# Patient Record
Sex: Female | Born: 1945 | Race: White | Hispanic: No | Smoking: Former smoker
Health system: Southern US, Community
[De-identification: ages and names within clinical notes are randomized; demographics above are authoritative.]

## PROBLEM LIST (undated history)

## (undated) DIAGNOSIS — E785 Hyperlipidemia, unspecified: Secondary | ICD-10-CM

## (undated) DIAGNOSIS — H409 Unspecified glaucoma: Secondary | ICD-10-CM

## (undated) DIAGNOSIS — J45909 Unspecified asthma, uncomplicated: Secondary | ICD-10-CM

## (undated) DIAGNOSIS — G4733 Obstructive sleep apnea (adult) (pediatric): Secondary | ICD-10-CM

## (undated) DIAGNOSIS — J449 Chronic obstructive pulmonary disease, unspecified: Secondary | ICD-10-CM

## (undated) DIAGNOSIS — M810 Age-related osteoporosis without current pathological fracture: Secondary | ICD-10-CM

## (undated) DIAGNOSIS — K219 Gastro-esophageal reflux disease without esophagitis: Secondary | ICD-10-CM

## (undated) DIAGNOSIS — I10 Essential (primary) hypertension: Secondary | ICD-10-CM

## (undated) DIAGNOSIS — R011 Cardiac murmur, unspecified: Secondary | ICD-10-CM

## (undated) DIAGNOSIS — H3530 Unspecified macular degeneration: Secondary | ICD-10-CM

## (undated) HISTORY — DX: Hyperlipidemia, unspecified: E78.5

## (undated) HISTORY — PX: GLAUCOMA SURGERY: SHX656

## (undated) HISTORY — PX: NASAL SINUS SURGERY: SHX719

## (undated) HISTORY — DX: Essential (primary) hypertension: I10

## (undated) HISTORY — DX: Unspecified glaucoma: H40.9

## (undated) HISTORY — PX: VESICOVAGINAL FISTULA CLOSURE W/ TAH: SUR271

## (undated) HISTORY — DX: Obstructive sleep apnea (adult) (pediatric): G47.33

## (undated) HISTORY — PX: ABDOMINAL HYSTERECTOMY: SHX81

## (undated) HISTORY — PX: BREAST CYST ASPIRATION: SHX578

---

## 2004-05-11 ENCOUNTER — Other Ambulatory Visit: Payer: Self-pay

## 2004-08-27 ENCOUNTER — Ambulatory Visit: Payer: Self-pay | Admitting: General Surgery

## 2005-04-09 ENCOUNTER — Ambulatory Visit: Payer: Self-pay | Admitting: Family Medicine

## 2005-10-15 ENCOUNTER — Ambulatory Visit: Payer: Self-pay | Admitting: Unknown Physician Specialty

## 2006-01-29 ENCOUNTER — Other Ambulatory Visit: Payer: Self-pay

## 2006-02-06 ENCOUNTER — Ambulatory Visit: Payer: Self-pay | Admitting: Otolaryngology

## 2006-05-16 ENCOUNTER — Ambulatory Visit: Payer: Self-pay | Admitting: Family Medicine

## 2006-10-23 ENCOUNTER — Ambulatory Visit: Payer: Self-pay | Admitting: Internal Medicine

## 2007-04-18 ENCOUNTER — Emergency Department: Payer: Self-pay | Admitting: Emergency Medicine

## 2007-06-01 ENCOUNTER — Ambulatory Visit: Payer: Self-pay | Admitting: Family Medicine

## 2008-06-02 ENCOUNTER — Ambulatory Visit: Payer: Self-pay | Admitting: Family Medicine

## 2009-01-26 DIAGNOSIS — I10 Essential (primary) hypertension: Secondary | ICD-10-CM | POA: Insufficient documentation

## 2009-01-26 DIAGNOSIS — R05 Cough: Secondary | ICD-10-CM | POA: Insufficient documentation

## 2009-01-26 DIAGNOSIS — R059 Cough, unspecified: Secondary | ICD-10-CM | POA: Insufficient documentation

## 2009-01-27 ENCOUNTER — Ambulatory Visit: Payer: Self-pay | Admitting: Internal Medicine

## 2009-01-31 ENCOUNTER — Ambulatory Visit: Payer: Self-pay | Admitting: Unknown Physician Specialty

## 2009-03-20 ENCOUNTER — Ambulatory Visit: Payer: Self-pay | Admitting: Internal Medicine

## 2009-03-20 DIAGNOSIS — R0989 Other specified symptoms and signs involving the circulatory and respiratory systems: Secondary | ICD-10-CM

## 2009-03-20 DIAGNOSIS — R0609 Other forms of dyspnea: Secondary | ICD-10-CM | POA: Insufficient documentation

## 2009-03-21 ENCOUNTER — Telehealth (INDEPENDENT_AMBULATORY_CARE_PROVIDER_SITE_OTHER): Payer: Self-pay | Admitting: *Deleted

## 2009-04-27 ENCOUNTER — Ambulatory Visit: Payer: Self-pay | Admitting: Internal Medicine

## 2009-04-27 DIAGNOSIS — K219 Gastro-esophageal reflux disease without esophagitis: Secondary | ICD-10-CM | POA: Insufficient documentation

## 2009-06-08 ENCOUNTER — Ambulatory Visit: Payer: Self-pay | Admitting: Family Medicine

## 2010-06-12 ENCOUNTER — Ambulatory Visit: Payer: Self-pay | Admitting: Family Medicine

## 2010-07-04 ENCOUNTER — Ambulatory Visit: Payer: Self-pay | Admitting: Family Medicine

## 2010-07-31 ENCOUNTER — Other Ambulatory Visit: Payer: Self-pay

## 2010-08-01 ENCOUNTER — Other Ambulatory Visit: Payer: Self-pay

## 2010-08-07 ENCOUNTER — Ambulatory Visit: Payer: Self-pay | Admitting: Unknown Physician Specialty

## 2010-10-04 ENCOUNTER — Ambulatory Visit: Payer: Self-pay | Admitting: Family Medicine

## 2011-01-21 IMAGING — CR DG LUMBAR SPINE COMPLETE 4+V
1 series · 5 of 5 positions shown · non-contrast
Comparison: none

REASON FOR EXAM: low back pain left hip pain
COMMENTS:

[Series 2: view not recorded · 0.17mm/px · 5 of 5 slices shown]
[im 1/5]
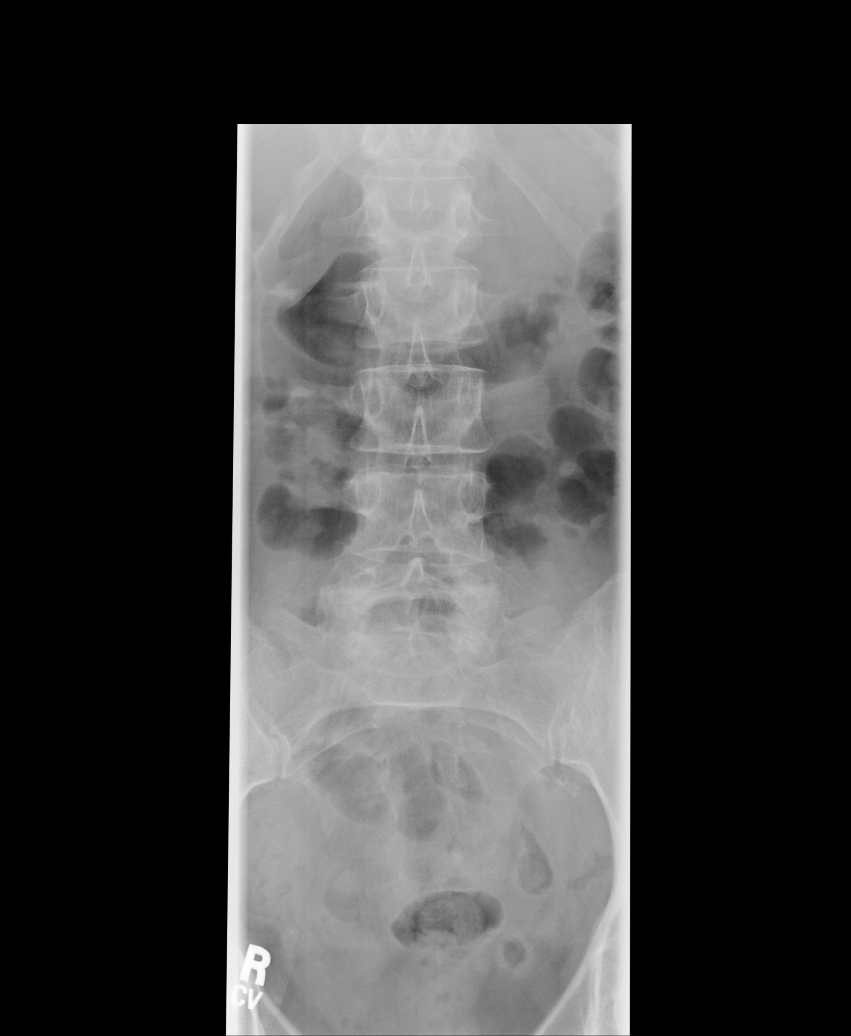
[im 2/5]
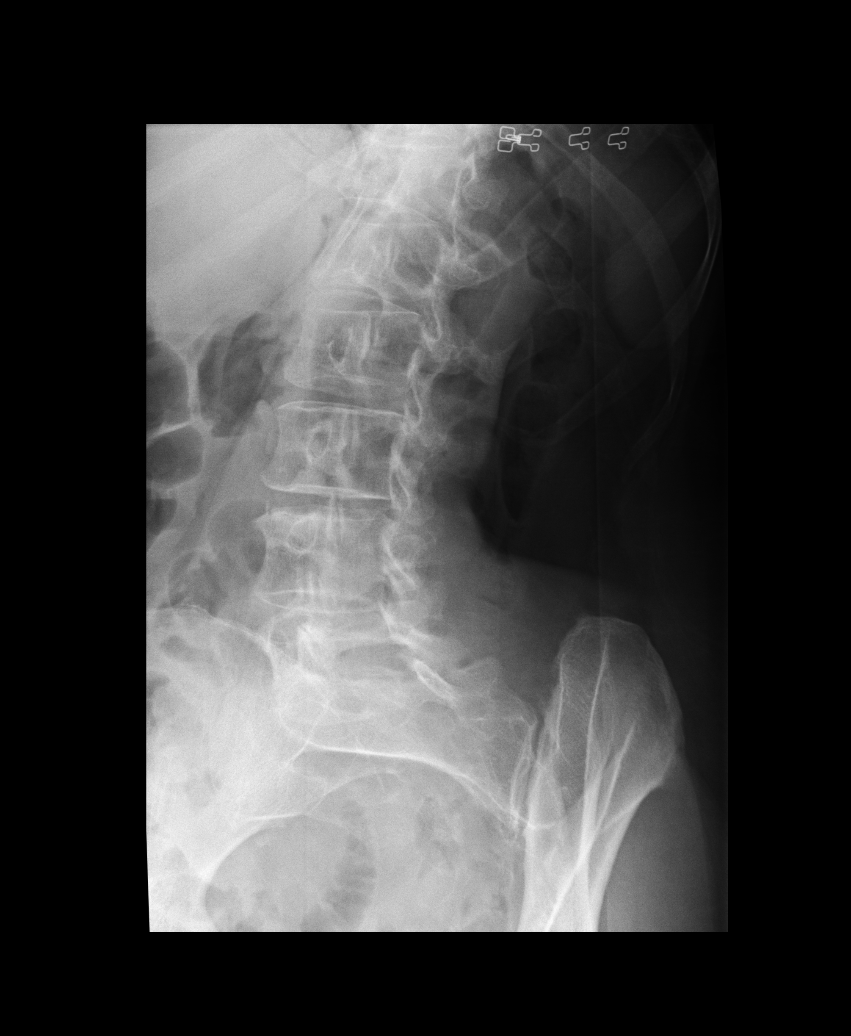
[im 3/5]
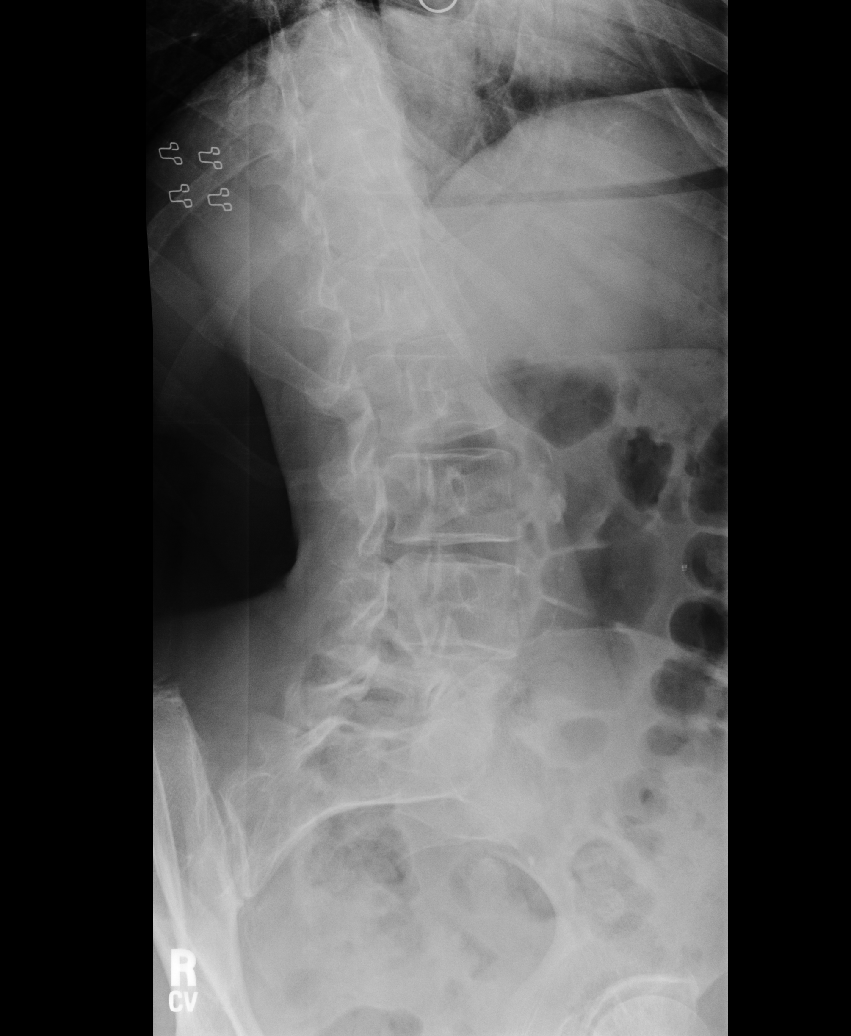
[im 4/5]
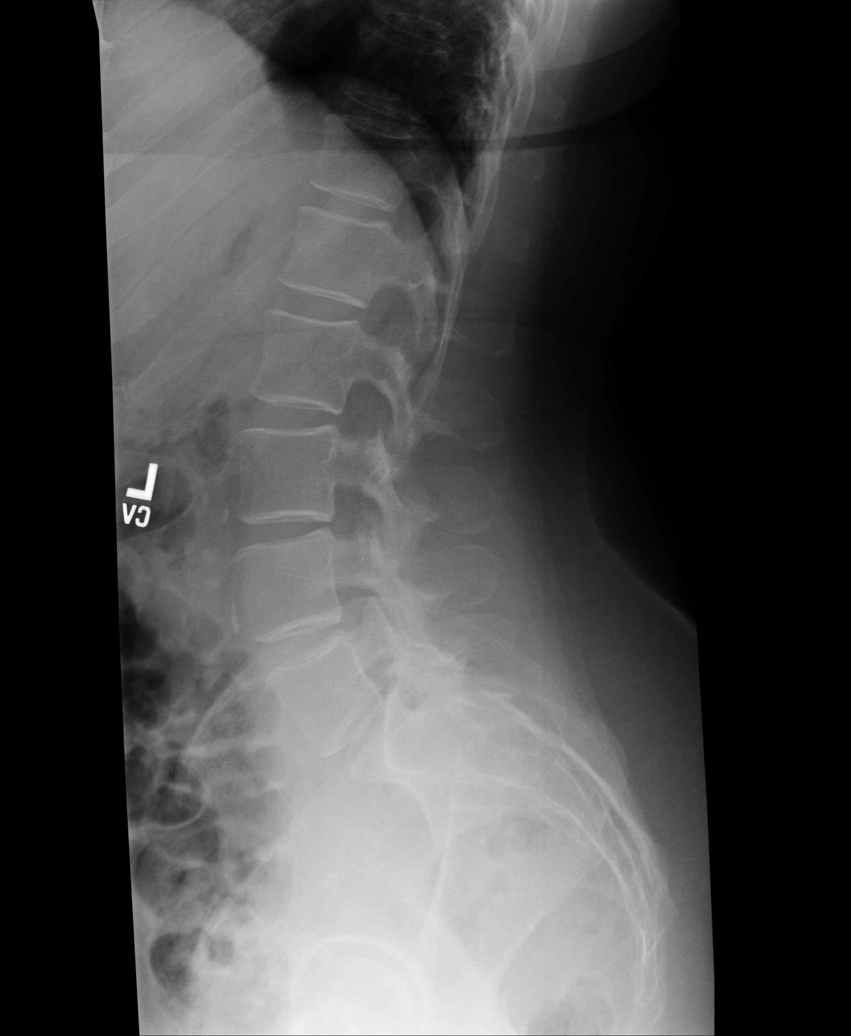
[im 5/5]
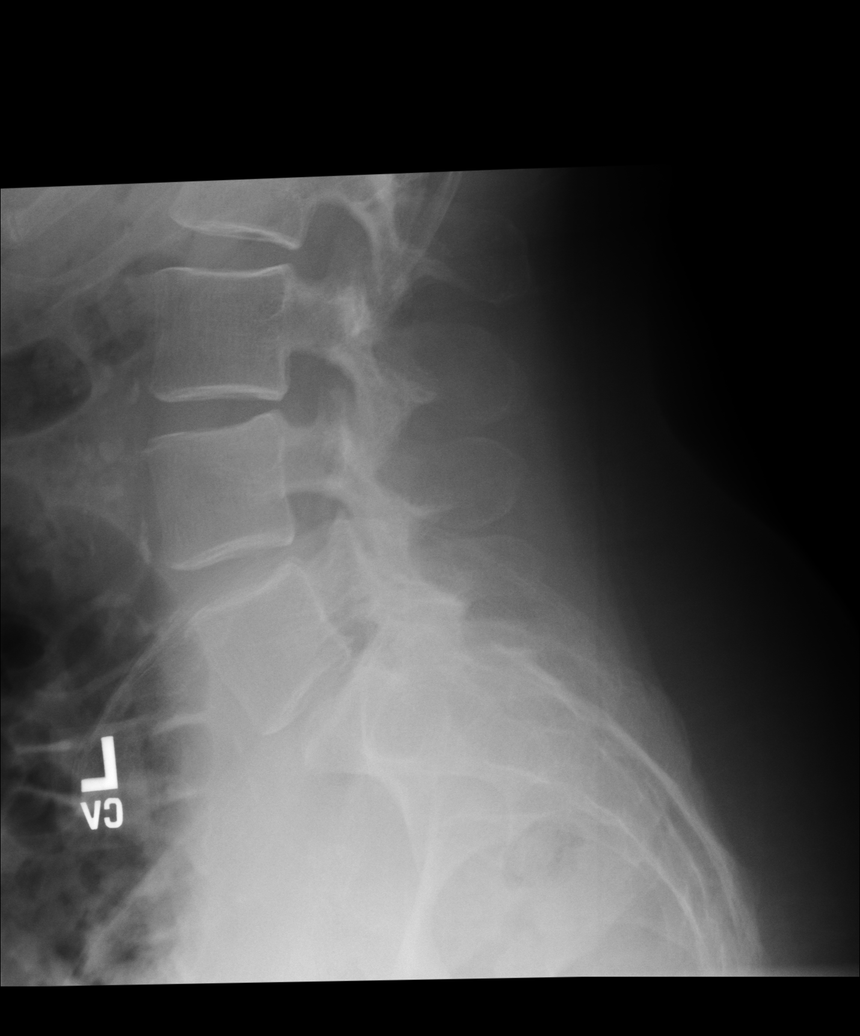

[5 of 5 positions shown; findings below may reference images not displayed]

PROCEDURE:     KDR - KDXR LUMBAR SPINE WITH OBLIQUES  - July 04, 2010 [DATE]

RESULT:     The vertebral body heights and the intervertebral disc spaces
are well maintained. Vertebral body alignment is normal. Oblique view shows
no significant abnormalities of the articular facets. The pedicles are
bilaterally intact.
IMPRESSION: No acute changes are identified.

## 2011-03-22 ENCOUNTER — Ambulatory Visit: Payer: Self-pay | Admitting: Otolaryngology

## 2011-04-05 NOTE — Assessment & Plan Note (Signed)
[facility]                             PULMONARY OFFICE NOTE   Sonya Gaines, Sonya Gaines                        MRN:          [id number]  DATE:10/23/2006                            DOB:          1946/06/01    HISTORY:  A 65 year old white female who quit smoking in 1992 and denies  having any respiratory complaints and a weight of 125 pounds.  However,  over the last 5 years she has had persistent cough on almost a daily  basis, worse in the mornings, productive of several teaspoons to a  tablespoon of green sputum that only improves a little bit from  antibiotics, and she is becoming intolerant to multiple antibiotics at  this point.  The most recent antibiotic she took was clindamycin; she  said which cleared up her head, but not her lungs.  When she is not  coughing she denies any significant dyspnea.  She rarely wakes up at  night with any cough or subjective wheeze.  She does notice the cough is  worse with weather changes when she is exposed to heavy fumes such as  perfume.   She denies any overt reflux symptoms, but has been told she might have  occult reflux, and has been taking Nexium for this without any apparent  benefit.   PAST MEDICAL HISTORY:  Significant for hypertension (note, not on ACE  inhibitors), multiple sinus surgeries, the most recent in 2006, and  hysterectomy in 1975.   ALLERGIES:  1. BIAXIN.  2. LEVAQUIN.  3. PENICILLIN.  4. SULFA.  None of these were specific reactions that she remember. She says she  has been able to tolerate Tequin, but it quit working after a while.   MEDICATIONS:  Nasacort, Benicar, Zetia, Nexium, Evista, vitamin E,  calcium, Advair, Fosamax.  For a full inventory with dosing, please see  face dated October 23, 2006.   SOCIAL HISTORY:  She quit smoking in 1992.  She denied any respiratory  complaints then.  She is retired and has no unusual travel, hobby, or  exposure history.   FAMILY HISTORY:   Family history is recorded in detail, and significant  for asthma in her brother, emphysema in her father, who was a smoker.   REVIEW OF SYSTEMS:  Taken in detail on the worksheet, and significant  for problems outlined above.   PHYSICAL EXAMINATION:  This is a pleasant ambulatory white female who  prefers to answer questions with medical diagnosis rather than specific  symptoms.  HEENT:  Unremarkable.  Oropharynx is clear.  Nose:  There is no  excessive postnasal drainage or cobblestoning.  There is minimal  turbinate edema.  Ear canals and tympanic membranes are clear.  NECK:  Supple without cervical adenopathy or tenderness.  Trachea is  midline.  No lymphadenopathy.  LUNG FIELDS:  Reveal prominent pseudo wheeze with junky rhonchi on FVC  maneuver only.  There is a regular rhythm without murmur, gallop or rub  present.  ABDOMEN:  Soft and benign with no palpable organomegaly, masses or  tenderness.  EXTREMITIES:  Warm without calf tenderness,  cyanosis or clubbing.   Chest x-ray is pending.   IMPRESSION:  Chronic cough associated typically exacerbated in the  morning with persistent purulent sputum.  It is suggestive of a  mucociliary dysfunction with or without sinusitis with postnasal drip  syndrome.  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   To sort through the differential, I recommended the following:  1. First increase Nasacort to b.i.d. to reduce nasal inflammation.  2. Continue to take Nexium, but take it 30 to 60 minutes before meals      and follow a GERD diet strictly to see to what extent reflux is      playing a  roll in terms of any of her symptoms.  For the same      reason I would like her to stop Fosamax for the next 6 weeks and      stop Advair also (Advair may be helping the lower airways in terms      of inflammation, but may irritate the upper airways and make them      more sensitive to what would otherwise be unremarkable postnasal      drainage.)   To replace Advair, I spent time teaching her how to use Symbicort 84/4  mg and asked her to take 2 puffs b.i.d.   Because she has green sputum, I recommended Avelox for 5 days only, and  call me back if the green sputum is still there.  Because she has  coughing and rattling suggestive of acquired mucociliary dysfunction, I  recommended a mucolytic in the form of Mucinex DM 2 b.i.d. Follow up  will be in 6 weeks, sooner if needed.     Charlaine Dalton. Sherene Sires, MD, [facility]  Electronically Signed    MBW/MedQ  DD: 10/23/2006  DT: 10/23/2006  Job #: [id number]   cc:   Gertie Baron, MD

## 2011-06-19 ENCOUNTER — Ambulatory Visit: Payer: Self-pay | Admitting: Otolaryngology

## 2011-08-06 ENCOUNTER — Ambulatory Visit: Payer: Self-pay | Admitting: Family Medicine

## 2012-10-12 ENCOUNTER — Ambulatory Visit: Payer: Self-pay | Admitting: Internal Medicine

## 2013-09-27 ENCOUNTER — Ambulatory Visit: Payer: Self-pay | Admitting: Unknown Physician Specialty

## 2013-10-13 ENCOUNTER — Ambulatory Visit: Payer: Self-pay | Admitting: Internal Medicine

## 2013-10-28 DIAGNOSIS — I10 Essential (primary) hypertension: Secondary | ICD-10-CM | POA: Diagnosis present

## 2014-12-27 ENCOUNTER — Ambulatory Visit: Payer: Self-pay | Admitting: Internal Medicine

## 2015-01-25 ENCOUNTER — Ambulatory Visit (INDEPENDENT_AMBULATORY_CARE_PROVIDER_SITE_OTHER)
Admission: RE | Admit: 2015-01-25 | Discharge: 2015-01-25 | Disposition: A | Discharge status: Home / Self Care | Payer: Medicare PPO | Source: Ambulatory Visit | Attending: Internal Medicine | Admitting: Internal Medicine

## 2015-01-25 ENCOUNTER — Ambulatory Visit (INDEPENDENT_AMBULATORY_CARE_PROVIDER_SITE_OTHER): Payer: Medicare PPO | Admitting: Internal Medicine

## 2015-01-25 ENCOUNTER — Encounter (INDEPENDENT_AMBULATORY_CARE_PROVIDER_SITE_OTHER): Payer: Self-pay

## 2015-01-25 ENCOUNTER — Encounter: Payer: Self-pay | Admitting: Internal Medicine

## 2015-01-25 VITALS — BP 140/70 | HR 70 | Ht 62.0 in | Wt 121.0 lb

## 2015-01-25 DIAGNOSIS — J45991 Cough variant asthma: Secondary | ICD-10-CM

## 2015-01-25 MED ORDER — BUDESONIDE-FORMOTEROL FUMARATE 80-4.5 MCG/ACT IN AERO: INHALATION_SPRAY | RESPIRATORY_TRACT | Status: DC

## 2015-01-25 NOTE — Progress Notes (Signed)
Subjective:    Patient ID: Sonya Gaines, female    DOB: 05/01/1946,    MRN: [id number]  HPI  58 yowf   quit smoking in 1992 with no problems at that time but chronic daily cough started around 2002.    10/23/06 initial pulmonary eval dx uacs vs asthma rec stop advair try symbicort rx ppi does not recall improvement and no follow up because she thought the problem was all sinus related and f/u with ENT.  January 27, 2009 ov for cough daily while on advair (did help some) worst first thing in am with mucus which is clear x tsp or two, occ slt yellow never bloody wiht mild doe but tires easily and not aerobic. rec nexium Take one 30-60 min before first meal of the day  stop lisinopril take Diovan 160 one daily  cough went away but wheezing came back on advair and continue some every am but doesn't wake her up prematurely.   Mar 20, 2009 some better overall but wheeze and sob continue on advair so changed to symbicort 160  April 27, 2009 ov doing so much better decreased symbicort to 160 1-2 two times a day as needed. no limiting sob, no cough. rec   01/25/2015 1st [facility] office visit [facility]/ Kipper Buch   Chief Complaint  Patient presents with  . Follow-up    Pt last seen here June 2010.  Pt c/o increased wheezing and cough for the past year.  She states occ produces some clear to yellow sputum.    does fine when takes symbicort, has cough / wheeze/congestion and   when she misses a lot of doses Concerned about glaucoma effects of symbicort.  No obvious other patterns in day to day or daytime variabilty or assoc chronic cough or cp or chest tightness, subjective wheeze overt sinus or hb symptoms. No unusual exp hx or h/o childhood pna/ asthma or knowledge of premature birth.  Sleeping ok without nocturnal  or early am exacerbation  of respiratory  c/o's or need for noct saba. Also denies any obvious fluctuation of symptoms with weather or environmental changes or other  aggravating or alleviating factors except as outlined above   Current Medications, Allergies, Complete Past Medical History, Past Surgical History, Family History, and Social History were reviewed in [employer] record.               Past Medical History: HYPERTENSION (ICD-401.9)  - ACE d/c 01/27/09 (cough) COUGH, CHRONIC (ICD-786.2) ASTHMA  - PFT's nl after one puff symbicort 160 April 27, 2009   - HFA 50% April 27, 2009   Family History  Asthma- Brother Emphysema- Father (who was a smoker)  Social History: Reviewed history from 01/26/2009 and no changes required. Former smoker. Quit in 1992. Retired                       Review of Systems  Constitutional: Negative for fever, chills and unexpected weight change.  HENT: Positive for congestion. Negative for dental problem, ear pain, nosebleeds, postnasal drip, rhinorrhea, sinus pressure, sneezing, sore throat, trouble swallowing and voice change.   Eyes: Negative for visual disturbance.  Respiratory: Positive for cough and shortness of breath. Negative for choking.   Cardiovascular: Negative for chest pain and leg swelling.  Gastrointestinal: Negative for vomiting, abdominal pain and diarrhea.  Genitourinary: Negative for difficulty urinating.  Musculoskeletal: Negative for arthralgias.  Skin: Negative for rash.  Neurological: Negative for  tremors, syncope and headaches.  Hematological: Does not bruise/bleed easily.       Objective:   Physical Exam  Additional Exam: wt 152 January 27, 2009 > 153 April 27, 2009> 01/25/15 121   In general healthy appearing  / slt hoarse HEENT: nl dentition, turbinates, and orophanx. Nl external ear canals without cough reflex NECK : without JVD/Nodes/TM/ nl carotid upstrokes bilaterally LUNGS: no acc muscle use, trace bilateral exp rhonchi worse on FVC maneuver  CV: RRR no s3 or murmur or increase in P2, no edema . ABD: soft  and nontender with nl excursion in the supine position. No bruits or organomegaly, bowel sounds nl MS: warm without deformities, calf tenderness, cyanosis or clubbing SKIN: warm and dry without lesions    CXR PA and Lateral:   01/25/2015 :     I personally reviewed images and agree with radiology impression as follows:     No acute cardiopulmonary disease Assessment & Plan:

## 2015-01-25 NOTE — Patient Instructions (Signed)
symbicort 80 Take 2 puffs first thing in am and then another 2 puffs about 12 hours later.   GERD (REFLUX)  is an extremely common cause of respiratory symptoms just like yours , many times with no obvious heartburn at all.    It can be treated with medication, but also with lifestyle changes including avoidance of late meals, excessive alcohol, smoking cessation, and avoid fatty foods, chocolate, peppermint, colas, red wine, and acidic juices such as orange juice.  NO MINT OR MENTHOL PRODUCTS SO NO COUGH DROPS  USE SUGARLESS CANDY INSTEAD (Jolley ranchers or Stover's or Life Savers) or even ice chips will also do - the key is to swallow to prevent all throat clearing. NO OIL BASED VITAMINS - use powdered substitutes.    Please remember to go to the   x-ray department downstairs for your tests - we will call you with the results when they are available.  Please schedule a follow up office visit in 6 weeks, call sooner if needed with pfts

## 2015-01-25 NOTE — Progress Notes (Signed)
Quick Note:  LMTCB ______ 

## 2015-01-26 ENCOUNTER — Telehealth: Payer: Self-pay | Admitting: Internal Medicine

## 2015-01-26 ENCOUNTER — Encounter: Payer: Self-pay | Admitting: Internal Medicine

## 2015-01-26 NOTE — Telephone Encounter (Signed)
Pt informed of cxr results per Dr Melvyn Novas

## 2015-01-26 NOTE — Assessment & Plan Note (Signed)
We know her pfts were previously nl on symbiocort so this is not copd but rather low grade chronic asthma that has been difficult to control  DDX of  difficult airways management all start with A and  include Adherence, Ace Inhibitors, Acid Reflux, Active Sinus Disease, Alpha 1 Antitripsin deficiency, Anxiety masquerading as Airways dz,  ABPA,  allergy(esp in young), Aspiration (esp in elderly), Adverse effects of DPI,  Active smokers, plus two Bs  = Bronchiectasis and Beta blocker use..and one C= CHF  Adherence is always the initial "prime suspect" and is a multilayered concern that requires a "trust but verify" approach in every patient - starting with knowing how to use medications, especially inhalers, correctly, keeping up with refills and understanding the fundamental difference between maintenance and prns vs those medications only taken for a very short course and then stopped and not refilled.  .-  The proper method of use, as well as anticipated side effects, of a metered-dose inhaler are discussed and demonstrated to the patient. Improved effectiveness after extensive coaching during this visit to a level of approximately  75 % so try symbicort 80 2bid   ? Acid (or non-acid) GERD > always difficult to exclude as up to 75% of pts in some series report no assoc GI/ Heartburn symptoms> she is on fosfamax and ppi which may be problematic and if develops refractory cough/ hoarseness would consider reclast IV yearly here/ reviewed diet   ? BB effect > on timolol eyedrops, a potential concern  Discussed in detail all the  indications, usual  risks and alternatives  relative to the benefits with patient who agrees to rx a follows:  For now will leave her on the symbicort 80 2bid and recheck pfts

## 2015-03-09 ENCOUNTER — Ambulatory Visit: Payer: Medicare PPO | Admitting: Internal Medicine

## 2015-11-11 ENCOUNTER — Emergency Department
Admission: EM | Admit: 2015-11-11 | Discharge: 2015-11-11 | Disposition: A | Discharge status: Home / Self Care | Payer: Medicare PPO | Attending: Emergency Medicine | Admitting: Emergency Medicine

## 2015-11-11 ENCOUNTER — Encounter: Payer: Self-pay | Admitting: Emergency Medicine

## 2015-11-11 DIAGNOSIS — J069 Acute upper respiratory infection, unspecified: Secondary | ICD-10-CM | POA: Diagnosis not present

## 2015-11-11 DIAGNOSIS — Z880 Allergy status to penicillin: Secondary | ICD-10-CM | POA: Diagnosis not present

## 2015-11-11 DIAGNOSIS — Z79899 Other long term (current) drug therapy: Secondary | ICD-10-CM | POA: Diagnosis not present

## 2015-11-11 DIAGNOSIS — R05 Cough: Secondary | ICD-10-CM | POA: Diagnosis present

## 2015-11-11 DIAGNOSIS — Z791 Long term (current) use of non-steroidal anti-inflammatories (NSAID): Secondary | ICD-10-CM | POA: Diagnosis not present

## 2015-11-11 DIAGNOSIS — I10 Essential (primary) hypertension: Secondary | ICD-10-CM | POA: Diagnosis not present

## 2015-11-11 DIAGNOSIS — J029 Acute pharyngitis, unspecified: Secondary | ICD-10-CM | POA: Diagnosis not present

## 2015-11-11 DIAGNOSIS — Z87891 Personal history of nicotine dependence: Secondary | ICD-10-CM | POA: Insufficient documentation

## 2015-11-11 LAB — POCT RAPID STREP A: Streptococcus, Group A Screen (Direct): NEGATIVE

## 2015-11-11 MED ORDER — CEPHALEXIN 500 MG PO CAPS: 500.0000 mg | ORAL_CAPSULE | Freq: Four times a day (QID) | ORAL | Status: AC

## 2015-11-11 NOTE — ED Notes (Signed)
C/o cough congestion and sore throat x 2 days

## 2015-11-11 NOTE — Discharge Instructions (Signed)
Pharyngitis °Pharyngitis is redness, pain, and swelling (inflammation) of your pharynx.  °CAUSES  °Pharyngitis is usually caused by infection. Most of the time, these infections are from viruses (viral) and are part of a cold. However, sometimes pharyngitis is caused by bacteria (bacterial). Pharyngitis can also be caused by allergies. Viral pharyngitis may be spread from person to person by coughing, sneezing, and personal items or utensils (cups, forks, spoons, toothbrushes). Bacterial pharyngitis may be spread from person to person by more intimate contact, such as kissing.  °SIGNS AND SYMPTOMS  °Symptoms of pharyngitis include:   °· Sore throat.   °· Tiredness (fatigue).   °· Low-grade fever.   °· Headache. °· Joint pain and muscle aches. °· Skin rashes. °· Swollen lymph nodes. °· Plaque-like film on throat or tonsils (often seen with bacterial pharyngitis). °DIAGNOSIS  °Your health care provider will ask you questions about your illness and your symptoms. Your medical history, along with a physical exam, is often all that is needed to diagnose pharyngitis. Sometimes, a rapid strep test is done. Other lab tests may also be done, depending on the suspected cause.  °TREATMENT  °Viral pharyngitis will usually get better in 3-4 days without the use of medicine. Bacterial pharyngitis is treated with medicines that kill germs (antibiotics).  °HOME CARE INSTRUCTIONS  °· Drink enough water and fluids to keep your urine clear or pale yellow.   °· Only take over-the-counter or prescription medicines as directed by your health care provider:   °· If you are prescribed antibiotics, make sure you finish them even if you start to feel better.   °· Do not take aspirin.   °· Get lots of rest.   °· Gargle with 8 oz of salt water (½ tsp of salt per 1 qt of water) as often as every 1-2 hours to soothe your throat.   °· Throat lozenges (if you are not at risk for choking) or sprays may be used to soothe your throat. °SEEK MEDICAL  CARE IF:  °· You have large, tender lumps in your neck. °· You have a rash. °· You cough up green, yellow-brown, or bloody spit. °SEEK IMMEDIATE MEDICAL CARE IF:  °· Your neck becomes stiff. °· You drool or are unable to swallow liquids. °· You vomit or are unable to keep medicines or liquids down. °· You have severe pain that does not go away with the use of recommended medicines. °· You have trouble breathing (not caused by a stuffy nose). °MAKE SURE YOU:  °· Understand these instructions. °· Will watch your condition. °· Will get help right away if you are not doing well or get worse. °  °This information is not intended to replace advice given to you by your health care provider. Make sure you discuss any questions you have with your health care provider. °  °Document Released: 11/04/2005 Document Revised: 08/25/2013 Document Reviewed: 07/12/2013 °Elsevier Interactive Patient Education ©2016 Else[employer] Inc[employer] °Upper Respiratory Infection, Adult °Most upper respiratory infections (URIs) are a viral infection of the air passages leading to the lungs. A URI affects the nose, throat, and upper air passages. The most common type of URI is nasopharyngitis and is typically referred to as "the common cold." °URIs run their course and usually go away on their own. Most of the time, a URI does not require medical attention, but sometimes a bacterial infection in the upper airways can follow a viral infection. This is called a secondary infection. Sinus and middle ear infections are common types of secondary upper respiratory infections. °Bacterial pneumonia   can also complicate a URI. A URI can worsen asthma and chronic obstructive pulmonary disease (COPD). Sometimes, these complications can require emergency medical care and may be life threatening.  °CAUSES °Almost all URIs are caused by viruses. A virus is a type of germ and can spread from one person to another.  °RISKS FACTORS °You may be at risk for a URI if:  °· You  smoke.   °· You have chronic heart or lung disease. °· You have a weakened defense (immune) system.   °· You are very young or very old.   °· You have nasal allergies or asthma. °· You work in crowded or poorly ventilated areas. °· You work in health care facilities or schools. °SIGNS AND SYMPTOMS  °Symptoms typically develop 2-3 days after you come in contact with a cold virus. Most viral URIs last 7-10 days. However, viral URIs from the influenza virus (flu virus) can last 14-18 days and are typically more severe. Symptoms may include:  °· Runny or stuffy (congested) nose.   °· Sneezing.   °· Cough.   °· Sore throat.   °· Headache.   °· Fatigue.   °· Fever.   °· Loss of appetite.   °· Pain in your forehead, behind your eyes, and over your cheekbones (sinus pain). °· Muscle aches.   °DIAGNOSIS  °Your health care provider may diagnose a URI by: °· Physical exam. °· Tests to check that your symptoms are not due to another condition such as: °¨ Strep throat. °¨ Sinusitis. °¨ Pneumonia. °¨ Asthma. °TREATMENT  °A URI goes away on its own with time. It cannot be cured with medicines, but medicines may be prescribed or recommended to relieve symptoms. Medicines may help: °· Reduce your fever. °· Reduce your cough. °· Relieve nasal congestion. °HOME CARE INSTRUCTIONS  °· Take medicines only as directed by your health care provider.   °· Gargle warm saltwater or take cough drops to comfort your throat as directed by your health care provider. °· Use a warm mist humidifier or inhale steam from a shower to increase air moisture. This may make it easier to breathe. °· Drink enough fluid to keep your urine clear or pale yellow.   °· Eat soups and other clear broths and maintain good nutrition.   °· Rest as needed.   °· Return to work when your temperature has returned to normal or as your health care provider advises. You may need to stay home longer to avoid infecting others. You can also use a face mask and careful hand  washing to prevent spread of the virus. °· Increase the usage of your inhaler if you have asthma.   °· Do not use any tobacco products, including cigarettes, chewing tobacco, or electronic cigarettes. If you need help quitting, ask your health care provider. °PREVENTION  °The best way to protect yourself from getting a cold is to practice good hygiene.  °· Avoid oral or hand contact with people with cold symptoms.   °· Wash your hands often if contact occurs.   °There is no clear evidence that vitamin C, vitamin E, echinacea, or exercise reduces the chance of developing a cold. However, it is always recommended to get plenty of rest, exercise, and practice good nutrition.  °SEEK MEDICAL CARE IF:  °· You are getting worse rather than better.   °· Your symptoms are not controlled by medicine.   °· You have chills. °· You have worsening shortness of breath. °· You have brown or red mucus. °· You have yellow or brown nasal discharge. °· You have pain in your face, especially   when you bend forward. °· You have a fever. °· You have swollen neck glands. °· You have pain while swallowing. °· You have white areas in the back of your throat. °SEEK IMMEDIATE MEDICAL CARE IF:  °· You have severe or persistent: °¨ Headache. °¨ Ear pain. °¨ Sinus pain. °¨ Chest pain. °· You have chronic lung disease and any of the following: °¨ Wheezing. °¨ Prolonged cough. °¨ Coughing up blood. °¨ A change in your usual mucus. °· You have a stiff neck. °· You have changes in your: °¨ Vision. °¨ Hearing. °¨ Thinking. °¨ Mood. °MAKE SURE YOU:  °· Understand these instructions. °· Will watch your condition. °· Will get help right away if you are not doing well or get worse. °  °This information is not intended to replace advice given to you by your health care provider. Make sure you discuss any questions you have with your health care provider. °  °Document Released: 04/30/2001 Document Revised: 03/21/2015 Document Reviewed: 02/09/2014 °Elsevier  Interactive Patient Education ©2016 [employer] Inc. ° °

## 2015-11-11 NOTE — ED Provider Notes (Signed)
[facility] Emergency Department Provider Note  ____________________________________________  Time seen: Approximately 8:50 AM  I have reviewed the triage vital signs and the nursing notes.   HISTORY  Chief Complaint Cough and Sore Throat    HPI Sonya Gaines is a 69 y.o. female presents with cough congestion sore throat 2 days. Patient reports that she spent last week at [facility] around a lot of sick children. Patient states that the symptoms usually develop and turned into bronchitis.   Past Medical History  Diagnosis Date  . OSA (obstructive sleep apnea)     not on CPAP   . Hypertension   . Hyperlipidemia   . Glaucoma     Patient Active Problem List   Diagnosis Date Noted  . Cough variant asthma 01/25/2015  . GASTROESOPHAGEAL REFLUX DISEASE 04/27/2009  . DYSPNEA 03/20/2009  . HYPERTENSION 01/26/2009  . COUGH, CHRONIC 01/26/2009    Past Surgical History  Procedure Laterality Date  . Glaucoma surgery      x 2   . Vesicovaginal fistula closure w/ tah    . Nasal sinus surgery      x 3     Current Outpatient Rx  Name  Route  Sig  Dispense  Refill  . alendronate (FOSAMAX) 70 MG tablet   Oral   Take 70 mg by mouth once a week. Take with a full glass of water on an empty stomach.         . budesonide-formoterol (SYMBICORT) 80-4.5 MCG/ACT inhaler      Take 2 puffs first thing in am and then another 2 puffs about 12 hours later.   1 Inhaler   12   . calcium carbonate (TUMS - DOSED IN MG ELEMENTAL CALCIUM) 500 MG chewable tablet   Oral   Chew 1 tablet by mouth as needed for indigestion or heartburn.         . cephALEXin (KEFLEX) 500 MG capsule   Oral   Take 1 capsule (500 mg total) by mouth 4 (four) times daily.   40 capsule   0   . dorzolamide-timolol (COSOPT) 22.3-6.8 MG/ML ophthalmic solution   Both Eyes   Place 1 drop into both eyes 2 (two) times daily.         Marland Kitchen ezetimibe (ZETIA) 10 MG tablet   Oral   Take 10  mg by mouth daily.         Marland Kitchen latanoprost (XALATAN) 0.005 % ophthalmic solution   Both Eyes   Place 1 drop into both eyes at bedtime.         Marland Kitchen losartan (COZAAR) 50 MG tablet   Oral   Take 50 mg by mouth daily.         . meloxicam (MOBIC) 7.5 MG tablet   Oral   Take 7.5 mg by mouth daily.         . Multiple Vitamins-Minerals (PRESERVISION AREDS) TABS   Oral   Take 1 tablet by mouth daily.         . Multiple Vitamins-Minerals (WOMENS 50+ MULTI VITAMIN/MIN) TABS   Oral   Take 1 tablet by mouth daily.         Marland Kitchen omeprazole (PRILOSEC) 20 MG capsule   Oral   Take 20 mg by mouth daily.           Allergies Brimonidine; Clarithromycin; Clindamycin/lincomycin; Levofloxacin; Penicillins; Statins; and Sulfonamide derivatives  Family History  Problem Relation Age of Onset  . Emphysema Father  smoked  . Heart disease Brother   . Heart disease Father   . Breast cancer Maternal Aunt   . Lung cancer Paternal Grandfather     never smoker  . Lung cancer Son     never smoker    Social History Social History  Substance Use Topics  . Smoking status: Former Smoker -- 1.50 packs/day for 30 years    Types: Cigarettes    Quit date: 11/19/1991  . Smokeless tobacco: Never Used  . Alcohol Use: No    Review of Systems Constitutional: No fever/chills Eyes: No visual changes. ENT: No sore throat. Cardiovascular: Denies chest pain. Respiratory: Denies shortness of breath. Positive for productive cough. Gastrointestinal: No abdominal pain.  No nausea, no vomiting.  No diarrhea.  No constipation. Genitourinary: Negative for dysuria. Musculoskeletal: Negative for back pain. Skin: Negative for rash. Neurological: Negative for headaches, focal weakness or numbness.  10-point ROS otherwise negative.  ____________________________________________   PHYSICAL EXAM:  VITAL SIGNS: ED Triage Vitals  Enc Vitals Group     BP 11/11/15 0836 165/94 mmHg     Pulse Rate  11/11/15 0836 81     Resp 11/11/15 0836 18     Temp 11/11/15 0836 98 F (36.7 C)     Temp Source 11/11/15 0836 Oral     SpO2 11/11/15 0836 98 %     Weight 11/11/15 0836 130 lb (58.968 kg)     Height 11/11/15 0836 5\' 2"  (1.575 m)     Head Cir --      Peak Flow --      Pain Score 11/11/15 0842 0     Pain Loc --      Pain Edu? --      Excl. in [place]? --     Constitutional: Alert and oriented. Well appearing and in no acute distress. Eyes: Conjunctivae are normal. PERRL. EOMI. Head: Atraumatic. Nose: No congestion/rhinnorhea. Mouth/Throat: Mucous membranes are moist.  Oropharynx non-erythematous. Neck: No stridor.   Cardiovascular: Normal rate, regular rhythm. Grossly normal heart sounds.  Good peripheral circulation. Respiratory: Normal respiratory effort.  No retractions. Lungs CTAB. Coarse breath sounds noted bilaterally. Gastrointestinal: Soft and nontender. No distention. No abdominal bruits. No CVA tenderness. Musculoskeletal: No lower extremity tenderness nor edema.  No joint effusions. Neurologic:  Normal speech and language. No gross focal neurologic deficits are appreciated. No gait instability. Skin:  Skin is warm, dry and intact. No rash noted. Psychiatric: Mood and affect are normal. Speech and behavior are normal.  ____________________________________________   LABS (all labs ordered are listed, but only abnormal results are displayed)  Labs Reviewed  POCT RAPID STREP A      PROCEDURES  Procedure(s) performed: None  Critical Care performed: No  ____________________________________________   INITIAL IMPRESSION / ASSESSMENT AND PLAN / ED COURSE  Pertinent labs & imaging results that were available during my care of the patient were reviewed by me and considered in my medical decision making (see chart for details).  Acute URI/pharyngitis.. The patient's medical history and allergy list were reviewed. Rx given for Keflex 500 mg 4 times a day #40. Patient  follow-up with PCP or return here with any worsening symptomology. ____________________________________________   FINAL CLINICAL IMPRESSION(S) / ED DIAGNOSES  Final diagnoses:  Acute URI  Acute pharyngitis, unspecified pharyngitis type      Arlyss Repress, PA-C 11/11/15 [id number]  Ahmed Prima, MD 11/12/15 [phone]

## 2016-04-17 ENCOUNTER — Emergency Department
Admission: EM | Admit: 2016-04-17 | Discharge: 2016-04-17 | Disposition: A | Discharge status: Home / Self Care | Payer: Medicare Other | Attending: Emergency Medicine | Admitting: Emergency Medicine

## 2016-04-17 ENCOUNTER — Encounter: Payer: Self-pay | Admitting: Emergency Medicine

## 2016-04-17 DIAGNOSIS — Z7951 Long term (current) use of inhaled steroids: Secondary | ICD-10-CM | POA: Insufficient documentation

## 2016-04-17 DIAGNOSIS — R21 Rash and other nonspecific skin eruption: Secondary | ICD-10-CM | POA: Diagnosis present

## 2016-04-17 DIAGNOSIS — Z87891 Personal history of nicotine dependence: Secondary | ICD-10-CM | POA: Diagnosis not present

## 2016-04-17 DIAGNOSIS — E785 Hyperlipidemia, unspecified: Secondary | ICD-10-CM | POA: Diagnosis not present

## 2016-04-17 DIAGNOSIS — Z79899 Other long term (current) drug therapy: Secondary | ICD-10-CM | POA: Insufficient documentation

## 2016-04-17 DIAGNOSIS — I10 Essential (primary) hypertension: Secondary | ICD-10-CM | POA: Diagnosis not present

## 2016-04-17 MED ORDER — CYPROHEPTADINE HCL 4 MG PO TABS: 4.0000 mg | ORAL_TABLET | Freq: Three times a day (TID) | ORAL | Status: DC | PRN

## 2016-04-17 MED ORDER — CLOTRIMAZOLE-BETAMETHASONE 1-0.05 % EX CREA: TOPICAL_CREAM | CUTANEOUS | Status: DC

## 2016-04-17 MED ORDER — HYDROXYZINE HCL 50 MG PO TABS: 50.0000 mg | ORAL_TABLET | Freq: Three times a day (TID) | ORAL | Status: DC | PRN

## 2016-04-17 MED ORDER — HYDROCORTISONE VALERATE 0.2 % EX OINT: TOPICAL_OINTMENT | CUTANEOUS | Status: DC

## 2016-04-17 NOTE — ED Provider Notes (Signed)
[facility] Emergency Department Provider Note   ____________________________________________  Time seen: Approximately 12:05 PM  I have reviewed the triage vital signs and the nursing notes.   HISTORY  Chief Complaint Rash    HPI Sonya Gaines is a 70 y.o. female patient present with one week of scattered erythematous macular lesions on her body which itching. The rash is confined to the upper and lower extremities, bilateral back, sparing the center anterior/ posterior thoracic area and abdomen. Patient stated there is intense itching. Patient states she had a surgery on 04/05/2016. Patient states she was started on antibiotics and prednisone. The antibiotics were eyedrops. Patient states she discontinued the antibiotic eye drops but continue taking the prednisone eyedrops. Patient states she got some mild relief using over-the-counter cortisone cream. Patient denies any fever or chills associated this complaint. Patient denies any new personal hygiene or laundry products. He denies any new food and drinks. Patient is taking Benadryl with only mild relief Past Medical History  Diagnosis Date  . OSA (obstructive sleep apnea)     not on CPAP   . Hypertension   . Hyperlipidemia   . Glaucoma     Patient Active Problem List   Diagnosis Date Noted  . Cough variant asthma 01/25/2015  . GASTROESOPHAGEAL REFLUX DISEASE 04/27/2009  . DYSPNEA 03/20/2009  . HYPERTENSION 01/26/2009  . COUGH, CHRONIC 01/26/2009    Past Surgical History  Procedure Laterality Date  . Glaucoma surgery      x 2   . Vesicovaginal fistula closure w/ tah    . Nasal sinus surgery      x 3     Current Outpatient Rx  Name  Route  Sig  Dispense  Refill  . tobramycin (TOBREX) 0.3 % ophthalmic ointment      1 application 3 (three) times daily.         Marland Kitchen alendronate (FOSAMAX) 70 MG tablet   Oral   Take 70 mg by mouth once a week. Take with a full glass of water on an empty  stomach.         . budesonide-formoterol (SYMBICORT) 80-4.5 MCG/ACT inhaler      Take 2 puffs first thing in am and then another 2 puffs about 12 hours later.   1 Inhaler   12   . calcium carbonate (TUMS - DOSED IN MG ELEMENTAL CALCIUM) 500 MG chewable tablet   Oral   Chew 1 tablet by mouth as needed for indigestion or heartburn.         . clotrimazole-betamethasone (LOTRISONE) cream      Apply to affected area 2 times daily   15 g   1   . cyproheptadine (PERIACTIN) 4 MG tablet   Oral   Take 1 tablet (4 mg total) by mouth 3 (three) times daily as needed for allergies.   30 tablet   0   . dorzolamide-timolol (COSOPT) 22.3-6.8 MG/ML ophthalmic solution   Both Eyes   Place 1 drop into both eyes 2 (two) times daily.         Marland Kitchen ezetimibe (ZETIA) 10 MG tablet   Oral   Take 10 mg by mouth daily.         Marland Kitchen latanoprost (XALATAN) 0.005 % ophthalmic solution   Both Eyes   Place 1 drop into both eyes at bedtime.         Marland Kitchen losartan (COZAAR) 50 MG tablet   Oral   Take 50 mg by  mouth daily.         . meloxicam (MOBIC) 7.5 MG tablet   Oral   Take 7.5 mg by mouth daily.         . Multiple Vitamins-Minerals (PRESERVISION AREDS) TABS   Oral   Take 1 tablet by mouth daily.         . Multiple Vitamins-Minerals (WOMENS 50+ MULTI VITAMIN/MIN) TABS   Oral   Take 1 tablet by mouth daily.         Marland Kitchen omeprazole (PRILOSEC) 20 MG capsule   Oral   Take 20 mg by mouth daily.           Allergies Brimonidine; Clarithromycin; Clindamycin/lincomycin; Levofloxacin; Penicillins; Polytrim; Statins; Sulfonamide derivatives; and Tobramycin  Family History  Problem Relation Age of Onset  . Emphysema Father     smoked  . Heart disease Brother   . Heart disease Father   . Breast cancer Maternal Aunt   . Lung cancer Paternal Grandfather     never smoker  . Lung cancer Son     never smoker    Social History Social History  Substance Use Topics  . Smoking status:  Former Smoker -- 1.50 packs/day for 30 years    Types: Cigarettes    Quit date: 11/19/1991  . Smokeless tobacco: Never Used  . Alcohol Use: No    Review of Systems Constitutional: No fever/chills Eyes: No visual changes. ENT: No sore throat. Cardiovascular: Denies chest pain. Respiratory: Denies shortness of breath. Gastrointestinal: No abdominal pain.  No nausea, no vomiting.  No diarrhea.  No constipation. Genitourinary: Negative for dysuria. Musculoskeletal: Negative for back pain. Skin: Positive  Neurological: Negative for headaches, focal weakness or numbness. 10-point ROS otherwise negative.  ____________________________________________   PHYSICAL EXAM:  VITAL SIGNS: ED Triage Vitals  Enc Vitals Group     BP 04/17/16 1144 171/87 mmHg     Pulse Rate 04/17/16 1144 85     Resp 04/17/16 1144 20     Temp 04/17/16 1144 98 F (36.7 C)     Temp Source 04/17/16 1144 Oral     SpO2 04/17/16 1144 100 %     Weight 04/17/16 1144 131 lb (59.421 kg)     Height 04/17/16 1144 5\' 2"  (1.575 m)     Head Cir --      Peak Flow --      Pain Score --      Pain Loc --      Pain Edu? --      Excl. in [place]? --     Constitutional: Alert and oriented. Well appearing and in no acute distress. Eyes: Conjunctivae are normal. PERRL. EOMI. Head: Atraumatic. Nose: No congestion/rhinnorhea. Mouth/Throat: Mucous membranes are moist.  Oropharynx non-erythematous. Neck: No stridor. *No cervical spine tenderness to palpation. Hematological/Lymphatic/Immunilogical: No cervical lymphadenopathy. Cardiovascular: Normal rate, regular rhythm. Grossly normal heart sounds.  Good peripheral circulation. Respiratory: Normal respiratory effort.  No retractions. Lungs CTAB. Gastrointestinal: Soft and nontender. No distention. No abdominal bruits. No CVA tenderness. Musculoskeletal: No lower extremity tenderness nor edema.  No joint effusions. Neurologic:  Normal speech and language. No gross focal neurologic  deficits are appreciated. No gait instability. Skin:  Skin is warm, dry and intact. No rash noted.Erythematous macular lesions upper and lower extremities. Lesions also appear on the lateral areas of the anterior posterior trunk. Psychiatric: Mood and affect are normal. Speech and behavior are normal.  ____________________________________________   LABS (all labs ordered are listed, but only abnormal  results are displayed)  Labs Reviewed - No data to display ____________________________________________  EKG   ____________________________________________  RADIOLOGY   ____________________________________________   PROCEDURES  Procedure(s) performed: None  Critical Care performed: No  ____________________________________________   INITIAL IMPRESSION / ASSESSMENT AND PLAN / ED COURSE  Pertinent labs & imaging results that were available during my care of the patient were reviewed by me and considered in my medical decision making (see chart for details).   ____________________________________________   FINAL CLINICAL IMPRESSION(S) / ED DIAGNOSES  Final diagnoses:  Skin macule or macular rash      NEW MEDICATIONS STARTED DURING THIS VISIT:  New Prescriptions   CLOTRIMAZOLE-BETAMETHASONE (LOTRISONE) CREAM    Apply to affected area 2 times daily   CYPROHEPTADINE (PERIACTIN) 4 MG TABLET    Take 1 tablet (4 mg total) by mouth 3 (three) times daily as needed for allergies.     Note:  This document was prepared using Dragon voice recognition software and may include unintentional dictation errors.    Sable Feil, PA-C 04/17/16 [place], PA-C 04/17/16 1231  Earleen Newport, MD 04/17/16 [phone]

## 2016-04-17 NOTE — Discharge Instructions (Signed)
Take medications as directed. Follow-up with dermatology if no improvement in 3-5 days.

## 2016-04-17 NOTE — ED Notes (Signed)
Pt has red, swollen patches on skin at various places on the body, that are itchy.  No swelling of lips, face, tongue.  Pt does state she has decreased appetite and "dry mouth and throat".  Pt had eye surgery on 04/05/16, after which she started taking antibiotic and prednisone.  Pt went off the orignial abx and was started on another abx.  Pt stopped taking the 2nd abx 2 days ago and rash is still present.

## 2016-06-11 ENCOUNTER — Other Ambulatory Visit: Payer: Self-pay | Admitting: Internal Medicine

## 2016-06-11 DIAGNOSIS — Z1231 Encounter for screening mammogram for malignant neoplasm of breast: Secondary | ICD-10-CM

## 2016-07-15 ENCOUNTER — Other Ambulatory Visit: Payer: Self-pay | Admitting: Internal Medicine

## 2016-07-15 ENCOUNTER — Ambulatory Visit
Admission: RE | Admit: 2016-07-15 | Discharge: 2016-07-15 | Disposition: A | Discharge status: Home / Self Care | Payer: Medicare Other | Source: Ambulatory Visit | Attending: Internal Medicine | Admitting: Internal Medicine

## 2016-07-15 DIAGNOSIS — Z1231 Encounter for screening mammogram for malignant neoplasm of breast: Secondary | ICD-10-CM

## 2017-03-10 ENCOUNTER — Inpatient Hospital Stay
Admission: EM | Admit: 2017-03-10 | Discharge: 2017-03-14 | DRG: 482 | Disposition: A | Discharge status: Skilled Nursing Facility | Payer: Medicare Other | Attending: Internal Medicine | Admitting: Internal Medicine

## 2017-03-10 ENCOUNTER — Emergency Department: Payer: Medicare Other

## 2017-03-10 ENCOUNTER — Encounter: Payer: Self-pay | Admitting: Emergency Medicine

## 2017-03-10 ENCOUNTER — Inpatient Hospital Stay: Payer: Medicare Other

## 2017-03-10 DIAGNOSIS — Z801 Family history of malignant neoplasm of trachea, bronchus and lung: Secondary | ICD-10-CM | POA: Diagnosis not present

## 2017-03-10 DIAGNOSIS — G4733 Obstructive sleep apnea (adult) (pediatric): Secondary | ICD-10-CM | POA: Diagnosis present

## 2017-03-10 DIAGNOSIS — Z803 Family history of malignant neoplasm of breast: Secondary | ICD-10-CM | POA: Diagnosis not present

## 2017-03-10 DIAGNOSIS — Y92009 Unspecified place in unspecified non-institutional (private) residence as the place of occurrence of the external cause: Secondary | ICD-10-CM | POA: Diagnosis not present

## 2017-03-10 DIAGNOSIS — Z8249 Family history of ischemic heart disease and other diseases of the circulatory system: Secondary | ICD-10-CM | POA: Diagnosis not present

## 2017-03-10 DIAGNOSIS — Z7951 Long term (current) use of inhaled steroids: Secondary | ICD-10-CM

## 2017-03-10 DIAGNOSIS — M25559 Pain in unspecified hip: Secondary | ICD-10-CM | POA: Diagnosis present

## 2017-03-10 DIAGNOSIS — Z9889 Other specified postprocedural states: Secondary | ICD-10-CM

## 2017-03-10 DIAGNOSIS — Z882 Allergy status to sulfonamides status: Secondary | ICD-10-CM

## 2017-03-10 DIAGNOSIS — I10 Essential (primary) hypertension: Secondary | ICD-10-CM | POA: Diagnosis present

## 2017-03-10 DIAGNOSIS — Z87891 Personal history of nicotine dependence: Secondary | ICD-10-CM | POA: Diagnosis not present

## 2017-03-10 DIAGNOSIS — W19XXXA Unspecified fall, initial encounter: Secondary | ICD-10-CM | POA: Diagnosis present

## 2017-03-10 DIAGNOSIS — E785 Hyperlipidemia, unspecified: Secondary | ICD-10-CM | POA: Diagnosis present

## 2017-03-10 DIAGNOSIS — S72011A Unspecified intracapsular fracture of right femur, initial encounter for closed fracture: Secondary | ICD-10-CM | POA: Diagnosis present

## 2017-03-10 DIAGNOSIS — H409 Unspecified glaucoma: Secondary | ICD-10-CM | POA: Diagnosis present

## 2017-03-10 DIAGNOSIS — Z881 Allergy status to other antibiotic agents status: Secondary | ICD-10-CM

## 2017-03-10 DIAGNOSIS — Z7983 Long term (current) use of bisphosphonates: Secondary | ICD-10-CM

## 2017-03-10 DIAGNOSIS — Y93K1 Activity, walking an animal: Secondary | ICD-10-CM

## 2017-03-10 DIAGNOSIS — Z79899 Other long term (current) drug therapy: Secondary | ICD-10-CM | POA: Diagnosis not present

## 2017-03-10 DIAGNOSIS — Z419 Encounter for procedure for purposes other than remedying health state, unspecified: Secondary | ICD-10-CM

## 2017-03-10 DIAGNOSIS — S72001A Fracture of unspecified part of neck of right femur, initial encounter for closed fracture: Secondary | ICD-10-CM | POA: Diagnosis present

## 2017-03-10 DIAGNOSIS — M810 Age-related osteoporosis without current pathological fracture: Secondary | ICD-10-CM | POA: Diagnosis present

## 2017-03-10 DIAGNOSIS — Z825 Family history of asthma and other chronic lower respiratory diseases: Secondary | ICD-10-CM | POA: Diagnosis not present

## 2017-03-10 DIAGNOSIS — Z791 Long term (current) use of non-steroidal anti-inflammatories (NSAID): Secondary | ICD-10-CM | POA: Diagnosis not present

## 2017-03-10 DIAGNOSIS — Z888 Allergy status to other drugs, medicaments and biological substances status: Secondary | ICD-10-CM | POA: Diagnosis not present

## 2017-03-10 DIAGNOSIS — I252 Old myocardial infarction: Secondary | ICD-10-CM | POA: Diagnosis not present

## 2017-03-10 DIAGNOSIS — Z880 Allergy status to penicillin: Secondary | ICD-10-CM

## 2017-03-10 DIAGNOSIS — Z8781 Personal history of (healed) traumatic fracture: Secondary | ICD-10-CM

## 2017-03-10 DIAGNOSIS — S72009A Fracture of unspecified part of neck of unspecified femur, initial encounter for closed fracture: Secondary | ICD-10-CM

## 2017-03-10 DIAGNOSIS — M80851A Other osteoporosis with current pathological fracture, right femur, initial encounter for fracture: Secondary | ICD-10-CM | POA: Diagnosis present

## 2017-03-10 DIAGNOSIS — K219 Gastro-esophageal reflux disease without esophagitis: Secondary | ICD-10-CM | POA: Diagnosis present

## 2017-03-10 HISTORY — DX: Age-related osteoporosis without current pathological fracture: M81.0

## 2017-03-10 LAB — BASIC METABOLIC PANEL
ANION GAP: 6 (ref 5–15)
BUN: 16 mg/dL (ref 6–20)
CHLORIDE: 104 mmol/L (ref 101–111)
CO2: 29 mmol/L (ref 22–32)
Calcium: 9.8 mg/dL (ref 8.9–10.3)
Creatinine, Ser: 0.83 mg/dL (ref 0.44–1.00)
GFR calc non Af Amer: >60 mL/min (ref >60)
GLUCOSE: 104 mg/dL — AB (ref 65–99)
POTASSIUM: 4.2 mmol/L (ref 3.5–5.1)
Sodium: 139 mmol/L (ref 135–145)

## 2017-03-10 LAB — CBC WITH DIFFERENTIAL/PLATELET
BASOS ABS: 0.1 10*3/uL (ref 0–0.1)
BASOS PCT: 1 %
Eosinophils Absolute: 0.1 10*3/uL (ref 0–0.7)
Eosinophils Relative: 2 %
HEMATOCRIT: 41.8 % (ref 35.0–47.0)
HEMOGLOBIN: 14.2 g/dL (ref 12.0–16.0)
LYMPHS PCT: 24 %
Lymphs Abs: 2.1 10*3/uL (ref 1.0–3.6)
MCH: 29.3 pg (ref 26.0–34.0)
MCHC: 34.1 g/dL (ref 32.0–36.0)
MCV: 86.1 fL (ref 80.0–100.0)
MONO ABS: 0.5 10*3/uL (ref 0.2–0.9)
Monocytes Relative: 5 %
NEUTROS ABS: 5.9 10*3/uL (ref 1.4–6.5)
Neutrophils Relative %: 68 %
Platelets: 215 10*3/uL (ref 150–440)
RBC: 4.85 MIL/uL (ref 3.80–5.20)
RDW: 13.3 % (ref 11.5–14.5)
WBC: 8.7 10*3/uL (ref 3.6–11.0)

## 2017-03-10 LAB — SURGICAL PCR SCREEN
MRSA, PCR: NEGATIVE
Staphylococcus aureus: NEGATIVE

## 2017-03-10 MED ORDER — ONDANSETRON HCL 4 MG/2ML IJ SOLN: 4.0000 mg | Freq: Four times a day (QID) | INTRAMUSCULAR | Status: DC | PRN

## 2017-03-10 MED ORDER — SENNOSIDES-DOCUSATE SODIUM 8.6-50 MG PO TABS
1.0000 | ORAL_TABLET | Freq: Every evening | ORAL | Status: DC | PRN
  Administered 2017-03-13: 1 via ORAL
  Filled 2017-03-10: qty 1

## 2017-03-10 MED ORDER — DORZOLAMIDE HCL-TIMOLOL MAL 2-0.5 % OP SOLN: 1.0000 [drp] | Freq: Two times a day (BID) | OPHTHALMIC | Status: DC

## 2017-03-10 MED ORDER — ONDANSETRON HCL 4 MG PO TABS: 4.0000 mg | ORAL_TABLET | Freq: Four times a day (QID) | ORAL | Status: DC | PRN

## 2017-03-10 MED ORDER — EZETIMIBE 10 MG PO TABS
10.0000 mg | ORAL_TABLET | Freq: Every day | ORAL | Status: DC
  Administered 2017-03-11 – 2017-03-14 (×3): 10 mg via ORAL
  Filled 2017-03-10 (×3): qty 1

## 2017-03-10 MED ORDER — OXYCODONE HCL 5 MG PO TABS: 5.0000 mg | ORAL_TABLET | ORAL | Status: DC | PRN

## 2017-03-10 MED ORDER — ACETAMINOPHEN 325 MG PO TABS: 650.0000 mg | ORAL_TABLET | Freq: Four times a day (QID) | ORAL | Status: DC | PRN

## 2017-03-10 MED ORDER — KETOROLAC TROMETHAMINE 15 MG/ML IJ SOLN
15.0000 mg | Freq: Four times a day (QID) | INTRAMUSCULAR | Status: DC | PRN
  Administered 2017-03-11: 15 mg via INTRAVENOUS
  Filled 2017-03-10 (×2): qty 1

## 2017-03-10 MED ORDER — SODIUM CHLORIDE 0.9 % IV SOLN
INTRAVENOUS | Status: DC
  Administered 2017-03-10 – 2017-03-11 (×2): via INTRAVENOUS

## 2017-03-10 MED ORDER — KETOROLAC TROMETHAMINE 30 MG/ML IJ SOLN
INTRAMUSCULAR | Status: AC
  Administered 2017-03-10: 15 mg
  Filled 2017-03-10: qty 1

## 2017-03-10 MED ORDER — LATANOPROST 0.005 % OP SOLN
1.0000 [drp] | Freq: Every day | OPHTHALMIC | Status: DC
  Administered 2017-03-11 – 2017-03-13 (×3): 1 [drp] via OPHTHALMIC
  Filled 2017-03-10: qty 2.5

## 2017-03-10 MED ORDER — BISACODYL 5 MG PO TBEC
5.0000 mg | DELAYED_RELEASE_TABLET | Freq: Every day | ORAL | Status: DC | PRN
  Administered 2017-03-13: 5 mg via ORAL
  Filled 2017-03-10: qty 1

## 2017-03-10 MED ORDER — DOCUSATE SODIUM 100 MG PO CAPS
100.0000 mg | ORAL_CAPSULE | Freq: Two times a day (BID) | ORAL | Status: DC
  Administered 2017-03-11 – 2017-03-13 (×5): 100 mg via ORAL
  Filled 2017-03-10 (×6): qty 1

## 2017-03-10 MED ORDER — ACETAMINOPHEN 650 MG RE SUPP: 650.0000 mg | Freq: Four times a day (QID) | RECTAL | Status: DC | PRN

## 2017-03-10 MED ORDER — LOSARTAN POTASSIUM 50 MG PO TABS
50.0000 mg | ORAL_TABLET | Freq: Every day | ORAL | Status: DC
  Administered 2017-03-11 – 2017-03-14 (×4): 50 mg via ORAL
  Filled 2017-03-10 (×4): qty 1

## 2017-03-10 MED ORDER — PANTOPRAZOLE SODIUM 40 MG PO TBEC
40.0000 mg | DELAYED_RELEASE_TABLET | Freq: Every day | ORAL | Status: DC
  Administered 2017-03-11 – 2017-03-14 (×4): 40 mg via ORAL
  Filled 2017-03-10 (×4): qty 1

## 2017-03-10 MED ORDER — DORZOLAMIDE HCL-TIMOLOL MAL PF 22.3-6.8 MG/ML OP SOLN
1.0000 [drp] | Freq: Two times a day (BID) | OPHTHALMIC | Status: DC
  Administered 2017-03-11 – 2017-03-14 (×7): 1 [drp] via OPHTHALMIC
  Filled 2017-03-10 (×10): qty 0.1

## 2017-03-10 NOTE — H&P (Signed)
[facility] NAME: Sonya Gaines    MR#:  [id number]  DATE OF BIRTH:  [birth year]  DATE OF ADMISSION:  03/10/2017  PRIMARY CARE PHYSICIAN: Tracie Harrier, MD   REQUESTING/REFERRING PHYSICIAN: Nance Pear, MD  CHIEF COMPLAINT:   Chief Complaint  Patient presents with  . [place] by accident today. HISTORY OF PRESENT ILLNESS:  Sonya Gaines  is a 71 y.o. female with a known history of Hypertension, hyperlipidemia, osteoporosis and OSA. The patient fell by accident at home today. She hit her right hip, cannot move and developed right hip pain. Also hit her right elbow. But she denies any pain in her right elbow. She denies any syncope, loss of consciousness or seizure. X-ray show right hip fracture.  PAST MEDICAL HISTORY:   Past Medical History:  Diagnosis Date  . Glaucoma   . Hyperlipidemia   . Hypertension   . OSA (obstructive sleep apnea)    not on CPAP   . Osteoporosis     PAST SURGICAL HISTORY:   Past Surgical History:  Procedure Laterality Date  . BREAST CYST ASPIRATION Right YRS AGO  . GLAUCOMA SURGERY     x 2   . NASAL SINUS SURGERY     x 3   . VESICOVAGINAL FISTULA CLOSURE W/ TAH      SOCIAL HISTORY:   Social History  Substance Use Topics  . Smoking status: Former Smoker    Packs/day: 1.50    Years: 30.00    Types: Cigarettes    Quit date: 11/19/1991  . Smokeless tobacco: Never Used  . Alcohol use No    FAMILY HISTORY:   Family History  Problem Relation Age of Onset  . Emphysema Father     smoked  . Heart disease Father   . Heart disease Brother   . Breast cancer Maternal Aunt   . Lung cancer Paternal Grandfather     never smoker  . Lung cancer Son     never smoker    DRUG ALLERGIES:   Allergies  Allergen Reactions  . Brimonidine Rash  . Clarithromycin Rash  . Clindamycin/Lincomycin Swelling and Rash  . Levofloxacin Rash  . Penicillins Rash  . Polytrim [Polymyxin  B-Trimethoprim] Rash  . Statins Rash  . Sulfonamide Derivatives Rash  . Tobramycin Rash    REVIEW OF SYSTEMS:   Review of Systems  Constitutional: Negative for chills, fever and malaise/fatigue.  HENT: Negative for congestion and sore throat.   Eyes: Negative for blurred vision and double vision.  Respiratory: Negative for cough, shortness of breath, wheezing and stridor.   Cardiovascular: Negative for chest pain and leg swelling.  Gastrointestinal: Negative for abdominal pain, blood in stool, diarrhea, melena, nausea and vomiting.  Genitourinary: Negative for dysuria, hematuria and urgency.  Musculoskeletal: Positive for joint pain. Negative for back pain.  Skin: Negative for itching and rash.  Neurological: Negative for dizziness, focal weakness, loss of consciousness and weakness.  Psychiatric/Behavioral: Negative for depression. The patient is not nervous/anxious.     MEDICATIONS AT HOME:   Prior to Admission medications   Medication Sig Start Date End Date Taking? Authorizing Provider  alendronate (FOSAMAX) 70 MG tablet Take 70 mg by mouth once a week. Take with a full glass of water on an empty stomach.   Yes Historical Provider, MD  calcium-vitamin D (SM CALCIUM 500/VITAMIN D3) 500-400 MG-UNIT tablet Take 1 tablet by mouth at bedtime.   Yes Historical Provider, MD  dorzolamide-timolol (COSOPT) 22.3-6.8 MG/ML ophthalmic solution Place 1 drop into the right eye 2 (two) times daily.    Yes Historical Provider, MD  esomeprazole (NEXIUM) 40 MG capsule Take 40 mg by mouth daily. 11/05/16  Yes Historical Provider, MD  ezetimibe (ZETIA) 10 MG tablet Take 10 mg by mouth daily.   Yes Historical Provider, MD  latanoprost (XALATAN) 0.005 % ophthalmic solution Place 1 drop into the right eye at bedtime.    Yes Historical Provider, MD  losartan (COZAAR) 50 MG tablet Take 50 mg by mouth daily.   Yes Historical Provider, MD  Multiple Vitamins-Minerals (PRESERVISION AREDS) TABS Take 1 tablet  by mouth 2 (two) times daily.    Yes Historical Provider, MD  Multiple Vitamins-Minerals (WOMENS 50+ MULTI VITAMIN/MIN) TABS Take 1 tablet by mouth daily.   Yes Historical Provider, MD  budesonide-formoterol (SYMBICORT) 80-4.5 MCG/ACT inhaler Take 2 puffs first thing in am and then another 2 puffs about 12 hours later. Patient not taking: Reported on 03/10/2017 01/25/15   Tanda Rockers, MD  clotrimazole-betamethasone (LOTRISONE) cream Apply to affected area 2 times daily Patient not taking: Reported on 03/10/2017 04/17/16   Sable Feil, PA-C  cyproheptadine (PERIACTIN) 4 MG tablet Take 1 tablet (4 mg total) by mouth 3 (three) times daily as needed for allergies. Patient not taking: Reported on 03/10/2017 04/17/16   Sable Feil, PA-C      VITAL SIGNS:  Blood pressure (!) 163/102, pulse 76, temperature 98.2 F (36.8 C), temperature source Oral, resp. rate 15, height 5\' 1"  (1.549 m), weight 122 lb (55.3 kg), SpO2 100 %.  PHYSICAL EXAMINATION:  Physical Exam  GENERAL:  71 y.o.-year-old patient lying in the bed with no acute distress.  EYES: Pupils equal, round, reactive to light and accommodation. No scleral icterus. Extraocular muscles intact.  HEENT: Head atraumatic, normocephalic. Oropharynx and nasopharynx clear.  NECK:  Supple, no jugular venous distention. No thyroid enlargement, no tenderness.  LUNGS: Normal breath sounds bilaterally, no wheezing, rales,rhonchi or crepitation. No use of accessory muscles of respiration.  CARDIOVASCULAR: S1, S2 normal. No murmurs, rubs, or gallops.  ABDOMEN: Soft, nontender, nondistended. Bowel sounds present. No organomegaly or mass.  EXTREMITIES: No pedal edema, cyanosis, or clubbing.  NEUROLOGIC: Cranial nerves II through XII are intact. Muscle strength 5/5 in all extremities. Sensation intact. Gait not checked.  PSYCHIATRIC: The patient is alert and oriented x 3.  SKIN: No obvious rash, lesion, or ulcer.   LABORATORY PANEL:   CBC  Recent  Labs Lab 03/10/17 1949  WBC 8.7  HGB 14.2  HCT 41.8  PLT 215   ------------------------------------------------------------------------------------------------------------------  Chemistries   Recent Labs Lab 03/10/17 1949  NA 139  K 4.2  CL 104  CO2 29  GLUCOSE 104*  BUN 16  CREATININE 0.83  CALCIUM 9.8   ------------------------------------------------------------------------------------------------------------------  Cardiac Enzymes No results for input(s): TROPONINI in the last 168 hours. ------------------------------------------------------------------------------------------------------------------  RADIOLOGY:  Dg Chest Portable 1 View  Result Date: 03/10/2017 CLINICAL DATA:  Preoperative chest radiograph for hip fracture. EXAM: PORTABLE CHEST 1 VIEW COMPARISON:  01/25/2016; 01/27/2009 FINDINGS: Grossly unchanged cardiac silhouette and mediastinal contours. The lungs appear hyperexpanded with flattening of the diaphragms. No focal airspace opacities. No pleural effusion or pneumothorax. No evidence of edema. No definite acute osseus abnormalities. IMPRESSION: Hyperexpanded lungs without superimposed acute cardiopulmonary disease. Electronically Signed   By: Sandi Mariscal M.D.   On: 03/10/2017 20:17   Dg Hip Unilat  With Pelvis 2-3 Views Right  Result Date:  03/10/2017 CLINICAL DATA:  Right hip pain after fall today EXAM: DG HIP (WITH OR WITHOUT PELVIS) 2-3V RIGHT COMPARISON:  None. FINDINGS: Subtle fracture noted on the frog-leg view of the right hip at the junction of the femoral head and neck laterally. No dislocation of either hip. Subtle subcortical sclerosis involving the femoral heads may reflect changes of AVN without flattening. Bony pelvis appears intact. Small ossification noted overlying the right femoral head may represent an intra-articular ossific loose body or potentially overlap from a soft tissue granuloma. There is mild degenerative disc space narrowing at  L4-5 and L5-S1 with associated facet arthropathy. IMPRESSION: Subtle nondisplaced fracture at the junction of the femoral head and neck, best appreciated on the frog-leg view. Subchondral sclerosis of the femoral heads bilaterally may reflect early changes of AVN without flattening of the femoral heads. Electronically Signed   By: Ashley Royalty M.D.   On: 03/10/2017 19:24      IMPRESSION AND PLAN:   Right hip fracture. The patient will be admitted to medical floor. She has lower rate risk for right hip surgery. Pain control, follow-up Dr. Mack Guise for surgery tomorrow. DVT prophylaxis and PT after surgery.  Hypertension. Controlled. Continue losartan. OSA. Stable.  All the records are reviewed and case discussed with ED provider. Management plans discussed with the patient, her daughter and they are in agreement.  CODE STATUS: Full code  TOTAL TIME TAKING CARE OF THIS PATIENT: 48 minutes.    Demetrios Loll M.D on 03/10/2017 at 8:37 PM  Between 7am to 6pm - Pager - [phone]  After 6pm go to [email or web address] - Proofreader  [facility]  Office  [phone]  CC: Primary care physician; Tracie Harrier, MD   Note: This dictation was prepared with Dragon dictation along with smaller phrase technology. Any transcriptional errors that result from this process are unintentional.

## 2017-03-10 NOTE — ED Provider Notes (Signed)
[facility] Emergency Department Provider Note   ____________________________________________   I have reviewed the triage vital signs and the nursing notes.   HISTORY  Chief Complaint Fall   History limited by: Not Limited   HPI Sonya Gaines is a 71 y.o. female who presents to the emergency department today because of right hip pain after a fall. The patient states that she was letting her dog out when the dog sprinted head. He did pull her down a couple of steps and she fell onto her right hip. Also hit her right elbow. Denies any significant pain in her right elbow at this time. No head injury or LOC.    Past Medical History:  Diagnosis Date  . Glaucoma   . Hyperlipidemia   . Hypertension   . OSA (obstructive sleep apnea)    not on CPAP   . Osteoporosis     Patient Active Problem List   Diagnosis Date Noted  . Cough variant asthma 01/25/2015  . GASTROESOPHAGEAL REFLUX DISEASE 04/27/2009  . DYSPNEA 03/20/2009  . HYPERTENSION 01/26/2009  . COUGH, CHRONIC 01/26/2009    Past Surgical History:  Procedure Laterality Date  . BREAST CYST ASPIRATION Right YRS AGO  . GLAUCOMA SURGERY     x 2   . NASAL SINUS SURGERY     x 3   . VESICOVAGINAL FISTULA CLOSURE W/ TAH      Prior to Admission medications   Medication Sig Start Date End Date Taking? Authorizing Provider  alendronate (FOSAMAX) 70 MG tablet Take 70 mg by mouth once a week. Take with a full glass of water on an empty stomach.    Historical Provider, MD  budesonide-formoterol (SYMBICORT) 80-4.5 MCG/ACT inhaler Take 2 puffs first thing in am and then another 2 puffs about 12 hours later. 01/25/15   Tanda Rockers, MD  calcium carbonate (TUMS - DOSED IN MG ELEMENTAL CALCIUM) 500 MG chewable tablet Chew 1 tablet by mouth as needed for indigestion or heartburn.    Historical Provider, MD  clotrimazole-betamethasone (LOTRISONE) cream Apply to affected area 2 times daily 04/17/16   Sable Feil, PA-C  cyproheptadine (PERIACTIN) 4 MG tablet Take 1 tablet (4 mg total) by mouth 3 (three) times daily as needed for allergies. 04/17/16   Sable Feil, PA-C  dorzolamide-timolol (COSOPT) 22.3-6.8 MG/ML ophthalmic solution Place 1 drop into both eyes 2 (two) times daily.    Historical Provider, MD  ezetimibe (ZETIA) 10 MG tablet Take 10 mg by mouth daily.    Historical Provider, MD  latanoprost (XALATAN) 0.005 % ophthalmic solution Place 1 drop into both eyes at bedtime.    Historical Provider, MD  losartan (COZAAR) 50 MG tablet Take 50 mg by mouth daily.    Historical Provider, MD  meloxicam (MOBIC) 7.5 MG tablet Take 7.5 mg by mouth daily.    Historical Provider, MD  Multiple Vitamins-Minerals (PRESERVISION AREDS) TABS Take 1 tablet by mouth daily.    Historical Provider, MD  Multiple Vitamins-Minerals (WOMENS 50+ MULTI VITAMIN/MIN) TABS Take 1 tablet by mouth daily.    Historical Provider, MD  omeprazole (PRILOSEC) 20 MG capsule Take 20 mg by mouth daily.    Historical Provider, MD  tobramycin (TOBREX) 0.3 % ophthalmic ointment 1 application 3 (three) times daily.    Historical Provider, MD    Allergies Brimonidine; Clarithromycin; Clindamycin/lincomycin; Levofloxacin; Penicillins; Polytrim [polymyxin b-trimethoprim]; Statins; Sulfonamide derivatives; and Tobramycin  Family History  Problem Relation Age of Onset  . Emphysema  Father     smoked  . Heart disease Father   . Heart disease Brother   . Breast cancer Maternal Aunt   . Lung cancer Paternal Grandfather     never smoker  . Lung cancer Son     never smoker    Social History Social History  Substance Use Topics  . Smoking status: Former Smoker    Packs/day: 1.50    Years: 30.00    Types: Cigarettes    Quit date: 11/19/1991  . Smokeless tobacco: Never Used  . Alcohol use No    Review of Systems Constitutional: No fever/chills Eyes: No visual changes. ENT: No sore throat. Cardiovascular: Denies chest  pain. Respiratory: Denies shortness of breath. Gastrointestinal: No abdominal pain.  No nausea, no vomiting.  No diarrhea.   Genitourinary: Negative for dysuria. Musculoskeletal: Positive for right hip pain. Skin: Negative for rash. Neurological: Negative for headaches, focal weakness or numbness.  ____________________________________________   PHYSICAL EXAM:  VITAL SIGNS: ED Triage Vitals  Enc Vitals Group     BP 03/10/17 1832 (!) 178/79     Pulse Rate 03/10/17 1832 74     Resp 03/10/17 1832 15     Temp 03/10/17 1832 98.2 F (36.8 C)     Temp Source 03/10/17 1832 Oral     SpO2 03/10/17 1832 100 %     Weight 03/10/17 1835 122 lb (55.3 kg)     Height 03/10/17 1835 5\' 1"  (1.549 m)     Head Circumference --      Peak Flow --      Pain Score 03/10/17 1845 4   Constitutional: Alert and oriented. Well appearing and in no distress. Eyes: Conjunctivae are normal. Normal extraocular movements. ENT   Head: Normocephalic and atraumatic.   Nose: No congestion/rhinnorhea.   Mouth/Throat: Mucous membranes are moist.   Neck: No stridor. Hematological/Lymphatic/Immunilogical: No cervical lymphadenopathy. Cardiovascular: Normal rate, regular rhythm.  No murmurs, rubs, or gallops.  Respiratory: Normal respiratory effort without tachypnea nor retractions. Breath sounds are clear and equal bilaterally. No wheezes/rales/rhonchi. Gastrointestinal: Soft and non tender. No rebound. No guarding.  Genitourinary: Deferred Musculoskeletal: Normal range of motion in all extremities. No lower extremity edema. Neurologic:  Normal speech and language. No gross focal neurologic deficits are appreciated.  Skin:  Skin is warm, dry and intact. No rash noted. Psychiatric: Mood and affect are normal. Speech and behavior are normal. Patient exhibits appropriate insight and judgment.  ____________________________________________    LABS (pertinent  positives/negatives)  Pending  ____________________________________________   EKG  I, Nance Pear, attending physician, personally viewed and interpreted this EKG  EKG Time: 1952 Rate: 72 Rhythm: normal sinus rhythm Axis: normal Intervals: qtc 413 QRS: narrow ST changes: no st elevation Impression: normal ekg    ____________________________________________    RADIOLOGY  Rigth hip IMPRESSION:  Subtle nondisplaced fracture at the junction of the femoral head and  neck, best appreciated on the frog-leg view. Subchondral sclerosis  of the femoral heads bilaterally may reflect early changes of AVN  without flattening of the femoral heads.     ____________________________________________   PROCEDURES  Procedures  ____________________________________________   INITIAL IMPRESSION / ASSESSMENT AND PLAN / ED COURSE  Pertinent labs & imaging results that were available during my care of the patient were reviewed by me and considered in my medical decision making (see chart for details).   Patient presented to the emergency department today because of concerns for right hip pain after fall. X-ray does show  a fracture. Discussed with orthopedics. Will plan on admission to the hospital service.  ____________________________________________   FINAL CLINICAL IMPRESSION(S) / ED DIAGNOSES  Final diagnoses:  Closed fracture of right hip, initial encounter [facility])     Note: This dictation was prepared with Dragon dictation. Any transcriptional errors that result from this process are unintentional     Nance Pear, MD 03/10/17 2010

## 2017-03-10 NOTE — ED Notes (Signed)
Pt taken to CT via stretcher CT will call when done for transport to floor

## 2017-03-10 NOTE — ED Notes (Signed)
Pt stated she wants to try bedpan to urinate instead of foley cath at this time.

## 2017-03-10 NOTE — ED Triage Notes (Signed)
Pt in via POV; pt reports fall today at home, states her dog drug her down 2 steps and across cement sidewalk, landing on right side.  Pt denies hitting head, denies LOC, denies blood thinners.  Pt with complaints of right hip pain with difficulty ambulating.  Pt to triage in wheelchair.  NAD noted at this time.

## 2017-03-10 NOTE — ED Notes (Signed)
Admitting MD at bedside.

## 2017-03-10 NOTE — ED Notes (Signed)
Pt states was dragged down steps and across cement earlier today, doesn't think she hit her head. Denies blood thinner use. Alert, oriented. Stood up from wheelchair and pivoted to bed. Likes knees elevated.

## 2017-03-11 ENCOUNTER — Inpatient Hospital Stay: Payer: Medicare Other

## 2017-03-11 LAB — BASIC METABOLIC PANEL
Anion gap: 6 (ref 5–15)
BUN: 18 mg/dL (ref 6–20)
CALCIUM: 8.9 mg/dL (ref 8.9–10.3)
CO2: 28 mmol/L (ref 22–32)
CREATININE: 0.7 mg/dL (ref 0.44–1.00)
Chloride: 110 mmol/L (ref 101–111)
GFR calc Af Amer: >60 mL/min (ref >60)
GFR calc non Af Amer: >60 mL/min (ref >60)
GLUCOSE: 104 mg/dL — AB (ref 65–99)
Potassium: 3.7 mmol/L (ref 3.5–5.1)
Sodium: 144 mmol/L (ref 135–145)

## 2017-03-11 LAB — CBC
HCT: 37 % (ref 35.0–47.0)
HEMOGLOBIN: 12.8 g/dL (ref 12.0–16.0)
MCH: 29.8 pg (ref 26.0–34.0)
MCHC: 34.5 g/dL (ref 32.0–36.0)
MCV: 86.2 fL (ref 80.0–100.0)
PLATELETS: 174 10*3/uL (ref 150–440)
RBC: 4.29 MIL/uL (ref 3.80–5.20)
RDW: 13.2 % (ref 11.5–14.5)
WBC: 6.6 10*3/uL (ref 3.6–11.0)

## 2017-03-11 MED ORDER — OCUVITE-LUTEIN PO TABS
1.0000 | ORAL_TABLET | Freq: Two times a day (BID) | ORAL | Status: DC
  Administered 2017-03-11 (×2): 1 via ORAL
  Filled 2017-03-11 (×3): qty 1

## 2017-03-11 NOTE — Progress Notes (Signed)
Sonya Gaines at [place] NAME: Sonya Gaines    MR#:  [id number]  DATE OF BIRTH:  July 23, 1946  SUBJECTIVE:   Patient is here with fall and noted to have a right hip fracture. Underwent MRI of the right hip showing a subcapital right hip fracture. Await further orthopedic input regarding intervention.  REVIEW OF SYSTEMS:    Review of Systems  Constitutional: Negative for chills and fever.  HENT: Negative for congestion and tinnitus.   Eyes: Negative for blurred vision and double vision.  Respiratory: Negative for cough, shortness of breath and wheezing.   Cardiovascular: Negative for chest pain, orthopnea and PND.  Gastrointestinal: Negative for abdominal pain, diarrhea, nausea and vomiting.  Genitourinary: Negative for dysuria and hematuria.  Musculoskeletal: Positive for falls and joint pain (Right Hip Pain).  Neurological: Negative for dizziness, sensory change and focal weakness.  All other systems reviewed and are negative.   Nutrition: Regular Tolerating Diet: Yes Tolerating PT: AWait Eval.    DRUG ALLERGIES:   Allergies  Allergen Reactions  . Brimonidine Rash  . Clarithromycin Rash  . Clindamycin/Lincomycin Swelling and Rash  . Levofloxacin Rash  . Penicillins Rash  . Polytrim [Polymyxin B-Trimethoprim] Rash  . Statins Rash  . Sulfonamide Derivatives Rash  . Tobramycin Rash    VITALS:  Blood pressure (!) 156/72, pulse 74, temperature 97.5 F (36.4 C), temperature source Oral, resp. rate 18, height 5\' 1"  (1.549 m), weight 57.3 kg (126 lb 4.8 oz), SpO2 98 %.  PHYSICAL EXAMINATION:   Physical Exam  GENERAL:  71 y.o.-year-old patient lying in bed in no acute distress.  EYES: Pupils equal, round, reactive to light and accommodation. No scleral icterus. Extraocular muscles intact.  HEENT: Head atraumatic, normocephalic. Oropharynx and nasopharynx clear.  NECK:  Supple, no jugular venous distention. No thyroid enlargement, no  tenderness.  LUNGS: Normal breath sounds bilaterally, no wheezing, rales, rhonchi. No use of accessory muscles of respiration.  CARDIOVASCULAR: S1, S2 normal. No murmurs, rubs, or gallops.  ABDOMEN: Soft, nontender, nondistended. Bowel sounds present. No organomegaly or mass.  EXTREMITIES: No cyanosis, clubbing or edema b/l.    NEUROLOGIC: Cranial nerves II through XII are intact. No focal Motor or sensory deficits b/l.   PSYCHIATRIC: The patient is alert and oriented x 3.  SKIN: No obvious rash, lesion, or ulcer.    LABORATORY PANEL:   CBC  Recent Labs Lab 03/11/17 0354  WBC 6.6  HGB 12.8  HCT 37.0  PLT 174   ------------------------------------------------------------------------------------------------------------------  Chemistries   Recent Labs Lab 03/11/17 0354  NA 144  K 3.7  CL 110  CO2 28  GLUCOSE 104*  BUN 18  CREATININE 0.70  CALCIUM 8.9   ------------------------------------------------------------------------------------------------------------------  Cardiac Enzymes No results for input(s): TROPONINI in the last 168 hours. ------------------------------------------------------------------------------------------------------------------  RADIOLOGY:  Ct Pelvis Wo Contrast  Result Date: 03/10/2017 CLINICAL DATA:  Right hip pain after being pulled down stairs by dog. EXAM: CT PELVIS WITHOUT CONTRAST TECHNIQUE: Multidetector CT imaging of the pelvis was performed following the standard protocol without intravenous contrast. COMPARISON:  None. FINDINGS: Urinary Tract:  No abnormality visualized. Bowel:  Unremarkable visualized pelvic bowel loops. Vascular/Lymphatic: Aortoiliac and branch vessel atherosclerosis. No aneurysm. No lymphadenopathy. Reproductive:  Hysterectomy.  No adnexal mass. Other:  None. Musculoskeletal: Tiny cortical fracture involving the superolateral aspect of the right femoral head- neck junction, coronal series 4, image 28 can't axial  series 2, image 78. No significant joint effusion. Subchondral degenerate cystic change  of the acetabular roofs bilaterally. Pubic rami, sacroiliac joints and sacral ala are intact. IMPRESSION: Tiny cortical fracture at the right femoral head- neck junction superolaterally. No significant displacement or significant impaction. Electronically Signed   By: Ashley Royalty M.D.   On: 03/10/2017 22:20   Mr Hip Right Wo Contrast  Result Date: 03/11/2017 CLINICAL DATA:  The patient was pull downstairs by a dog 03/10/2017. Possible right hip fracture. EXAM: MR OF THE RIGHT HIP WITHOUT CONTRAST TECHNIQUE: Multiplanar, multisequence MR imaging was performed. No intravenous contrast was administered. COMPARISON:  CT abdomen and pelvis 03/10/2017. FINDINGS: Bones: The patient has an acute subcapital fracture of the right hip. The inferior cortex of the hip may be spared. Marrow edema is seen the anterior wall of the right acetabulum with an appearance most consistent with contusion. Small subchondral cysts in the right acetabulum are also noted. Bone marrow signal is otherwise normal. No avascular necrosis of the femoral heads. Sacroiliac joints and symphysis pubis appear normal. Articular cartilage and labrum Articular cartilage:  Mildly degenerated. Labrum: The anterior superior labrum is degenerated without focal tear. Joint or bursal effusion Joint effusion:  Small right hip joint effusion. Bursae:  Negative. Muscles and tendons Muscles and tendons:  Intact. Other findings Miscellaneous: Soft tissue contusion contusion in the fatty tissues over the right hip is noted. IMPRESSION: Acute subcapital fracture right hip appears nondisplaced and may be incomplete with sparing of the inferior cortex of the femoral neck. Contusion in the fatty soft tissues about the right hip is noted. Marrow edema in the anterior wall of the right acetabulum most consistent with bone contusion. 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  Electronically Signed   By: Inge Rise M.D.   On: 03/11/2017 14:06   Dg Chest Portable 1 View  Result Date: 03/10/2017 CLINICAL DATA:  Preoperative chest radiograph for hip fracture. EXAM: PORTABLE CHEST 1 VIEW COMPARISON:  01/25/2016; 01/27/2009 FINDINGS: Grossly unchanged cardiac silhouette and mediastinal contours. The lungs appear hyperexpanded with flattening of the diaphragms. No focal airspace opacities. No pleural effusion or pneumothorax. No evidence of edema. No definite acute osseus abnormalities. IMPRESSION: Hyperexpanded lungs without superimposed acute cardiopulmonary disease. Electronically Signed   By: Sandi Mariscal M.D.   On: 03/10/2017 20:17   Dg Hip Unilat  With Pelvis 2-3 Views Right  Result Date: 03/10/2017 CLINICAL DATA:  Right hip pain after fall today EXAM: DG HIP (WITH OR WITHOUT PELVIS) 2-3V RIGHT COMPARISON:  None. FINDINGS: Subtle fracture noted on the frog-leg view of the right hip at the junction of the femoral head and neck laterally. No dislocation of either hip. Subtle subcortical sclerosis involving the femoral heads may reflect changes of AVN without flattening. Bony pelvis appears intact. Small ossification noted overlying the right femoral head may represent an intra-articular ossific loose body or potentially overlap from a soft tissue granuloma. There is mild degenerative disc space narrowing at L4-5 and L5-S1 with associated facet arthropathy. IMPRESSION: Subtle nondisplaced fracture at the junction of the femoral head and neck, best appreciated on the frog-leg view. Subchondral sclerosis of the femoral heads bilaterally may reflect early changes of AVN without flattening of the femoral heads. Electronically Signed   By: Ashley Royalty M.D.   On: 03/10/2017 19:24     ASSESSMENT AND PLAN:   71 year old female with past medical history of glaucoma, hyperlipidemia, hypertension, obstructive sleep apnea, osteoporosis who presented to the hospital after a fall and  noted to have a right hip fracture.  1. Status post  fall and right hip fracture-patient underwent an MRI of the hip today which confirmed a subcapital right hip fracture. -Await further orthopedic input to see if patient requires surgical intervention. -Continue supportive care and pain control for now.  2. Essential hypertension-continue losartan.  3. Glaucoma-continue latanoprost, dorzolamide eyedrops.  4. Hyperlipidemia-continue Zetia  5. GERD - cont. Protonix.    All the records are reviewed and case discussed with Care Management/Social Worker. Management plans discussed with the patient, family and they are in agreement.  CODE STATUS: Full code  DVT Prophylaxis: Ted's & SCD's.   TOTAL TIME TAKING CARE OF THIS PATIENT: 30 minutes.   POSSIBLE D/C IN 2-3 DAYS, DEPENDING ON CLINICAL CONDITION.   Henreitta Leber M.D on 03/11/2017 at 3:08 PM  Between 7am to 6pm - Pager - [phone]  After 6pm go to [email or web address] - [employer] [facility]  Office  [phone]  CC: Primary care physician; Tracie Harrier, MD

## 2017-03-11 NOTE — Consult Note (Signed)
ORTHOPAEDIC CONSULTATION  REQUESTING PHYSICIAN: Henreitta Leber, MD  Chief Complaint: Right hip pain status post fall  HPI: Sonya Gaines is a 71 y.o. female who complains of  right hip pain after fall at home today. Patient was taking her dog out for a walk. The dog pulled her, causing her to lose her balance. She fell onto her right side. Patient was brought to the [facility] emergency Department where x-rays were taken. Patient was noted to have cortical irregularity of the right femoral head. A follow-up CT scan has confirmed a fracture at the lateral junction of the head and neck of the right hip.  There is no apparent collapse or displacement of this fracture.  Past Medical History:  Diagnosis Date  . Glaucoma   . Hyperlipidemia   . Hypertension   . OSA (obstructive sleep apnea)    not on CPAP   . Osteoporosis    Past Surgical History:  Procedure Laterality Date  . BREAST CYST ASPIRATION Right YRS AGO  . GLAUCOMA SURGERY     x 2   . NASAL SINUS SURGERY     x 3   . VESICOVAGINAL FISTULA CLOSURE W/ TAH     Social History   Social History  . Marital status: Married    Spouse name: N/A  . Number of children: N/A  . Years of education: N/A   Occupational History  . part time scanning medical records  [employer]   Social History Main Topics  . Smoking status: Former Smoker    Packs/day: 1.50    Years: 30.00    Types: Cigarettes    Quit date: 11/19/1991  . Smokeless tobacco: Never Used  . Alcohol use No  . Drug use: No  . Sexual activity: Not Asked   Other Topics Concern  . None   Social History Narrative  . None   Family History  Problem Relation Age of Onset  . Emphysema Father     smoked  . Heart disease Father   . Heart disease Brother   . Breast cancer Maternal Aunt   . Lung cancer Paternal Grandfather     never smoker  . Lung cancer Son     never smoker   Allergies  Allergen Reactions  . Brimonidine Rash  .  Clarithromycin Rash  . Clindamycin/Lincomycin Swelling and Rash  . Levofloxacin Rash  . Penicillins Rash  . Polytrim [Polymyxin B-Trimethoprim] Rash  . Statins Rash  . Sulfonamide Derivatives Rash  . Tobramycin Rash   Prior to Admission medications   Medication Sig Start Date End Date Taking? Authorizing Provider  alendronate (FOSAMAX) 70 MG tablet Take 70 mg by mouth once a week. Take with a full glass of water on an empty stomach.   Yes Historical Provider, MD  calcium-vitamin D (SM CALCIUM 500/VITAMIN D3) 500-400 MG-UNIT tablet Take 1 tablet by mouth at bedtime.   Yes Historical Provider, MD  dorzolamide-timolol (COSOPT) 22.3-6.8 MG/ML ophthalmic solution Place 1 drop into the right eye 2 (two) times daily.    Yes Historical Provider, MD  esomeprazole (NEXIUM) 40 MG capsule Take 40 mg by mouth daily. 11/05/16  Yes Historical Provider, MD  ezetimibe (ZETIA) 10 MG tablet Take 10 mg by mouth daily.   Yes Historical Provider, MD  latanoprost (XALATAN) 0.005 % ophthalmic solution Place 1 drop into the right eye at bedtime.    Yes Historical Provider, MD  losartan (COZAAR) 50 MG tablet Take 50 mg by mouth daily.  Yes Historical Provider, MD  Multiple Vitamins-Minerals (PRESERVISION AREDS) TABS Take 1 tablet by mouth 2 (two) times daily.    Yes Historical Provider, MD  Multiple Vitamins-Minerals (WOMENS 50+ MULTI VITAMIN/MIN) TABS Take 1 tablet by mouth daily.   Yes Historical Provider, MD  budesonide-formoterol (SYMBICORT) 80-4.5 MCG/ACT inhaler Take 2 puffs first thing in am and then another 2 puffs about 12 hours later. Patient not taking: Reported on 03/10/2017 01/25/15   Tanda Rockers, MD  clotrimazole-betamethasone (LOTRISONE) cream Apply to affected area 2 times daily Patient not taking: Reported on 03/10/2017 04/17/16   Sable Feil, PA-C  cyproheptadine (PERIACTIN) 4 MG tablet Take 1 tablet (4 mg total) by mouth 3 (three) times daily as needed for allergies. Patient not taking: Reported  on 03/10/2017 04/17/16   Sable Feil, PA-C   Ct Pelvis Wo Contrast  Result Date: 03/10/2017 CLINICAL DATA:  Right hip pain after being pulled down stairs by dog. EXAM: CT PELVIS WITHOUT CONTRAST TECHNIQUE: Multidetector CT imaging of the pelvis was performed following the standard protocol without intravenous contrast. COMPARISON:  None. FINDINGS: Urinary Tract:  No abnormality visualized. Bowel:  Unremarkable visualized pelvic bowel loops. Vascular/Lymphatic: Aortoiliac and branch vessel atherosclerosis. No aneurysm. No lymphadenopathy. Reproductive:  Hysterectomy.  No adnexal mass. Other:  None. Musculoskeletal: Tiny cortical fracture involving the superolateral aspect of the right femoral head- neck junction, coronal series 4, image 28 can't axial series 2, image 78. No significant joint effusion. Subchondral degenerate cystic change of the acetabular roofs bilaterally. Pubic rami, sacroiliac joints and sacral ala are intact. IMPRESSION: Tiny cortical fracture at the right femoral head- neck junction superolaterally. No significant displacement or significant impaction. Electronically Signed   By: Ashley Royalty M.D.   On: 03/10/2017 22:20   Dg Chest Portable 1 View  Result Date: 03/10/2017 CLINICAL DATA:  Preoperative chest radiograph for hip fracture. EXAM: PORTABLE CHEST 1 VIEW COMPARISON:  01/25/2016; 01/27/2009 FINDINGS: Grossly unchanged cardiac silhouette and mediastinal contours. The lungs appear hyperexpanded with flattening of the diaphragms. No focal airspace opacities. No pleural effusion or pneumothorax. No evidence of edema. No definite acute osseus abnormalities. IMPRESSION: Hyperexpanded lungs without superimposed acute cardiopulmonary disease. Electronically Signed   By: Sandi Mariscal M.D.   On: 03/10/2017 20:17   Dg Hip Unilat  With Pelvis 2-3 Views Right  Result Date: 03/10/2017 CLINICAL DATA:  Right hip pain after fall today EXAM: DG HIP (WITH OR WITHOUT PELVIS) 2-3V RIGHT COMPARISON:   None. FINDINGS: Subtle fracture noted on the frog-leg view of the right hip at the junction of the femoral head and neck laterally. No dislocation of either hip. Subtle subcortical sclerosis involving the femoral heads may reflect changes of AVN without flattening. Bony pelvis appears intact. Small ossification noted overlying the right femoral head may represent an intra-articular ossific loose body or potentially overlap from a soft tissue granuloma. There is mild degenerative disc space narrowing at L4-5 and L5-S1 with associated facet arthropathy. IMPRESSION: Subtle nondisplaced fracture at the junction of the femoral head and neck, best appreciated on the frog-leg view. Subchondral sclerosis of the femoral heads bilaterally may reflect early changes of AVN without flattening of the femoral heads. Electronically Signed   By: Ashley Royalty M.D.   On: 03/10/2017 19:24    Positive ROS: All other systems have been reviewed and were otherwise negative with the exception of those mentioned in the HPI and as above.  Physical Exam: General: Alert, no acute distress  MUSCULOSKELETAL: Right extremity: Patient  does not have shortening or external rotation of the right lower extremity. Her skin is intact. She has intact sensation light touch throughout the right lower extremity. She demonstrates no weakness to dorsi flexion and plantar flexion of her right ankle. She has palpable pedal pulses.  She had anterior right hip pain with active knee and hip flexion of 45.  She had no significant pain with logrolling.  Assessment: Right femoral head versus femoral neck fracture  Plan: I reviewed the patient's plain films and CT scan. The patient has a fracture exiting the lateral femoral head near the head neck junction. The full extent of this fracture cannot be adequately assessed on CT scan. I'm going to order an MRI to further evaluate this fracture. This will help determine whether the patient require surgery or  not. If the patient is deemed not to require surgery she will the toe-touch weightbearing only on the right side for at least 6 weeks. If the fracture is more extensive she may require ORIF versus hemiarthroplasty. I will follow up with the MRI results when available. Patient should not bear weight on the right lower extremity until the results of MRI are available. Patient will be allowed to eat today. Any surgery that may be required will be planned for tomorrow.  If surgery is not required, physical therapy will be consulted for safety evaluation and to teach the patient toe-touch weightbearing with a walker.    Thornton Park, MD    03/11/2017 9:03 AM

## 2017-03-11 NOTE — Clinical Social Work Note (Signed)
Clinical Social Work Assessment  Patient Details  Name: Sonya Gaines MRN: [id number] Date of Birth: 11/30/1945  Date of referral:  03/11/17               Reason for consult:  Facility Placement, Discharge Planning                Permission sought to share information with:  Facility Contact Representative Permission granted to share information::  Yes, Verbal Permission Granted  Name::      Skilled Nursing Facility   Agency::   [place]   Relationship::     Contact Information:     Housing/Transportation Living arrangements for the past 2 months:  Single Family Home Source of Information:  Patient Patient Interpreter Needed:  None Criminal Activity/Legal Involvement Pertinent to Current Situation/Hospitalization:  No - Comment as needed Significant Relationships:  Adult Children, Other Family Members, Siblings, Spouse Lives with:  Spouse Do you feel safe going back to the place where you live?  Yes Need for family participation in patient care:  Yes (Comment)  Care giving concerns:  Patient lives at home in [place] with her husband Sonya Gaines.    Social Worker assessment / plan:  Social work intern noted that patient may need surgery. Plans are for patient to have surgery tomorrow. Social work intern met with patient, patient's husband, sister, and daughter at bedside. Patient was laying in bed and was alert and oriented x4. Social work intern introduced self and explained role of social work department. Per patient, she lives at home with her husband Sonya Gaines in [place]. Patient has one daughter, Sonya Gaines that lives in the area as well as several other family members. Patient does not have HPOA at this time. Social work intern explained the process after surgery and discussed discharge plans. Social work intern stated that PT will work with patient after surgery and determine if patient needs SNF placement or can go home and receive home health. Patient is preferring to go to rehab due to  having three large dogs at home. Patient did not have a preference of a SNF at this time. Social work intern will continue to assist and follow as needed.   FL2 completed and faxed out.   Employment status:  Unemployed Insurance information:  Managed Medicare PT Recommendations:  Not assessed at this time Information / Referral to community resources:  Skilled Nursing Facility  Patient/Family's Response to care:  Patient is preferring SNF.   Patient/Family's Understanding of and Emotional Response to Diagnosis, Current Treatment, and Prognosis:  Patient and patient's family was pleasant and thanked social work intern for visiting.   Emotional Assessment Appearance:  Appears stated age Attitude/Demeanor/Rapport:    Affect (typically observed):  Accepting, Adaptable, Appropriate Orientation:  Oriented to Self, Oriented to Place, Oriented to  Time, Oriented to Situation Alcohol / Substance use:  Not Applicable Psych involvement (Current and /or in the community):  No (Comment)  Discharge Needs  Concerns to be addressed:  Basic Needs Readmission within the last 30 days:    Current discharge risk:  Dependent with Mobility Barriers to Discharge:  Continued Medical Work up   Sonya Gaines, Student-Social Work 03/11/2017, 10:08 AM  

## 2017-03-11 NOTE — Progress Notes (Signed)
Subjective:  Patient is lying in her hospital bed. Her sister is at the bedside. Patient reports pain as mild at rest.  Patient has had her MRI of the right hip.  Objective:   VITALS:   Vitals:   03/10/17 2201 03/11/17 0730 03/11/17 0940 03/11/17 1424  BP:  (!) 141/67 (!) 143/67 (!) 156/72  Pulse:  73 84 74  Resp:  18  18  Temp:  97.5 F (36.4 C)    TempSrc:  Oral    SpO2:  98%  98%  Weight: 57.3 kg (126 lb 4.8 oz)     Height:        PHYSICAL EXAM:  Right lower extremity: Neurovascular intact Sensation intact distally Intact pulses distally Dorsiflexion/Plantar flexion intact No cellulitis present Compartment soft  LABS  Results for orders placed or performed during the hospital encounter of 03/10/17 (from the past 24 hour(s))  CBC with Differential     Status: None   Collection Time: 03/10/17  7:49 PM  Result Value Ref Range   WBC 8.7 3.6 - 11.0 K/uL   RBC 4.85 3.80 - 5.20 MIL/uL   Hemoglobin 14.2 12.0 - 16.0 g/dL   HCT 41.8 35.0 - 47.0 %   MCV 86.1 80.0 - 100.0 fL   MCH 29.3 26.0 - 34.0 pg   MCHC 34.1 32.0 - 36.0 g/dL   RDW 13.3 11.5 - 14.5 %   Platelets 215 150 - 440 K/uL   Neutrophils Relative % 68 %   Neutro Abs 5.9 1.4 - 6.5 K/uL   Lymphocytes Relative 24 %   Lymphs Abs 2.1 1.0 - 3.6 K/uL   Monocytes Relative 5 %   Monocytes Absolute 0.5 0.2 - 0.9 K/uL   Eosinophils Relative 2 %   Eosinophils Absolute 0.1 0 - 0.7 K/uL   Basophils Relative 1 %   Basophils Absolute 0.1 0 - 0.1 K/uL  Basic metabolic panel     Status: Abnormal   Collection Time: 03/10/17  7:49 PM  Result Value Ref Range   Sodium 139 135 - 145 mmol/L   Potassium 4.2 3.5 - 5.1 mmol/L   Chloride 104 101 - 111 mmol/L   CO2 29 22 - 32 mmol/L   Glucose, Bld 104 (H) 65 - 99 mg/dL   BUN 16 6 - 20 mg/dL   Creatinine, Ser 0.83 0.44 - 1.00 mg/dL   Calcium 9.8 8.9 - 10.3 mg/dL   GFR calc non Af Amer >60 >60 mL/min   GFR calc Af Amer >60 >60 mL/min   Anion gap 6 5 - 15  Surgical PCR screen      Status: None   Collection Time: 03/10/17  9:52 PM  Result Value Ref Range   MRSA, PCR NEGATIVE NEGATIVE   Staphylococcus aureus NEGATIVE NEGATIVE  Basic metabolic panel     Status: Abnormal   Collection Time: 03/11/17  3:54 AM  Result Value Ref Range   Sodium 144 135 - 145 mmol/L   Potassium 3.7 3.5 - 5.1 mmol/L   Chloride 110 101 - 111 mmol/L   CO2 28 22 - 32 mmol/L   Glucose, Bld 104 (H) 65 - 99 mg/dL   BUN 18 6 - 20 mg/dL   Creatinine, Ser 0.70 0.44 - 1.00 mg/dL   Calcium 8.9 8.9 - 10.3 mg/dL   GFR calc non Af Amer >60 >60 mL/min   GFR calc Af Amer >60 >60 mL/min   Anion gap 6 5 - 15  CBC  Status: None   Collection Time: 03/11/17  3:54 AM  Result Value Ref Range   WBC 6.6 3.6 - 11.0 K/uL   RBC 4.29 3.80 - 5.20 MIL/uL   Hemoglobin 12.8 12.0 - 16.0 g/dL   HCT 37.0 35.0 - 47.0 %   MCV 86.2 80.0 - 100.0 fL   MCH 29.8 26.0 - 34.0 pg   MCHC 34.5 32.0 - 36.0 g/dL   RDW 13.2 11.5 - 14.5 %   Platelets 174 150 - 440 K/uL    Ct Pelvis Wo Contrast  Result Date: 03/10/2017 CLINICAL DATA:  Right hip pain after being pulled down stairs by dog. EXAM: CT PELVIS WITHOUT CONTRAST TECHNIQUE: Multidetector CT imaging of the pelvis was performed following the standard protocol without intravenous contrast. COMPARISON:  None. FINDINGS: Urinary Tract:  No abnormality visualized. Bowel:  Unremarkable visualized pelvic bowel loops. Vascular/Lymphatic: Aortoiliac and branch vessel atherosclerosis. No aneurysm. No lymphadenopathy. Reproductive:  Hysterectomy.  No adnexal mass. Other:  None. Musculoskeletal: Tiny cortical fracture involving the superolateral aspect of the right femoral head- neck junction, coronal series 4, image 28 can't axial series 2, image 78. No significant joint effusion. Subchondral degenerate cystic change of the acetabular roofs bilaterally. Pubic rami, sacroiliac joints and sacral ala are intact. IMPRESSION: Tiny cortical fracture at the right femoral head- neck junction  superolaterally. No significant displacement or significant impaction. Electronically Signed   By: Ashley Royalty M.D.   On: 03/10/2017 22:20   Mr Hip Right Wo Contrast  Result Date: 03/11/2017 CLINICAL DATA:  The patient was pull downstairs by a dog 03/10/2017. Possible right hip fracture. EXAM: MR OF THE RIGHT HIP WITHOUT CONTRAST TECHNIQUE: Multiplanar, multisequence MR imaging was performed. No intravenous contrast was administered. COMPARISON:  CT abdomen and pelvis 03/10/2017. FINDINGS: Bones: The patient has an acute subcapital fracture of the right hip. The inferior cortex of the hip may be spared. Marrow edema is seen the anterior wall of the right acetabulum with an appearance most consistent with contusion. Small subchondral cysts in the right acetabulum are also noted. Bone marrow signal is otherwise normal. No avascular necrosis of the femoral heads. Sacroiliac joints and symphysis pubis appear normal. Articular cartilage and labrum Articular cartilage:  Mildly degenerated. Labrum: The anterior superior labrum is degenerated without focal tear. Joint or bursal effusion Joint effusion:  Small right hip joint effusion. Bursae:  Negative. Muscles and tendons Muscles and tendons:  Intact. Other findings Miscellaneous: Soft tissue contusion contusion in the fatty tissues over the right hip is noted. IMPRESSION: Acute subcapital fracture right hip appears nondisplaced and may be incomplete with sparing of the inferior cortex of the femoral neck. Contusion in the fatty soft tissues about the right hip is noted. Marrow edema in the anterior wall of the right acetabulum most consistent with bone contusion. 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 Electronically Signed   By: Inge Rise M.D.   On: 03/11/2017 14:06   Dg Chest Portable 1 View  Result Date: 03/10/2017 CLINICAL DATA:  Preoperative chest radiograph for hip fracture. EXAM: PORTABLE CHEST 1 VIEW COMPARISON:  01/25/2016; 01/27/2009 FINDINGS:  Grossly unchanged cardiac silhouette and mediastinal contours. The lungs appear hyperexpanded with flattening of the diaphragms. No focal airspace opacities. No pleural effusion or pneumothorax. No evidence of edema. No definite acute osseus abnormalities. IMPRESSION: Hyperexpanded lungs without superimposed acute cardiopulmonary disease. Electronically Signed   By: Sandi Mariscal M.D.   On: 03/10/2017 20:17   Dg Hip Unilat  With Pelvis 2-3 Views Right  Result Date: 03/10/2017 CLINICAL DATA:  Right hip pain after fall today EXAM: DG HIP (WITH OR WITHOUT PELVIS) 2-3V RIGHT COMPARISON:  None. FINDINGS: Subtle fracture noted on the frog-leg view of the right hip at the junction of the femoral head and neck laterally. No dislocation of either hip. Subtle subcortical sclerosis involving the femoral heads may reflect changes of AVN without flattening. Bony pelvis appears intact. Small ossification noted overlying the right femoral head may represent an intra-articular ossific loose body or potentially overlap from a soft tissue granuloma. There is mild degenerative disc space narrowing at L4-5 and L5-S1 with associated facet arthropathy. IMPRESSION: Subtle nondisplaced fracture at the junction of the femoral head and neck, best appreciated on the frog-leg view. Subchondral sclerosis of the femoral heads bilaterally may reflect early changes of AVN without flattening of the femoral heads. Electronically Signed   By: Ashley Royalty M.D.   On: 03/10/2017 19:24    Assessment/Plan:     Active Problems:   Closed right hip fracture ([place])  I reviewed with the patient has afternoon findings of her MRI. She understands that she has an incomplete fracture involving the right femoral neck.  It is a subcapital location.  We discussed treatment options including nonoperative versus operative treatment. I have recommended percutaneous fixation for her fracture. Patient was in agreement with the plan for surgery. We discussed  surgery in detail as well as the postoperative course. I've also explained to her the risks of surgery which include infection, bleeding, nerve or blood vessel injury, malunion, nonunion, avascular necrosis, osteoarthritis, hardware failure and the need for further surgery including conversion to a hemiarthroplasty versus total hip arthroplasty. Medical risks include but are not limited to DVT and pulmonary embolism, myocardial infarction, stroke, pneumonia, respiratory failure and death. Patient understood these risks and wished to proceed. Her surgery is scheduled for 11 AM tomorrow morning. Reviewed the patient's labs and radiographic studies in preparation for this case.    Thornton Park , MD 03/11/2017, 5:16 PM

## 2017-03-11 NOTE — NC FL2 (Signed)
[place] LEVEL OF CARE SCREENING TOOL     IDENTIFICATION  Patient Name: Sonya Gaines Birthdate: [birth year] Sex: female Admission Date (Current Location): 03/10/2017  Industry and [place] Number:  Engineering geologist and Address:  [facility], [address] [place], [place] [address]      Provider Number: [id number]  Attending Physician Name and Address:  Henreitta Leber, MD  Relative Name and Phone Number:       Current Level of Care: Hospital Recommended Level of Care: [place] Prior Approval Number:    Date Approved/Denied:   PASRR Number:  [id number])  Discharge Plan: SNF    Current Diagnoses: Patient Active Problem List   Diagnosis Date Noted  . Closed right hip fracture ([place]) 03/10/2017  . Cough variant asthma 01/25/2015  . GASTROESOPHAGEAL REFLUX DISEASE 04/27/2009  . DYSPNEA 03/20/2009  . HYPERTENSION 01/26/2009  . COUGH, CHRONIC 01/26/2009    Orientation RESPIRATION BLADDER Height & Weight     Self, Time, Situation, Place  Normal Continent Weight: 126 lb 4.8 oz (57.3 kg) Height:  5\' 1"  (154.9 cm)  BEHAVIORAL SYMPTOMS/MOOD NEUROLOGICAL BOWEL NUTRITION STATUS   (None.)  (None. ) Continent Diet (Diet: Regular)  AMBULATORY STATUS COMMUNICATION OF NEEDS Skin   Extensive Assist Verbally Normal                       Personal Care Assistance Level of Assistance  Bathing, Feeding, Dressing Bathing Assistance: Limited assistance Feeding assistance: Independent Dressing Assistance: Limited assistance     Functional Limitations Info  Sight, Hearing, Speech Sight Info: Adequate Hearing Info: Adequate Speech Info: Adequate    SPECIAL CARE FACTORS FREQUENCY  PT (By licensed PT), OT (By licensed OT)     PT Frequency:  (5) OT Frequency:  (5)            Contractures      Additional Factors Info  Code Status, Allergies Code Status Info:  (Full Code) Allergies Info:  (Brimonidine,  Clarithromycin, Clindamycin/lincomycin, Levofloxacin, Penicillins, Polytrim Polymyxin B-trimethoprim, Statins, Sulfonamide Derivatives, Tobramycin)           Current Medications (03/11/2017):  This is the current hospital active medication list Current Facility-Administered Medications  Medication Dose Route Frequency Provider Last Rate Last Dose  . 0.9 %  sodium chloride infusion   Intravenous Continuous Demetrios Loll, MD 50 mL/hr at 03/10/17 2209    . acetaminophen (TYLENOL) tablet 650 mg  650 mg Oral Q6H PRN Demetrios Loll, MD       Or  . acetaminophen (TYLENOL) suppository 650 mg  650 mg Rectal Q6H PRN Demetrios Loll, MD      . bisacodyl (DULCOLAX) EC tablet 5 mg  5 mg Oral Daily PRN Demetrios Loll, MD      . docusate sodium (COLACE) capsule 100 mg  100 mg Oral BID Demetrios Loll, MD   100 mg at 03/11/17 0951  . dorzolamidel-timolol (COSOPT) 22.3-6.8 MG/ML ophthalmic solution SOLN 1 drop  1 drop Right Eye BID Demetrios Loll, MD   1 drop at 03/11/17 0955  . ezetimibe (ZETIA) tablet 10 mg  10 mg Oral Daily Demetrios Loll, MD   10 mg at 03/11/17 0951  . ketorolac (TORADOL) 15 MG/ML injection 15 mg  15 mg Intravenous Q6H PRN Demetrios Loll, MD   15 mg at 03/11/17 0310  . latanoprost (XALATAN) 0.005 % ophthalmic solution 1 drop  1 drop Right Eye QHS Demetrios Loll, MD      .  losartan (COZAAR) tablet 50 mg  50 mg Oral Daily Demetrios Loll, MD   50 mg at 03/11/17 0951  . ondansetron (ZOFRAN) tablet 4 mg  4 mg Oral Q6H PRN Demetrios Loll, MD       Or  . ondansetron [facility]) injection 4 mg  4 mg Intravenous Q6H PRN Demetrios Loll, MD      . oxyCODONE (Oxy IR/ROXICODONE) immediate release tablet 5 mg  5 mg Oral Q4H PRN Demetrios Loll, MD      . pantoprazole (PROTONIX) EC tablet 40 mg  40 mg Oral Daily Demetrios Loll, MD   40 mg at 03/11/17 0951  . senna-docusate (Senokot-S) tablet 1 tablet  1 tablet Oral QHS PRN Demetrios Loll, MD         Discharge Medications: Please see discharge summary for a list of discharge medications.  Relevant Imaging Results:  Relevant  Lab Results:   Additional Information  (SSN: [id number])  Danie Chandler, Student-Social Work

## 2017-03-11 NOTE — Clinical Social Work Placement (Signed)
   CLINICAL SOCIAL WORK PLACEMENT  NOTE  Date:  03/11/2017  Patient Details  Name: Sonya Gaines MRN: [id number] Date of Birth: 01-06-1946  Clinical Social Work is seeking post-discharge placement for this patient at the [place] level of care (*CSW will initial, date and re-position this form in  chart as items are completed):  Yes   Patient/family provided with [place] Work Department's list of facilities offering this level of care within the geographic area requested by the patient (or if unable, by the patient's family).  Yes   Patient/family informed of their freedom to choose among providers that offer the needed level of care, that participate in Medicare, Medicaid or managed care program needed by the patient, have an available bed and are willing to accept the patient.  Yes   Patient/family informed of 's ownership interest in [facility] and [facility], as well as of the fact that they are under no obligation to receive care at these facilities.  PASRR submitted to EDS on 03/11/17     PASRR number received on 03/11/17     Existing PASRR number confirmed on       FL2 transmitted to all facilities in geographic area requested by pt/family on 03/11/17     FL2 transmitted to all facilities within larger geographic area on       Patient informed that his/her managed care company has contracts with or will negotiate with certain facilities, including the following:            Patient/family informed of bed offers received.  Patient chooses bed at       Physician recommends and patient chooses bed at      Patient to be transferred to   on  .  Patient to be transferred to facility by       Patient family notified on   of transfer.  Name of family member notified:        PHYSICIAN       Additional Comment:    _______________________________________________ Danie Chandler, [place] Work 03/11/2017,  10:14 AM

## 2017-03-12 ENCOUNTER — Inpatient Hospital Stay: Payer: Medicare Other

## 2017-03-12 ENCOUNTER — Encounter: Payer: Self-pay | Admitting: Anesthesiology

## 2017-03-12 ENCOUNTER — Inpatient Hospital Stay: Payer: Medicare Other | Admitting: Anesthesiology

## 2017-03-12 ENCOUNTER — Encounter
Admission: EM | Disposition: A | Discharge status: Skilled Nursing Facility | Payer: Self-pay | Source: Home / Self Care | Attending: Internal Medicine

## 2017-03-12 HISTORY — PX: HIP PINNING,CANNULATED: SHX1758

## 2017-03-12 SURGERY — FIXATION, FEMUR, NECK, PERCUTANEOUS, USING SCREW
Anesthesia: General | Laterality: Right

## 2017-03-12 MED ORDER — PROPOFOL 10 MG/ML IV BOLUS
INTRAVENOUS | Status: AC
  Filled 2017-03-12: qty 20

## 2017-03-12 MED ORDER — MENTHOL 3 MG MT LOZG
1.0000 | LOZENGE | OROMUCOSAL | Status: DC | PRN
  Filled 2017-03-12: qty 9

## 2017-03-12 MED ORDER — ONDANSETRON HCL 4 MG/2ML IJ SOLN: 4.0000 mg | Freq: Four times a day (QID) | INTRAMUSCULAR | Status: DC | PRN

## 2017-03-12 MED ORDER — LACTATED RINGERS IV SOLN
INTRAVENOUS | Status: DC | PRN
  Administered 2017-03-12 (×2): via INTRAVENOUS

## 2017-03-12 MED ORDER — MIDAZOLAM HCL 2 MG/2ML IJ SOLN
INTRAMUSCULAR | Status: AC
  Filled 2017-03-12: qty 2

## 2017-03-12 MED ORDER — POLYETHYLENE GLYCOL 3350 17 G PO PACK: 17.0000 g | PACK | Freq: Every day | ORAL | Status: DC | PRN

## 2017-03-12 MED ORDER — ONDANSETRON HCL 4 MG/2ML IJ SOLN
INTRAMUSCULAR | Status: DC | PRN
  Administered 2017-03-12: 4 mg via INTRAVENOUS

## 2017-03-12 MED ORDER — SODIUM CHLORIDE 0.9 % IV SOLN
75.0000 mL/h | INTRAVENOUS | Status: DC
  Administered 2017-03-12: 75 mL/h via INTRAVENOUS

## 2017-03-12 MED ORDER — CEFAZOLIN SODIUM-DEXTROSE 2-4 GM/100ML-% IV SOLN
2.0000 g | INTRAVENOUS | Status: AC
  Administered 2017-03-12: 2 g via INTRAVENOUS

## 2017-03-12 MED ORDER — DEXAMETHASONE SODIUM PHOSPHATE 10 MG/ML IJ SOLN
INTRAMUSCULAR | Status: DC | PRN
  Administered 2017-03-12: 10 mg via INTRAVENOUS

## 2017-03-12 MED ORDER — FENTANYL CITRATE (PF) 100 MCG/2ML IJ SOLN
INTRAMUSCULAR | Status: AC
Start: 2017-03-12 — End: 2017-03-12
  Filled 2017-03-12: qty 2

## 2017-03-12 MED ORDER — ROCURONIUM BROMIDE 100 MG/10ML IV SOLN
INTRAVENOUS | Status: DC | PRN
  Administered 2017-03-12: 40 mg via INTRAVENOUS
  Administered 2017-03-12: 10 mg via INTRAVENOUS

## 2017-03-12 MED ORDER — OCUVITE-LUTEIN PO CAPS
1.0000 | ORAL_CAPSULE | Freq: Two times a day (BID) | ORAL | Status: DC
  Administered 2017-03-12 – 2017-03-14 (×4): 1 via ORAL
  Filled 2017-03-12 (×4): qty 1

## 2017-03-12 MED ORDER — SUGAMMADEX SODIUM 200 MG/2ML IV SOLN
INTRAVENOUS | Status: DC | PRN
  Administered 2017-03-12: 115 mg via INTRAVENOUS

## 2017-03-12 MED ORDER — ENOXAPARIN SODIUM 40 MG/0.4ML ~~LOC~~ SOLN
40.0000 mg | SUBCUTANEOUS | Status: DC
  Administered 2017-03-13 – 2017-03-14 (×2): 40 mg via SUBCUTANEOUS
  Filled 2017-03-12 (×2): qty 0.4

## 2017-03-12 MED ORDER — PHENYLEPHRINE HCL 10 MG/ML IJ SOLN
INTRAMUSCULAR | Status: DC | PRN
  Administered 2017-03-12: 100 ug via INTRAVENOUS

## 2017-03-12 MED ORDER — ONDANSETRON HCL 4 MG PO TABS: 4.0000 mg | ORAL_TABLET | Freq: Four times a day (QID) | ORAL | Status: DC | PRN

## 2017-03-12 MED ORDER — MORPHINE SULFATE (PF) 2 MG/ML IV SOLN: 2.0000 mg | INTRAVENOUS | Status: DC | PRN

## 2017-03-12 MED ORDER — OXYCODONE HCL 5 MG PO TABS
5.0000 mg | ORAL_TABLET | ORAL | Status: DC | PRN
  Administered 2017-03-12 (×2): 5 mg via ORAL
  Filled 2017-03-12 (×2): qty 1

## 2017-03-12 MED ORDER — CEFAZOLIN SODIUM-DEXTROSE 2-4 GM/100ML-% IV SOLN: 2.0000 g | Freq: Four times a day (QID) | INTRAVENOUS | Status: DC

## 2017-03-12 MED ORDER — CEFAZOLIN SODIUM-DEXTROSE 2-4 GM/100ML-% IV SOLN
INTRAVENOUS | Status: AC
  Filled 2017-03-12: qty 100

## 2017-03-12 MED ORDER — KETAMINE HCL 50 MG/ML IJ SOLN
INTRAMUSCULAR | Status: AC
  Filled 2017-03-12: qty 10

## 2017-03-12 MED ORDER — FENTANYL CITRATE (PF) 100 MCG/2ML IJ SOLN
25.0000 ug | INTRAMUSCULAR | Status: DC | PRN
  Administered 2017-03-12 (×2): 25 ug via INTRAVENOUS
  Administered 2017-03-12: 50 ug via INTRAVENOUS

## 2017-03-12 MED ORDER — FENTANYL CITRATE (PF) 100 MCG/2ML IJ SOLN
INTRAMUSCULAR | Status: AC
  Administered 2017-03-12: 25 ug via INTRAVENOUS
  Filled 2017-03-12: qty 2

## 2017-03-12 MED ORDER — PHENYLEPHRINE HCL 10 MG/ML IJ SOLN
INTRAMUSCULAR | Status: AC
  Filled 2017-03-12: qty 1

## 2017-03-12 MED ORDER — EPHEDRINE SULFATE 50 MG/ML IJ SOLN
INTRAMUSCULAR | Status: DC | PRN
  Administered 2017-03-12 (×2): 10 mg via INTRAVENOUS

## 2017-03-12 MED ORDER — SUGAMMADEX SODIUM 200 MG/2ML IV SOLN
INTRAVENOUS | Status: AC
  Filled 2017-03-12: qty 2

## 2017-03-12 MED ORDER — MAGNESIUM CITRATE PO SOLN
1.0000 | Freq: Once | ORAL | Status: DC | PRN
  Filled 2017-03-12: qty 296

## 2017-03-12 MED ORDER — MIDAZOLAM HCL 5 MG/5ML IJ SOLN
INTRAMUSCULAR | Status: DC | PRN
  Administered 2017-03-12: 1 mg via INTRAVENOUS

## 2017-03-12 MED ORDER — DEXTROSE 5 % IV SOLN
2.0000 g | Freq: Four times a day (QID) | INTRAVENOUS | Status: AC
  Administered 2017-03-12 – 2017-03-13 (×2): 2 g via INTRAVENOUS
  Filled 2017-03-12 (×2): qty 2000

## 2017-03-12 MED ORDER — DEXAMETHASONE SODIUM PHOSPHATE 10 MG/ML IJ SOLN
INTRAMUSCULAR | Status: AC
  Filled 2017-03-12: qty 1

## 2017-03-12 MED ORDER — NEOMYCIN-POLYMYXIN B GU 40-200000 IR SOLN
Status: AC
  Filled 2017-03-12: qty 4

## 2017-03-12 MED ORDER — ONDANSETRON HCL 4 MG/2ML IJ SOLN
INTRAMUSCULAR | Status: AC
  Filled 2017-03-12: qty 2

## 2017-03-12 MED ORDER — PROPOFOL 10 MG/ML IV BOLUS
INTRAVENOUS | Status: DC | PRN
  Administered 2017-03-12: 140 mg via INTRAVENOUS

## 2017-03-12 MED ORDER — FENTANYL CITRATE (PF) 100 MCG/2ML IJ SOLN
INTRAMUSCULAR | Status: DC | PRN
  Administered 2017-03-12 (×2): 50 ug via INTRAVENOUS

## 2017-03-12 MED ORDER — LIDOCAINE HCL (CARDIAC) 20 MG/ML IV SOLN
INTRAVENOUS | Status: DC | PRN
  Administered 2017-03-12: 40 mg via INTRAVENOUS

## 2017-03-12 MED ORDER — ONDANSETRON HCL 4 MG/2ML IJ SOLN: 4.0000 mg | Freq: Once | INTRAMUSCULAR | Status: DC | PRN

## 2017-03-12 MED ORDER — PHENOL 1.4 % MT LIQD
1.0000 | OROMUCOSAL | Status: DC | PRN
  Filled 2017-03-12: qty 177

## 2017-03-12 MED ORDER — NEOMYCIN-POLYMYXIN B GU 40-200000 IR SOLN
Status: DC | PRN
  Administered 2017-03-12: 4 mL

## 2017-03-12 SURGICAL SUPPLY — 41 items
BIT DRILL CANN LRG QC 5X300 (BIT) ×3 IMPLANT
BLADE SURG SZ10 CARB STEEL (BLADE) ×3 IMPLANT
BNDG COHESIVE 4X5 TAN STRL (GAUZE/BANDAGES/DRESSINGS) ×3 IMPLANT
BNDG COHESIVE 6X5 TAN STRL LF (GAUZE/BANDAGES/DRESSINGS) ×3 IMPLANT
CANISTER SUCT 1200ML W/VALVE (MISCELLANEOUS) ×3 IMPLANT
CATH TRAY 16F METER LATEX (MISCELLANEOUS) ×3 IMPLANT
DRAPE SURG 17X11 SM STRL (DRAPES) ×6 IMPLANT
DRAPE U-SHAPE 47X51 STRL (DRAPES) ×3 IMPLANT
DRSG OPSITE POSTOP 3X4 (GAUZE/BANDAGES/DRESSINGS) IMPLANT
DRSG OPSITE POSTOP 4X6 (GAUZE/BANDAGES/DRESSINGS) ×3 IMPLANT
DURAPREP 26ML APPLICATOR (WOUND CARE) ×6 IMPLANT
ELECT REM PT RETURN 9FT ADLT (ELECTROSURGICAL) ×3
ELECTRODE REM PT RTRN 9FT ADLT (ELECTROSURGICAL) ×1 IMPLANT
GAUZE SPONGE 4X4 12PLY STRL (GAUZE/BANDAGES/DRESSINGS) IMPLANT
GLOVE BIO SURGEON STRL SZ 6.5 (GLOVE) ×4 IMPLANT
GLOVE BIO SURGEONS STRL SZ 6.5 (GLOVE) ×2
GLOVE BIOGEL PI IND STRL 7.0 (GLOVE) ×1 IMPLANT
GLOVE BIOGEL PI IND STRL 9 (GLOVE) ×1 IMPLANT
GLOVE BIOGEL PI INDICATOR 7.0 (GLOVE) ×2
GLOVE BIOGEL PI INDICATOR 9 (GLOVE) ×2
GLOVE SURG 9.0 ORTHO LTXF (GLOVE) ×6 IMPLANT
GOWN STRL REUS TWL 2XL XL LVL4 (GOWN DISPOSABLE) ×3 IMPLANT
GOWN STRL REUS W/ TWL LRG LVL3 (GOWN DISPOSABLE) ×1 IMPLANT
GOWN STRL REUS W/TWL LRG LVL3 (GOWN DISPOSABLE) ×3
GUIDEWIRE THREADED 2.8 (WIRE) ×9 IMPLANT
HOLDER FOLEY CATH W/STRAP (MISCELLANEOUS) ×3 IMPLANT
MAT BLUE FLOOR 46X72 FLO (MISCELLANEOUS) ×3 IMPLANT
NEEDLE FILTER BLUNT 18X 1/2SAF (NEEDLE) ×2
NEEDLE FILTER BLUNT 18X1 1/2 (NEEDLE) ×1 IMPLANT
NS IRRIG 1000ML POUR BTL (IV SOLUTION) ×3 IMPLANT
PACK HIP COMPR (MISCELLANEOUS) ×3 IMPLANT
SCREW CANN 16 THRD/75 7.3 (Screw) ×3 IMPLANT
SCREW CANN 16 THRD/80 7.3 (Screw) ×6 IMPLANT
SCREW CANN 16 THRD/85 7.3 (Screw) ×3 IMPLANT
SCREW CANN 16 THRD/90 7.3 (Screw) IMPLANT
STAPLER SKIN PROX 35W (STAPLE) ×3 IMPLANT
STRAP SAFETY BODY (MISCELLANEOUS) ×3 IMPLANT
SUT VIC AB 0 CT1 36 (SUTURE) ×3 IMPLANT
SUT VIC AB 2-0 CT2 27 (SUTURE) ×3 IMPLANT
SUT VICRYL 0 AB UR-6 (SUTURE) ×3 IMPLANT
SYRINGE 10CC LL (SYRINGE) ×3 IMPLANT

## 2017-03-12 NOTE — Anesthesia Procedure Notes (Signed)
Procedure Name: Intubation Date/Time: 03/12/2017 11:30 AM Performed by: Allean Found Pre-anesthesia Checklist: Patient identified, Emergency Drugs available, Suction available, Patient being monitored and Timeout performed Patient Re-evaluated:Patient Re-evaluated prior to inductionOxygen Delivery Method: Circle system utilized Preoxygenation: Pre-oxygenation with 100% oxygen Intubation Type: IV induction Ventilation: Mask ventilation without difficulty Laryngoscope Size: Mac and 3 Grade View: Grade II Tube type: Oral Tube size: 7.0 mm Number of attempts: 2 Placement Confirmation: ETT inserted through vocal cords under direct vision,  positive ETCO2 and breath sounds checked- equal and bilateral Secured at: 21 cm Tube secured with: Tape Dental Injury: Teeth and Oropharynx as per pre-operative assessment

## 2017-03-12 NOTE — Progress Notes (Signed)
[place] [facility] [place] NAME: Sonya Gaines    MR#:  [id number]  DATE OF BIRTH:  [birth year]  SUBJECTIVE:   Patient is here with fall and noted to have a right hip fracture. Underwent MRI of the right hip showing a subcapital right hip fracture. Awaited surgery 03/12/17.  REVIEW OF SYSTEMS:    Review of Systems  Constitutional: Negative for chills and fever.  HENT: Negative for congestion and tinnitus.   Eyes: Negative for blurred vision and double vision.  Respiratory: Negative for cough, shortness of breath and wheezing.   Cardiovascular: Negative for chest pain, orthopnea and PND.  Gastrointestinal: Negative for abdominal pain, diarrhea, nausea and vomiting.  Genitourinary: Negative for dysuria and hematuria.  Musculoskeletal: Positive for falls and joint pain (Right Hip Pain).  Neurological: Negative for dizziness, sensory change and focal weakness.  All other systems reviewed and are negative.   Nutrition: Regular Tolerating Diet: Yes Tolerating PT: AWait Eval.    DRUG ALLERGIES:   Allergies  Allergen Reactions  . Brimonidine Rash  . Clarithromycin Rash  . Clindamycin/Lincomycin Swelling and Rash  . Levofloxacin Rash  . Penicillins Rash  . Polytrim [Polymyxin B-Trimethoprim] Rash  . Statins Rash  . Sulfonamide Derivatives Rash  . Tobramycin Rash    VITALS:  Blood pressure (!) 143/65, pulse 70, temperature 98.1 F (36.7 C), temperature source Oral, resp. rate 18, height 5\' 1"  (1.549 m), weight 57.3 kg (126 lb 4.8 oz), SpO2 99 %.  PHYSICAL EXAMINATION:   Physical Exam  GENERAL:  71 y.o.-year-old patient lying in bed in no acute distress.  EYES: Pupils equal, round, reactive to light and accommodation. No scleral icterus. Extraocular muscles intact.  HEENT: Head atraumatic, normocephalic. Oropharynx and nasopharynx clear.  NECK:  Supple, no jugular venous distention. No thyroid enlargement, no tenderness.  LUNGS: Normal  breath sounds bilaterally, no wheezing, rales, rhonchi. No use of accessory muscles of respiration.  CARDIOVASCULAR: S1, S2 normal. No murmurs, rubs, or gallops.  ABDOMEN: Soft, nontender, nondistended. Bowel sounds present. No organomegaly or mass.  EXTREMITIES: No cyanosis, clubbing or edema b/l.    NEUROLOGIC: Cranial nerves II through XII are intact. No focal Motor or sensory deficits b/l.   PSYCHIATRIC: The patient is alert and oriented x 3.  SKIN: No obvious rash, lesion, or ulcer.    LABORATORY PANEL:   CBC  Recent Labs Lab 03/11/17 0354  WBC 6.6  HGB 12.8  HCT 37.0  PLT 174   ------------------------------------------------------------------------------------------------------------------  Chemistries   Recent Labs Lab 03/11/17 0354  NA 144  K 3.7  CL 110  CO2 28  GLUCOSE 104*  BUN 18  CREATININE 0.70  CALCIUM 8.9   ------------------------------------------------------------------------------------------------------------------  Cardiac Enzymes No results for input(s): TROPONINI in the last 168 hours. ------------------------------------------------------------------------------------------------------------------  RADIOLOGY:  Ct Pelvis Wo Contrast  Result Date: 03/10/2017 CLINICAL DATA:  Right hip pain after being pulled down stairs by dog. EXAM: CT PELVIS WITHOUT CONTRAST TECHNIQUE: Multidetector CT imaging of the pelvis was performed following the standard protocol without intravenous contrast. COMPARISON:  None. FINDINGS: Urinary Tract:  No abnormality visualized. Bowel:  Unremarkable visualized pelvic bowel loops. Vascular/Lymphatic: Aortoiliac and branch vessel atherosclerosis. No aneurysm. No lymphadenopathy. Reproductive:  Hysterectomy.  No adnexal mass. Other:  None. Musculoskeletal: Tiny cortical fracture involving the superolateral aspect of the right femoral head- neck junction, coronal series 4, image 28 can't axial series 2, image 78. No  significant joint effusion. Subchondral degenerate cystic change of the acetabular  roofs bilaterally. Pubic rami, sacroiliac joints and sacral ala are intact. IMPRESSION: Tiny cortical fracture at the right femoral head- neck junction superolaterally. No significant displacement or significant impaction. Electronically Signed   By: Ashley Royalty M.D.   On: 03/10/2017 22:20   Mr Hip Right Wo Contrast  Result Date: 03/11/2017 CLINICAL DATA:  The patient was pull downstairs by a dog 03/10/2017. Possible right hip fracture. EXAM: MR OF THE RIGHT HIP WITHOUT CONTRAST TECHNIQUE: Multiplanar, multisequence MR imaging was performed. No intravenous contrast was administered. COMPARISON:  CT abdomen and pelvis 03/10/2017. FINDINGS: Bones: The patient has an acute subcapital fracture of the right hip. The inferior cortex of the hip may be spared. Marrow edema is seen the anterior wall of the right acetabulum with an appearance most consistent with contusion. Small subchondral cysts in the right acetabulum are also noted. Bone marrow signal is otherwise normal. No avascular necrosis of the femoral heads. Sacroiliac joints and symphysis pubis appear normal. Articular cartilage and labrum Articular cartilage:  Mildly degenerated. Labrum: The anterior superior labrum is degenerated without focal tear. Joint or bursal effusion Joint effusion:  Small right hip joint effusion. Bursae:  Negative. Muscles and tendons Muscles and tendons:  Intact. Other findings Miscellaneous: Soft tissue contusion contusion in the fatty tissues over the right hip is noted. IMPRESSION: Acute subcapital fracture right hip appears nondisplaced and may be incomplete with sparing of the inferior cortex of the femoral neck. Contusion in the fatty soft tissues about the right hip is noted. Marrow edema in the anterior wall of the right acetabulum most consistent with bone contusion. 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 Electronically Signed   By:  Inge Rise M.D.   On: 03/11/2017 14:06   Dg Chest Portable 1 View  Result Date: 03/10/2017 CLINICAL DATA:  Preoperative chest radiograph for hip fracture. EXAM: PORTABLE CHEST 1 VIEW COMPARISON:  01/25/2016; 01/27/2009 FINDINGS: Grossly unchanged cardiac silhouette and mediastinal contours. The lungs appear hyperexpanded with flattening of the diaphragms. No focal airspace opacities. No pleural effusion or pneumothorax. No evidence of edema. No definite acute osseus abnormalities. IMPRESSION: Hyperexpanded lungs without superimposed acute cardiopulmonary disease. Electronically Signed   By: Sandi Mariscal M.D.   On: 03/10/2017 20:17   Dg Hip Unilat  With Pelvis 2-3 Views Right  Result Date: 03/10/2017 CLINICAL DATA:  Right hip pain after fall today EXAM: DG HIP (WITH OR WITHOUT PELVIS) 2-3V RIGHT COMPARISON:  None. FINDINGS: Subtle fracture noted on the frog-leg view of the right hip at the junction of the femoral head and neck laterally. No dislocation of either hip. Subtle subcortical sclerosis involving the femoral heads may reflect changes of AVN without flattening. Bony pelvis appears intact. Small ossification noted overlying the right femoral head may represent an intra-articular ossific loose body or potentially overlap from a soft tissue granuloma. There is mild degenerative disc space narrowing at L4-5 and L5-S1 with associated facet arthropathy. IMPRESSION: Subtle nondisplaced fracture at the junction of the femoral head and neck, best appreciated on the frog-leg view. Subchondral sclerosis of the femoral heads bilaterally may reflect early changes of AVN without flattening of the femoral heads. Electronically Signed   By: Ashley Royalty M.D.   On: 03/10/2017 19:24     ASSESSMENT AND PLAN:   71 year old female with past medical history of glaucoma, hyperlipidemia, hypertension, obstructive sleep apnea, osteoporosis who presented to the hospital after a fall and noted to have a right hip  fracture.  1. Status post fall and right  hip fracture-patient underwent an MRI of the hip today which confirmed a subcapital right hip fracture. - surgery planned 03/12/17. -Continue supportive care and pain control for now.  2. Essential hypertension-continue losartan.  3. Glaucoma-continue latanoprost, dorzolamide eyedrops.  4. Hyperlipidemia-continue Zetia  5. GERD - cont. Protonix.    All the records are reviewed and case discussed with Care Management/Social Worker. Management plans discussed with the patient, family and they are in agreement.  CODE STATUS: Full code  DVT Prophylaxis: Ted's & SCD's.   TOTAL TIME TAKING CARE OF THIS PATIENT: 30 minutes.   POSSIBLE D/C IN 2-3 DAYS, DEPENDING ON CLINICAL CONDITION.   Vaughan Basta M.D on 03/12/2017 at 9:05 AM  Between 7am to 6pm - Pager - [phone]  After 6pm go to [email or web address] - [employer] [place] [facility]  Office  [phone]  CC: Primary care physician; Tracie Harrier, MD

## 2017-03-12 NOTE — Anesthesia Post-op Follow-up Note (Cosign Needed)
Anesthesia QCDR form completed.        

## 2017-03-12 NOTE — Transfer of Care (Signed)
Immediate Anesthesia Transfer of Care Note  Patient: Sonya Gaines  Procedure(s) Performed: Procedure(s): CANNULATED HIP PINNING (Right)  Patient Location: PACU  Anesthesia Type:General  Level of Consciousness: awake  Airway & Oxygen Therapy: Patient Spontanous Breathing and Patient connected to face mask oxygen  Post-op Assessment: Report given to RN and Post -op Vital signs reviewed and stable  Post vital signs: Reviewed and stable  Last Vitals:  Vitals:   03/12/17 0719 03/12/17 1048  BP: (!) 151/73 (!) 154/77  Pulse: 74 74  Resp:  18  Temp: 36.9 C 37.1 C    Last Pain:  Vitals:   03/12/17 1048  TempSrc: Tympanic  PainSc:          Complications: No apparent anesthesia complications

## 2017-03-12 NOTE — Progress Notes (Signed)
Picked up care for patient at 1500. A&O x 4. Foley in place. Dressing is CD&I to right hip. Ice to hip. Oral pain meds with good relief. Bed alarm on for safety.

## 2017-03-12 NOTE — Progress Notes (Signed)
Subjective:  POST-OP CHECK:  Patient reports right hip pain as moderate.  The patient is lying in her hospital bed. Family is at the bedside.  Objective:   VITALS:   Vitals:   03/12/17 1340 03/12/17 1354 03/12/17 1423 03/12/17 1535  BP: (!) 152/72 (!) 157/68 (!) 165/71 137/63  Pulse: 64 70 70 73  Resp: 15 15 15    Temp: 97.4 F (36.3 C) 97.8 F (36.6 C)  97.7 F (36.5 C)  TempSrc:  Oral  Oral  SpO2: 100% 100% 100% 100%  Weight:      Height:        PHYSICAL EXAM:  Right lower extremity: Neurovascular intact Sensation intact distally Intact pulses distally Dorsiflexion/Plantar flexion intact Incision: dressing C/D/I No cellulitis present Compartment soft  LABS  No results found for this or any previous visit (from the past 24 hour(s)).  Ct Pelvis Wo Contrast  Result Date: 03/10/2017 CLINICAL DATA:  Right hip pain after being pulled down stairs by dog. EXAM: CT PELVIS WITHOUT CONTRAST TECHNIQUE: Multidetector CT imaging of the pelvis was performed following the standard protocol without intravenous contrast. COMPARISON:  None. FINDINGS: Urinary Tract:  No abnormality visualized. Bowel:  Unremarkable visualized pelvic bowel loops. Vascular/Lymphatic: Aortoiliac and branch vessel atherosclerosis. No aneurysm. No lymphadenopathy. Reproductive:  Hysterectomy.  No adnexal mass. Other:  None. Musculoskeletal: Tiny cortical fracture involving the superolateral aspect of the right femoral head- neck junction, coronal series 4, image 28 can't axial series 2, image 78. No significant joint effusion. Subchondral degenerate cystic change of the acetabular roofs bilaterally. Pubic rami, sacroiliac joints and sacral ala are intact. IMPRESSION: Tiny cortical fracture at the right femoral head- neck junction superolaterally. No significant displacement or significant impaction. Electronically Signed   By: Ashley Royalty M.D.   On: 03/10/2017 22:20   Mr Hip Right Wo Contrast  Result Date:  03/11/2017 CLINICAL DATA:  The patient was pull downstairs by a dog 03/10/2017. Possible right hip fracture. EXAM: MR OF THE RIGHT HIP WITHOUT CONTRAST TECHNIQUE: Multiplanar, multisequence MR imaging was performed. No intravenous contrast was administered. COMPARISON:  CT abdomen and pelvis 03/10/2017. FINDINGS: Bones: The patient has an acute subcapital fracture of the right hip. The inferior cortex of the hip may be spared. Marrow edema is seen the anterior wall of the right acetabulum with an appearance most consistent with contusion. Small subchondral cysts in the right acetabulum are also noted. Bone marrow signal is otherwise normal. No avascular necrosis of the femoral heads. Sacroiliac joints and symphysis pubis appear normal. Articular cartilage and labrum Articular cartilage:  Mildly degenerated. Labrum: The anterior superior labrum is degenerated without focal tear. Joint or bursal effusion Joint effusion:  Small right hip joint effusion. Bursae:  Negative. Muscles and tendons Muscles and tendons:  Intact. Other findings Miscellaneous: Soft tissue contusion contusion in the fatty tissues over the right hip is noted. IMPRESSION: Acute subcapital fracture right hip appears nondisplaced and may be incomplete with sparing of the inferior cortex of the femoral neck. Contusion in the fatty soft tissues about the right hip is noted. Marrow edema in the anterior wall of the right acetabulum most consistent with bone contusion. 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 Electronically Signed   By: Inge Rise M.D.   On: 03/11/2017 14:06   Dg Chest Portable 1 View  Result Date: 03/10/2017 CLINICAL DATA:  Preoperative chest radiograph for hip fracture. EXAM: PORTABLE CHEST 1 VIEW COMPARISON:  01/25/2016; 01/27/2009 FINDINGS: Grossly unchanged cardiac silhouette and mediastinal contours. The  lungs appear hyperexpanded with flattening of the diaphragms. No focal airspace opacities. No pleural effusion or  pneumothorax. No evidence of edema. No definite acute osseus abnormalities. IMPRESSION: Hyperexpanded lungs without superimposed acute cardiopulmonary disease. Electronically Signed   By: Sandi Mariscal M.D.   On: 03/10/2017 20:17   Dg Hip Port Unilat With Pelvis 1v Right  Result Date: 03/12/2017 CLINICAL DATA:  ORIF right hip. EXAM: DG HIP (WITH OR WITHOUT PELVIS) 1V PORT RIGHT COMPARISON:  03/10/2017 . FINDINGS: ORIF right hip. Hardware intact. Anatomic alignment. Degenerative changes lumbar spine and both hips . Stable subchondral changes both femoral heads, these changes again may be degenerative, however avascular necrosis cannot be excluded IMPRESSION: 1.  ORIF right hip.  Hardware intact.  Anatomic alignment. 2. Degenerative changes both hips. 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 No change in appearance from prior study 03/10/2017. Cannot be excluded. Electronically Signed   By: Marcello Moores  Register   On: 03/12/2017 14:56   Dg Hip Operative Unilat W Or W/o Pelvis Right  Result Date: 03/12/2017 CLINICAL DATA:  Right hip pinning. EXAM: OPERATIVE right HIP (WITH PELVIS IF PERFORMED) 2 VIEWS TECHNIQUE: Fluoroscopic spot image(s) were submitted for interpretation post-operatively. FLUOROSCOPY TIME:  1 minutes 26 seconds. COMPARISON:  MRI of March 11, 2017. FINDINGS: Two fluoroscopic images of the right hip demonstrate surgical screws passing through trochanteric region and femoral neck. Good alignment of fracture components is noted. IMPRESSION: Status post surgical pinning of right hip. Electronically Signed   By: Marijo Conception, M.D.   On: 03/12/2017 12:49   Dg Hip Unilat  With Pelvis 2-3 Views Right  Result Date: 03/10/2017 CLINICAL DATA:  Right hip pain after fall today EXAM: DG HIP (WITH OR WITHOUT PELVIS) 2-3V RIGHT COMPARISON:  None. FINDINGS: Subtle fracture noted on the frog-leg view of the right hip at the junction of the femoral head and neck laterally. No dislocation of either  hip. Subtle subcortical sclerosis involving the femoral heads may reflect changes of AVN without flattening. Bony pelvis appears intact. Small ossification noted overlying the right femoral head may represent an intra-articular ossific loose body or potentially overlap from a soft tissue granuloma. There is mild degenerative disc space narrowing at L4-5 and L5-S1 with associated facet arthropathy. IMPRESSION: Subtle nondisplaced fracture at the junction of the femoral head and neck, best appreciated on the frog-leg view. Subchondral sclerosis of the femoral heads bilaterally may reflect early changes of AVN without flattening of the femoral heads. Electronically Signed   By: Ashley Royalty M.D.   On: 03/10/2017 19:24    Assessment/Plan: Day of Surgery   Active Problems:   Closed right hip fracture [facility])   Patient is having right hip pain following surgery. Continue oxycodone and morphine for pain control. Patient will complete 24 hours of postop antibiotics. Labs will be rechecked in the morning. She'll begin physical and occupational therapy tomorrow. Her Foley catheter will be discontinued tomorrow.  Encourage incentive spirometry while awake. I reviewed the patient's postoperative x-rays which demonstrate the fracture remains in an anatomic position.  The cannulated screws are well positioned.   Thornton Park , MD 03/12/2017, 5:45 PM

## 2017-03-12 NOTE — Op Note (Signed)
03/10/2017 - 03/12/2017  12:59 PM  PATIENT:  Sonya Gaines    PRE-OPERATIVE DIAGNOSIS:  non-displaced femoral neck right hip fracture  POST-OPERATIVE DIAGNOSIS:  Same  PROCEDURE:  CANNULATED HIP PINNING, RIGHT HIP  SURGEON:  Thornton Park, MD  ANESTHESIA:   Spinal  PREOPERATIVE INDICATIONS:  Sonya Gaines is a  71 y.o. female who fell and was found to have a diagnosis of a nondisplaced hip fracture right who elected for surgical management.    The risks benefits and alternatives were discussed with the patient preoperatively including but not limited to infection, bleeding, nerve or blood vessel injury, persistent hip pain,  malunion, nonunion, avascular necrosis, change in lower extremity rotation, leg length discrepancy, failure of the hardware and the need for revision surgery, including the potential for conversion to hemi or total hip arthroplasty. Medical risks include but are not limited to: DVT and pulmonary embolism, myocardial infarction, stroke, pneumonia, respiratory failure and death.  The patient understood and agreed with the plan for surgery.  Patient had the right hip marked with the word yes and my initials according the hospitals correct site of surgery protocol.  OPERATIVE IMPLANTS: 7.3 mm cannulated screws x 4  OPERATIVE FINDINGS: Osteoporotic bone of right hip  OPERATIVE PROCEDURE: The patient was brought to the operating room and underwent general anesthesia was administered by the anesthesia service.  She was then placed supine on the fracture table. IV Kefzol was administered without adverse reaction despite a history of a PCN allergy. The right lower extremity was positioned in a leg holder, without any significant reduction maneuver other than mild internal rotation.  The well leg was placed in hemi-lithotomy position. The hip was prepped and draped in usual sterile fashion.  A time out was performed to verify the patient's name, date of birth, medical record  number, correct site of surgery and correct surger to be performed. The  timeout was also used to verify the patient had received antibiotics that all appropriate instruments, implants and radiographic studies were available in the room. Once all in attendance were in agreement case began..  Once the reduction was deemed near-anatomic, a small lateral incision was made in line with the femur, distal to the greater trochanter, and 4 threaded guidewires were introduced Into the lateral cortex of the femur, across the fracture site and into the humeral head in an inverted triangle configuration. The lengths of these guidepins were measured with a depth gauge. The lateral cortex was then opened with a cannulated drill, and then the cannulated screws were advanced into position and tightened by hand. Solid fixation was achieved.  The guide pins were then removed and final C-arm images were taken of the fracture fixation. The fracture was well reduced and the hardware in good position.  The wound was irrigated copiously, and the deep and subcutaneous tissues were repaired with 0 and 2-0 Vicryl suture respectively and the skin was approximated with staples.  I was scrubbed and present the entire case and all sharp and instrument counts were correct at the conclusion of the case.  I spoke with the patient's family in the postop consultation room to let them know the case was completed without complication and the patient was stable in the recovery room.    Timoteo Gaul, MD

## 2017-03-12 NOTE — Anesthesia Preprocedure Evaluation (Addendum)
Anesthesia Evaluation  Patient identified by MRN, date of birth, ID band Patient awake    Reviewed: Allergy & Precautions, H&P , NPO status , Patient's Chart, lab work & pertinent test results, reviewed documented beta blocker date and time   History of Anesthesia Complications Negative for: history of anesthetic complications  Airway Mallampati: I  TM Distance: >3 FB Neck ROM: full    Dental  (+) Caps, Teeth Intact, Dental Advidsory Given Permanent bridge:   Pulmonary neg shortness of breath, asthma , sleep apnea (mild) , neg COPD, neg recent URI, former smoker,           Cardiovascular Exercise Tolerance: Good hypertension, (-) angina(-) CAD, (-) Past MI, (-) Cardiac Stents and (-) CABG (-) dysrhythmias + Valvular Problems/Murmurs      Neuro/Psych negative neurological ROS  negative psych ROS   GI/Hepatic Neg liver ROS, GERD  ,  Endo/Other  negative endocrine ROS  Renal/GU negative Renal ROS  negative genitourinary   Musculoskeletal   Abdominal   Peds  Hematology negative hematology ROS (+)   Anesthesia Other Findings Past Medical History: No date: Glaucoma No date: Hyperlipidemia No date: Hypertension No date: OSA (obstructive sleep apnea)     Comment: not on CPAP  No date: Osteoporosis   Reproductive/Obstetrics negative OB ROS                            Anesthesia Physical Anesthesia Plan  ASA: III  Anesthesia Plan: General   Post-op Pain Management:    Induction:   Airway Management Planned:   Additional Equipment:   Intra-op Plan:   Post-operative Plan:   Informed Consent: I have reviewed the patients History and Physical, chart, labs and discussed the procedure including the risks, benefits and alternatives for the proposed anesthesia with the patient or authorized representative who has indicated his/her understanding and acceptance.   Dental Advisory  Given  Plan Discussed with: Anesthesiologist, CRNA and Surgeon  Anesthesia Plan Comments:        Anesthesia Quick Evaluation

## 2017-03-13 ENCOUNTER — Encounter: Payer: Self-pay | Admitting: Orthopedic Surgery

## 2017-03-13 ENCOUNTER — Encounter
Admission: RE | Admit: 2017-03-13 | Discharge: 2017-03-13 | Disposition: A | Discharge status: Home / Self Care | Payer: Medicare Other | Source: Ambulatory Visit | Attending: Internal Medicine | Admitting: Internal Medicine

## 2017-03-13 LAB — CBC
HCT: 36.3 % (ref 35.0–47.0)
Hemoglobin: 12.5 g/dL (ref 12.0–16.0)
MCH: 29.5 pg (ref 26.0–34.0)
MCHC: 34.4 g/dL (ref 32.0–36.0)
MCV: 85.8 fL (ref 80.0–100.0)
PLATELETS: 176 10*3/uL (ref 150–440)
RBC: 4.23 MIL/uL (ref 3.80–5.20)
RDW: 13.4 % (ref 11.5–14.5)
WBC: 7.8 10*3/uL (ref 3.6–11.0)

## 2017-03-13 LAB — BASIC METABOLIC PANEL
Anion gap: 6 (ref 5–15)
BUN: 10 mg/dL (ref 6–20)
CALCIUM: 8.5 mg/dL — AB (ref 8.9–10.3)
CO2: 28 mmol/L (ref 22–32)
CREATININE: 0.6 mg/dL (ref 0.44–1.00)
Chloride: 105 mmol/L (ref 101–111)
Glucose, Bld: 107 mg/dL — ABNORMAL HIGH (ref 65–99)
Potassium: 4 mmol/L (ref 3.5–5.1)
SODIUM: 139 mmol/L (ref 135–145)

## 2017-03-13 MED ORDER — IBUPROFEN 400 MG PO TABS
400.0000 mg | ORAL_TABLET | Freq: Four times a day (QID) | ORAL | Status: DC | PRN
  Administered 2017-03-14: 400 mg via ORAL
  Filled 2017-03-13: qty 1

## 2017-03-13 MED ORDER — IBUPROFEN 400 MG PO TABS
400.0000 mg | ORAL_TABLET | Freq: Four times a day (QID) | ORAL | Status: DC | PRN
  Administered 2017-03-13: 400 mg via ORAL
  Filled 2017-03-13: qty 1

## 2017-03-13 NOTE — Progress Notes (Signed)
Physical Therapy Treatment Patient Details Name: Sonya Gaines MRN: [id number] DOB: May 01, 1946 Today's Date: 03/13/2017    History of Present Illness 71 y.o. female admitted for a cannulated Hip Pinning (4/15) for a Hip Fracture sustained as a result of a fall.    PT Comments    Pt showed good effort with PT session and though she had some fatigue with ambulation she was fairly confident using UEs on walker and was able to maintain NWBing well.  She showed some weakness and hesitancy with R LE exercises but showed very good motivation and with some minimal AAROM was ultimatley able to do most exercises with AROM or even light resistance.     Follow Up Recommendations  SNF     Equipment Recommendations  Rolling walker with 5" wheels    Recommendations for Other Services       Precautions / Restrictions Precautions Precautions: Fall Restrictions Weight Bearing Restrictions: Yes RLE Weight Bearing: Touchdown weight bearing    Mobility  Bed Mobility Overal bed mobility: Modified Independent             General bed mobility comments: Pt able to lift R LE into bed w/ only minimal UE use.  No assist from PT.   Transfers Overall transfer level: Modified independent Equipment used: Rolling walker (2 wheeled)             General transfer comment: Pt did well getting to standing with less cuing this afternoon, showed good confidence and safety with walker.  Ambulation/Gait Ambulation/Gait assistance: Min guard Ambulation Distance (Feet): 35 Feet Assistive device: Rolling walker (2 wheeled)       General Gait Details: Pt did well using walker, maintaining NWBing through R LE and though she fatigued quickly with the effort she was safe and relatively confident.   Stairs            Wheelchair Mobility    Modified Rankin (Stroke Patients Only)       Balance Overall balance assessment: Modified Independent                                          Cognition Arousal/Alertness: Awake/alert Behavior During Therapy: WFL for tasks assessed/performed Overall Cognitive Status: Within Functional Limits for tasks assessed                                        Exercises General Exercises - Lower Extremity Ankle Circles/Pumps: AROM;10 reps;Strengthening Quad Sets: Strengthening;10 reps Gluteal Sets: Strengthening;10 reps Short Arc Quad: Strengthening;10 reps Heel Slides: AROM;10 reps Hip ABduction/ADduction: Strengthening;10 reps Straight Leg Raises: AROM;10 reps    General Comments        Pertinent Vitals/Pain Pain Assessment: 0-10 Pain Score: 4     Home Living Family/patient expects to be discharged to:: Skilled nursing facility Living Arrangements: Spouse/significant other Available Help at Discharge: Family (Sister will initially stay with her when she does go home)                Prior Function Level of Independence: Independent      Comments: Recently retired, pt. was independent with ADLs, IADLs, driving, medication management, yard work, and pressure washing the house.   PT Goals (current goals can now be found in the care plan section) Acute Rehab PT Goals  Patient Stated Goal: To return home PT Goal Formulation: With patient Time For Goal Achievement: 03/27/17 Potential to Achieve Goals: Fair Progress towards PT goals: Progressing toward goals    Frequency    BID      PT Plan Current plan remains appropriate    Co-evaluation             End of Session Equipment Utilized During Treatment: Gait belt Activity Tolerance: Patient tolerated treatment well Patient left: with call bell/phone within reach;with chair alarm set   PT Visit Diagnosis: Muscle weakness (generalized) (M62.81);Difficulty in walking, not elsewhere classified (R26.2)     Time: 1031-5945 PT Time Calculation (min) (ACUTE ONLY): 26 min  Charges:  $Gait Training: 8-22 mins $Therapeutic Exercise:  8-22 mins                    G Codes:       Kreg Shropshire, DPT 03/13/2017, 4:55 PM

## 2017-03-13 NOTE — Evaluation (Signed)
Physical Therapy Evaluation Patient Details Name: Sonya Gaines MRN: [id number] DOB: [birth year] Today's Date: 03/13/2017   History of Present Illness  71 y.o. female admitted for a cannulated Hip Pinning (4/15) for a Hip Fracture sustained as a result of a fall.  Clinical Impression  Pt showed great effort t/o PT exam and generally did well.  She was somewhat pain limited, but able to use UEs and great effort to get LEs to EOB for sitting, did slow but consistent AA/AROM exercises for ~10 minutes apart from the eval and was able to use UEs very effectively to hop with walker to do some limited "ambulation."  Pt did well, though she does have some inherent limitations secondary to [place] status that will make return to home difficult and likely unsafe at this time.     Follow Up Recommendations SNF    Equipment Recommendations  Rolling walker with 5" wheels    Recommendations for Other Services       Precautions / Restrictions Precautions Precautions: Fall Restrictions Weight Bearing Restrictions: Yes RLE Weight Bearing: Touchdown weight bearing (per ortho verbal and written)      Mobility  Bed Mobility Overal bed mobility: Modified Independent             General bed mobility comments: Pt with heavy use of UEs to move L LE to EOB, but able to rise to sitting EOB w/o assist  Transfers Overall transfer level: Modified independent Equipment used: Rolling walker (2 wheeled)             General transfer comment: Heavy cuing for set up/sequencing - pt able to maintain NWBing during rising to standing  Ambulation/Gait Ambulation/Gait assistance: Min guard Ambulation Distance (Feet): 8 Feet Assistive device: Rolling walker (2 wheeled)       General Gait Details: Pt showed good ability to keep R LE off floor and had good control with UEs/walker.  Stairs            Wheelchair Mobility    Modified Rankin (Stroke Patients Only)       Balance Overall  balance assessment: Modified Independent                                           Pertinent Vitals/Pain Pain Assessment: 0-10 Pain Score: 5  Pain Location: Right Hip  Pain Descriptors / Indicators: Aching Pain Intervention(s): Limited activity within patient's tolerance;Monitored during session    Home Living Family/patient expects to be discharged to:: Skilled nursing facility Living Arrangements: Spouse/significant other Available Help at Discharge: Family (Sister will initially stay with her when she does go home) Type of Home: [place]: One level        Prior Function Level of Independence: Independent         Comments: Recently retired, pt. was independent with ADLs, IADLs, driving, medication management, yard work, and pressure washing the house.     Hand Dominance   Dominant Hand: Right    Extremity/Trunk Assessment   Upper Extremity Assessment Upper Extremity Assessment: Overall WFL for tasks assessed    Lower Extremity Assessment Lower Extremity Assessment: RLE deficits/detail RLE Deficits / Details: Pt initially hesistant to do much with R LE, but after some warm up actually did AROM SLRs       Communication   Communication: No difficulties  Cognition Arousal/Alertness: Awake/alert  Behavior During Therapy: WFL for tasks assessed/performed Overall Cognitive Status: Within Functional Limits for tasks assessed                                        General Comments      Exercises General Exercises - Lower Extremity Ankle Circles/Pumps: AROM;10 reps Quad Sets: Strengthening;10 reps Gluteal Sets: Strengthening;10 reps Short Arc Quad: AROM;10 reps;Strengthening Heel Slides: AROM;5 reps Hip ABduction/ADduction: AROM;Strengthening;10 reps Straight Leg Raises: AROM;5 reps   Assessment/Plan    PT Assessment Patient needs continued PT services  PT Problem List Decreased strength;Decreased range of  motion;Decreased activity tolerance;Decreased balance;Decreased mobility;Decreased coordination;Decreased cognition;Decreased knowledge of use of DME;Decreased safety awareness;Pain;Cardiopulmonary status limiting activity       PT Treatment Interventions Gait training;DME instruction;Stair training;Therapeutic activities;Therapeutic exercise;Balance training;Patient/family education;Functional mobility training    PT Goals (Current goals can be found in the Care Plan section)  Acute Rehab PT Goals Patient Stated Goal: To return home PT Goal Formulation: With patient Time For Goal Achievement: 03/27/17 Potential to Achieve Goals: Fair    Frequency Min 2X/week   Barriers to discharge        Co-evaluation               End of Session Equipment Utilized During Treatment: Gait belt Activity Tolerance: Patient tolerated treatment well Patient left: with call bell/phone within reach;with chair alarm set   PT Visit Diagnosis: Muscle weakness (generalized) (M62.81);Difficulty in walking, not elsewhere classified (R26.2)    Time: 0922-0950 PT Time Calculation (min) (ACUTE ONLY): 28 min   Charges:   PT Evaluation $PT Eval Low Complexity: 1 Procedure PT Treatments $Therapeutic Exercise: 8-22 mins   PT G Codes:        Kreg Shropshire, DPT 03/13/2017, 1:34 PM

## 2017-03-13 NOTE — Progress Notes (Signed)
Subjective:  Postoperative day #1 status post external fixation for right femoral neck hip fracture. Patient reports pain as mild.  Patient is sitting up in a chair. She denies any significant right hip pain. Her family is at the bedside.  Objective:   VITALS:   Vitals:   03/12/17 1843 03/12/17 1945 03/12/17 2310 03/13/17 0744  BP: 132/62 119/61 (!) 118/59 122/63  Pulse: 76 74 64 65  Resp:   18 16  Temp:  98.4 F (36.9 C) 97.6 F (36.4 C) 98 F (36.7 C)  TempSrc:  Oral Oral Oral  SpO2: 100% 98% 99% 100%  Weight:      Height:        PHYSICAL EXAM:  Right lower extremity: Neurovascular intact Sensation intact distally Intact pulses distally Dorsiflexion/Plantar flexion intact Incision: dressing C/D/I No cellulitis present Compartment soft  LABS  Results for orders placed or performed during the hospital encounter of 03/10/17 (from the past 24 hour(s))  CBC     Status: None   Collection Time: 03/13/17  4:45 AM  Result Value Ref Range   WBC 7.8 3.6 - 11.0 K/uL   RBC 4.23 3.80 - 5.20 MIL/uL   Hemoglobin 12.5 12.0 - 16.0 g/dL   HCT 36.3 35.0 - 47.0 %   MCV 85.8 80.0 - 100.0 fL   MCH 29.5 26.0 - 34.0 pg   MCHC 34.4 32.0 - 36.0 g/dL   RDW 13.4 11.5 - 14.5 %   Platelets 176 150 - 440 K/uL  Basic metabolic panel     Status: Abnormal   Collection Time: 03/13/17  4:45 AM  Result Value Ref Range   Sodium 139 135 - 145 mmol/L   Potassium 4.0 3.5 - 5.1 mmol/L   Chloride 105 101 - 111 mmol/L   CO2 28 22 - 32 mmol/L   Glucose, Bld 107 (H) 65 - 99 mg/dL   BUN 10 6 - 20 mg/dL   Creatinine, Ser 0.60 0.44 - 1.00 mg/dL   Calcium 8.5 (L) 8.9 - 10.3 mg/dL   GFR calc non Af Amer >60 >60 mL/min   GFR calc Af Amer >60 >60 mL/min   Anion gap 6 5 - 15    Mr Hip Right Wo Contrast  Result Date: 03/11/2017 CLINICAL DATA:  The patient was pull downstairs by a dog 03/10/2017. Possible right hip fracture. EXAM: MR OF THE RIGHT HIP WITHOUT CONTRAST TECHNIQUE: Multiplanar,  multisequence MR imaging was performed. No intravenous contrast was administered. COMPARISON:  CT abdomen and pelvis 03/10/2017. FINDINGS: Bones: The patient has an acute subcapital fracture of the right hip. The inferior cortex of the hip may be spared. Marrow edema is seen the anterior wall of the right acetabulum with an appearance most consistent with contusion. Small subchondral cysts in the right acetabulum are also noted. Bone marrow signal is otherwise normal. No avascular necrosis of the femoral heads. Sacroiliac joints and symphysis pubis appear normal. Articular cartilage and labrum Articular cartilage:  Mildly degenerated. Labrum: The anterior superior labrum is degenerated without focal tear. Joint or bursal effusion Joint effusion:  Small right hip joint effusion. Bursae:  Negative. Muscles and tendons Muscles and tendons:  Intact. Other findings Miscellaneous: Soft tissue contusion contusion in the fatty tissues over the right hip is noted. IMPRESSION: Acute subcapital fracture right hip appears nondisplaced and may be incomplete with sparing of the inferior cortex of the femoral neck. Contusion in the fatty soft tissues about the right hip is noted. Marrow edema in the anterior  wall of the right acetabulum most consistent with bone contusion. 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 Electronically Signed   By: Inge Rise M.D.   On: 03/11/2017 14:06   Dg Hip Port Unilat With Pelvis 1v Right  Result Date: 03/12/2017 CLINICAL DATA:  ORIF right hip. EXAM: DG HIP (WITH OR WITHOUT PELVIS) 1V PORT RIGHT COMPARISON:  03/10/2017 . FINDINGS: ORIF right hip. Hardware intact. Anatomic alignment. Degenerative changes lumbar spine and both hips . Stable subchondral changes both femoral heads, these changes again may be degenerative, however avascular necrosis cannot be excluded IMPRESSION: 1.  ORIF right hip.  Hardware intact.  Anatomic alignment. 2. Degenerative changes both hips. 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 No change in appearance from prior study 03/10/2017. Cannot be excluded. Electronically Signed   By: Marcello Moores  Register   On: 03/12/2017 14:56   Dg Hip Operative Unilat W Or W/o Pelvis Right  Result Date: 03/12/2017 CLINICAL DATA:  Right hip pinning. EXAM: OPERATIVE right HIP (WITH PELVIS IF PERFORMED) 2 VIEWS TECHNIQUE: Fluoroscopic spot image(s) were submitted for interpretation post-operatively. FLUOROSCOPY TIME:  1 minutes 26 seconds. COMPARISON:  MRI of March 11, 2017. FINDINGS: Two fluoroscopic images of the right hip demonstrate surgical screws passing through trochanteric region and femoral neck. Good alignment of fracture components is noted. IMPRESSION: Status post surgical pinning of right hip. Electronically Signed   By: Marijo Conception, M.D.   On: 03/12/2017 12:49    Assessment/Plan: 1 Day Post-Op   Active Problems:   Closed right hip fracture [facility])   Patient is doing well postop. She is touchdown weightbearing on the right lower extremity. Patient will complete 24 hours postop antibiotics. Her Foley catheter has been removed. She will continue physical therapy and should be encouraged to use incentive spirometry while awake. Patient will follow up with me in 10-14 days after discharge.   Thornton Park , MD 03/13/2017, 11:27 AM

## 2017-03-13 NOTE — Progress Notes (Signed)
[place] [facility] [place] NAME: Sonya Gaines    MR#:  [id number]  DATE OF BIRTH:  03/17/1946  SUBJECTIVE:   Patient is here with fall and noted to have a right hip fracture. Underwent MRI of the right hip showing a subcapital right hip fracture. s/p surgery 03/12/17.  REVIEW OF SYSTEMS:    Review of Systems  Constitutional: Negative for chills and fever.  HENT: Negative for congestion and tinnitus.   Eyes: Negative for blurred vision and double vision.  Respiratory: Negative for cough, shortness of breath and wheezing.   Cardiovascular: Negative for chest pain, orthopnea and PND.  Gastrointestinal: Negative for abdominal pain, diarrhea, nausea and vomiting.  Genitourinary: Negative for dysuria and hematuria.  Musculoskeletal: Positive for falls and joint pain (Right Hip Pain).  Neurological: Negative for dizziness, sensory change and focal weakness.  All other systems reviewed and are negative.   Nutrition: Regular Tolerating Diet: Yes Tolerating PT: AWait Eval.    DRUG ALLERGIES:   Allergies  Allergen Reactions  . Brimonidine Rash  . Clarithromycin Rash  . Clindamycin/Lincomycin Swelling and Rash  . Levofloxacin Rash  . Penicillins Rash  . Polytrim [Polymyxin B-Trimethoprim] Rash  . Statins Rash  . Sulfonamide Derivatives Rash  . Tobramycin Rash    VITALS:  Blood pressure 122/63, pulse 65, temperature 98 F (36.7 C), temperature source Oral, resp. rate 16, height 5\' 1"  (1.549 m), weight 57.2 kg (126 lb), SpO2 100 %.  PHYSICAL EXAMINATION:   Physical Exam  GENERAL:  71 y.o.-year-old patient lying in bed in no acute distress.  EYES: Pupils equal, round, reactive to light and accommodation. No scleral icterus. Extraocular muscles intact.  HEENT: Head atraumatic, normocephalic. Oropharynx and nasopharynx clear.  NECK:  Supple, no jugular venous distention. No thyroid enlargement, no tenderness.  LUNGS: Normal breath sounds  bilaterally, no wheezing, rales, rhonchi. No use of accessory muscles of respiration.  CARDIOVASCULAR: S1, S2 normal. No murmurs, rubs, or gallops.  ABDOMEN: Soft, nontender, nondistended. Bowel sounds present. No organomegaly or mass.  EXTREMITIES: No cyanosis, clubbing or edema b/l.    NEUROLOGIC: Cranial nerves II through XII are intact. No focal Motor or sensory deficits b/l.   PSYCHIATRIC: The patient is alert and oriented x 3.  SKIN: No obvious rash, lesion, or ulcer.    LABORATORY PANEL:   CBC  Recent Labs Lab 03/13/17 0445  WBC 7.8  HGB 12.5  HCT 36.3  PLT 176   ------------------------------------------------------------------------------------------------------------------  Chemistries   Recent Labs Lab 03/13/17 0445  NA 139  K 4.0  CL 105  CO2 28  GLUCOSE 107*  BUN 10  CREATININE 0.60  CALCIUM 8.5*   ------------------------------------------------------------------------------------------------------------------  Cardiac Enzymes No results for input(s): TROPONINI in the last 168 hours. ------------------------------------------------------------------------------------------------------------------  RADIOLOGY:  Mr Hip Right Wo Contrast  Result Date: 03/11/2017 CLINICAL DATA:  The patient was pull downstairs by a dog 03/10/2017. Possible right hip fracture. EXAM: MR OF THE RIGHT HIP WITHOUT CONTRAST TECHNIQUE: Multiplanar, multisequence MR imaging was performed. No intravenous contrast was administered. COMPARISON:  CT abdomen and pelvis 03/10/2017. FINDINGS: Bones: The patient has an acute subcapital fracture of the right hip. The inferior cortex of the hip may be spared. Marrow edema is seen the anterior wall of the right acetabulum with an appearance most consistent with contusion. Small subchondral cysts in the right acetabulum are also noted. Bone marrow signal is otherwise normal. No avascular necrosis of the femoral heads. Sacroiliac joints and  symphysis  pubis appear normal. Articular cartilage and labrum Articular cartilage:  Mildly degenerated. Labrum: The anterior superior labrum is degenerated without focal tear. Joint or bursal effusion Joint effusion:  Small right hip joint effusion. Bursae:  Negative. Muscles and tendons Muscles and tendons:  Intact. Other findings Miscellaneous: Soft tissue contusion contusion in the fatty tissues over the right hip is noted. IMPRESSION: Acute subcapital fracture right hip appears nondisplaced and may be incomplete with sparing of the inferior cortex of the femoral neck. Contusion in the fatty soft tissues about the right hip is noted. Marrow edema in the anterior wall of the right acetabulum most consistent with bone contusion. 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 Electronically Signed   By: Inge Rise M.D.   On: 03/11/2017 14:06   Dg Hip Port Unilat With Pelvis 1v Right  Result Date: 03/12/2017 CLINICAL DATA:  ORIF right hip. EXAM: DG HIP (WITH OR WITHOUT PELVIS) 1V PORT RIGHT COMPARISON:  03/10/2017 . FINDINGS: ORIF right hip. Hardware intact. Anatomic alignment. Degenerative changes lumbar spine and both hips . Stable subchondral changes both femoral heads, these changes again may be degenerative, however avascular necrosis cannot be excluded IMPRESSION: 1.  ORIF right hip.  Hardware intact.  Anatomic alignment. 2. Degenerative changes both hips. 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 No change in appearance from prior study 03/10/2017. Cannot be excluded. Electronically Signed   By: Marcello Moores  Register   On: 03/12/2017 14:56   Dg Hip Operative Unilat W Or W/o Pelvis Right  Result Date: 03/12/2017 CLINICAL DATA:  Right hip pinning. EXAM: OPERATIVE right HIP (WITH PELVIS IF PERFORMED) 2 VIEWS TECHNIQUE: Fluoroscopic spot image(s) were submitted for interpretation post-operatively. FLUOROSCOPY TIME:  1 minutes 26 seconds. COMPARISON:  MRI of March 11, 2017. FINDINGS: Two  fluoroscopic images of the right hip demonstrate surgical screws passing through trochanteric region and femoral neck. Good alignment of fracture components is noted. IMPRESSION: Status post surgical pinning of right hip. Electronically Signed   By: Marijo Conception, M.D.   On: 03/12/2017 12:49     ASSESSMENT AND PLAN:   71 year old female with past medical history of glaucoma, hyperlipidemia, hypertension, obstructive sleep apnea, osteoporosis who presented to the hospital after a fall and noted to have a right hip fracture.  1. Status post fall and right hip fracture-patient underwent an MRI of the hip today which confirmed a subcapital right hip fracture. - surgery done 03/12/17- CANNULATED HIP PINNING, RIGHT HIP. -Continue supportive care and pain control for now. - PT eval today.  2. Essential hypertension-continue losartan.  3. Glaucoma-continue latanoprost, dorzolamide eyedrops.  4. Hyperlipidemia-continue Zetia  5. GERD - cont. Protonix.    All the records are reviewed and case discussed with Care Management/Social Worker. Management plans discussed with the patient, family and they are in agreement.  CODE STATUS: Full code  DVT Prophylaxis: Ted's & SCD's.   TOTAL TIME TAKING CARE OF THIS PATIENT: 30 minutes.   POSSIBLE D/C IN 1-2 DAYS, DEPENDING ON CLINICAL CONDITION.   Vaughan Basta M.D on 03/13/2017 at 8:10 AM  Between 7am to 6pm - Pager - [phone]  After 6pm go to [email or web address] - Technical brewer [facility]  Office  [phone]  CC: Primary care physician; Tracie Harrier, MD

## 2017-03-13 NOTE — Evaluation (Signed)
Occupational Therapy Evaluation Patient Details Name: Sonya Gaines MRN: [id number] DOB: 02/26/1946 Today's Date: 03/13/2017    History of Present Illness Pt. is a 71 y.o. female who was admitted to [facility] for a cannulated Hip Pinning for a Hip Fracture sustained as a result of a fall.   Clinical Impression   Pt. Is a 71 y.o. Female who was admitted to [facility] for a cannultaed Hip Pinning for a right Hip fracture. Pt. And family education was provided about A/E use for LE ADLs, and DME including transfer Tub bench, and home set-up. Pt. presents with weakness, pain, decreased functional mobility, and NonWB status which hinder her ability to complete ADL, and IADL functioning. Pt. could benefit from skilled OT services for ADL training, UE there. Ex., there. activities, and pt. Education about home modification, and DME. Pt. Would benefit from SNF upon discharge with follow-up OT services.     Follow Up Recommendations  SNF    Equipment Recommendations       Recommendations for Other Services       Precautions / Restrictions Restrictions Weight Bearing Restrictions: Yes RLE Weight Bearing: Non weight bearing                                                    ADL either performed or assessed with clinical judgement   ADL Overall ADL's : Needs assistance/impaired Eating/Feeding: Set up   Grooming: Set up           Upper Body Dressing : Set up   Lower Body Dressing: Moderate assistance                 General ADL Comments: Pt. education was provided about A/E use for LE ADLs, Transfer tub bench.     Vision         Perception     Praxis      Pertinent Vitals/Pain Pain Assessment: 0-10 Pain Score: 5  (0 pain at rest, 5 with movement.) Pain Location: Right Hip  Pain Descriptors / Indicators: Aching Pain Intervention(s): Limited activity within patient's tolerance;Monitored during session     Hand Dominance Right   Extremity/Trunk  Assessment Upper Extremity Assessment Upper Extremity Assessment: Overall WFL for tasks assessed           Communication Communication Communication: No difficulties   Cognition Arousal/Alertness: Awake/alert Behavior During Therapy: WFL for tasks assessed/performed Overall Cognitive Status: Within Functional Limits for tasks assessed                                     General Comments       Exercises     Shoulder Instructions      Home Living Family/patient expects to be discharged to:: Private residence Living Arrangements: Spouse/significant other (Husband uses an assistive device.) Available Help at Discharge: Family (Sister) Type of Home: House       Home Layout: One level     Bathroom Shower/Tub: Biomedical scientist: Standard Bathroom Accessibility: Yes How Accessible: Accessible via walker            Prior Functioning/Environment Level of Independence: Independent        Comments: Recently retired, pt. was independent with ADLs, IADLs, driving, medication management, yard work, and  pressure washing the house.        OT Problem List: Decreased strength;Decreased range of motion;Decreased activity tolerance;Decreased knowledge of use of DME or AE;Pain      OT Treatment/Interventions: Self-care/ADL training;Therapeutic exercise;Energy conservation;DME and/or AE instruction;Patient/family education;Therapeutic activities    OT Goals(Current goals can be found in the care plan section) Acute Rehab OT Goals Patient Stated Goal: To return home OT Goal Formulation: With patient Potential to Achieve Goals: Good  OT Frequency: Min 2X/week   Barriers to D/C:            Co-evaluation              End of Session    Activity Tolerance: Patient tolerated treatment well Patient left: in chair;with call bell/phone within reach;with bed alarm set  OT Visit Diagnosis: Muscle weakness (generalized) (M62.81)                 Time: 4680-3212 OT Time Calculation (min): 23 min Charges:  OT General Charges $OT Visit: 1 Procedure OT Evaluation $OT Eval Moderate Complexity: 1 Procedure G-Codes:    Sonya Carina, MS, OTR/L Sonya Gaines 03/13/2017, 12:09 PM

## 2017-03-13 NOTE — Progress Notes (Signed)
Patient is A&Ox4. Minimal complaints of pain this shift, controlled with motrin at this time. Post void of removal of foley. Verbalizes needs, fluid intake WNL.

## 2017-03-13 NOTE — Progress Notes (Signed)
PT is recommending SNF. Clinical Education officer, museum (CSW) met with patient and her sister and friend were at bedside. CSW presented bed offers. Patient chose [facility]. Per [facility] admissions coordinator at [facility] patient can have a private room on the [place] unit at [place]. CSW will continue to follow and assist as needed.   [employer], LCSW [phone]

## 2017-03-13 NOTE — Plan of Care (Signed)
Problem: Pain Managment: Goal: General experience of comfort will improve Outcome: Progressing Minimal complaints of pain, medicated once with ibuprophen

## 2017-03-14 LAB — BASIC METABOLIC PANEL
Anion gap: 6 (ref 5–15)
BUN: 17 mg/dL (ref 6–20)
CALCIUM: 8.8 mg/dL — AB (ref 8.9–10.3)
CO2: 27 mmol/L (ref 22–32)
CREATININE: 0.73 mg/dL (ref 0.44–1.00)
Chloride: 105 mmol/L (ref 101–111)
GFR calc Af Amer: >60 mL/min (ref >60)
GLUCOSE: 88 mg/dL (ref 65–99)
Potassium: 3.9 mmol/L (ref 3.5–5.1)
SODIUM: 138 mmol/L (ref 135–145)

## 2017-03-14 LAB — CBC
HCT: 36.4 % (ref 35.0–47.0)
Hemoglobin: 12.9 g/dL (ref 12.0–16.0)
MCH: 30.2 pg (ref 26.0–34.0)
MCHC: 35.4 g/dL (ref 32.0–36.0)
MCV: 85.4 fL (ref 80.0–100.0)
PLATELETS: 194 10*3/uL (ref 150–440)
RBC: 4.26 MIL/uL (ref 3.80–5.20)
RDW: 13.1 % (ref 11.5–14.5)
WBC: 7.5 10*3/uL (ref 3.6–11.0)

## 2017-03-14 MED ORDER — ACETAMINOPHEN 325 MG PO TABS: 650.0000 mg | ORAL_TABLET | Freq: Four times a day (QID) | ORAL | 0 refills | Status: DC | PRN

## 2017-03-14 MED ORDER — OXYCODONE HCL 5 MG PO TABS: 5.0000 mg | ORAL_TABLET | Freq: Four times a day (QID) | ORAL | 0 refills | Status: DC | PRN

## 2017-03-14 MED ORDER — SENNOSIDES-DOCUSATE SODIUM 8.6-50 MG PO TABS: 1.0000 | ORAL_TABLET | Freq: Every evening | ORAL | 0 refills | Status: DC | PRN

## 2017-03-14 MED ORDER — IBUPROFEN 400 MG PO TABS: 400.0000 mg | ORAL_TABLET | Freq: Four times a day (QID) | ORAL | 0 refills | Status: DC | PRN

## 2017-03-14 MED ORDER — BISACODYL 5 MG PO TBEC: 5.0000 mg | DELAYED_RELEASE_TABLET | Freq: Every day | ORAL | 0 refills | Status: DC | PRN

## 2017-03-14 MED ORDER — ENOXAPARIN SODIUM 40 MG/0.4ML ~~LOC~~ SOLN: 40.0000 mg | SUBCUTANEOUS | 0 refills | Status: DC

## 2017-03-14 MED ORDER — MAGNESIUM CITRATE PO SOLN: 1.0000 | Freq: Once | ORAL | 0 refills | Status: DC | PRN

## 2017-03-14 NOTE — Progress Notes (Signed)
Patient is medically stable for D/C to [facility] today. Per [facility] admissions coordinator at [facility] patient can come today to private room 350. RN will call report at [phone]. Patient's sister Manuela Schwartz and friend Ginger will transport patient in private vehicle. Clinical Education officer, museum (CSW) sent D/C orders to [employer] via [place]. Patient is aware of above. CSW contacted patient's daughter [name] and made her aware of above. Please reconsult if future social work needs arise. CSW signing off.   [employer], LCSW [phone]

## 2017-03-14 NOTE — Clinical Social Work Placement (Signed)
   CLINICAL SOCIAL WORK PLACEMENT  NOTE  Date:  03/14/2017  Patient Details  Name: Sonya Gaines MRN: [id number] Date of Birth: [birth year]  Clinical Social Work is seeking post-discharge placement for this patient at the [place] level of care (*CSW will initial, date and re-position this form in  chart as items are completed):  Yes   Patient/family provided with [place] Work Department's list of facilities offering this level of care within the geographic area requested by the patient (or if unable, by the patient's family).  Yes   Patient/family informed of their freedom to choose among providers that offer the needed level of care, that participate in Medicare, Medicaid or managed care program needed by the patient, have an available bed and are willing to accept the patient.  Yes   Patient/family informed of [facility]'s ownership interest in [facility] and [facility], as well as of the fact that they are under no obligation to receive care at these facilities.  PASRR submitted to EDS on 03/11/17     PASRR number received on 03/11/17     Existing PASRR number confirmed on       FL2 transmitted to all facilities in geographic area requested by pt/family on 03/11/17     FL2 transmitted to all facilities within larger geographic area on       Patient informed that his/her managed care company has contracts with or will negotiate with certain facilities, including the following:        Yes   Patient/family informed of bed offers received.  Patient chooses bed at  [facility] )     Physician recommends and patient chooses bed at      Patient to be transferred to  [facility] ) on 03/14/17.  Patient to be transferred to facility by  (Patient's sister Manuela Schwartz and friend Ginger will transport patient today to [facility] in personal vehicle. )     Patient family notified on 03/14/17 of transfer.  Name of family member notified:    (Patient's daughter [name] is aware of D/C today. )     PHYSICIAN       Additional Comment:    _______________________________________________ Cordell Guercio, Veronia Beets, LCSW 03/14/2017, 10:37 AM

## 2017-03-14 NOTE — Care Management Important Message (Signed)
Important Message  Patient Details  Name: [name] MRN: [id number] Date of Birth: 05/13/1946   Medicare Important Message Given:  Yes    Jolly Mango, RN 03/14/2017, 9:02 AM

## 2017-03-14 NOTE — Discharge Summary (Signed)
[facility] [place] NAME: Sonya Gaines    MR#:  [id number]  DATE OF BIRTH:  1946-05-05  DATE OF ADMISSION:  03/10/2017 ADMITTING PHYSICIAN: Demetrios Loll, MD  DATE OF DISCHARGE: 03/14/2017  PRIMARY CARE PHYSICIAN: Tracie Harrier, MD    ADMISSION DIAGNOSIS:  Hip pain [M25.559] Closed fracture of right hip, initial encounter ([facility]) [S72.001A]  DISCHARGE DIAGNOSIS:  Active Problems:   Closed right hip fracture (HCC)   SECONDARY DIAGNOSIS:   Past Medical History:  Diagnosis Date  . Glaucoma   . Hyperlipidemia   . Hypertension   . OSA (obstructive sleep apnea)    not on CPAP   . Osteoporosis     HOSPITAL COURSE:   71 year old female with past medical history of glaucoma, hyperlipidemia, hypertension, obstructive sleep apnea, osteoporosis who presented to the hospital after a fall and noted to have a right hip fracture.  1. Status post fall and right hip fracture-patient underwent an MRI of the hip today which confirmed a subcapital right hip fracture. - surgery done 03/12/17- CANNULATED HIP PINNING, RIGHT HIP. -Continue supportive care and pain control for now. - PT eval done- suggested rehab- arranged.  2. Essential hypertension-continue losartan.  3. Glaucoma-continue latanoprost, dorzolamide eyedrops.  4. Hyperlipidemia-continue Zetia  5. GERD - cont. Protonix.   DISCHARGE CONDITIONS:   Stable.  CONSULTS OBTAINED:    DRUG ALLERGIES:   Allergies  Allergen Reactions  . Brimonidine Rash  . Clarithromycin Rash  . Clindamycin/Lincomycin Swelling and Rash  . Levofloxacin Rash  . Penicillins Rash  . Polytrim [Polymyxin B-Trimethoprim] Rash  . Statins Rash  . Sulfonamide Derivatives Rash  . Tobramycin Rash    DISCHARGE MEDICATIONS:   Current Discharge Medication List    START taking these medications   Details  acetaminophen (TYLENOL) 325 MG tablet Take 2 tablets (650 mg total) by mouth every 6  (six) hours as needed for mild pain (or Fever >/= 101). Qty: 15 tablet, Refills: 0    bisacodyl (DULCOLAX) 5 MG EC tablet Take 1 tablet (5 mg total) by mouth daily as needed for moderate constipation. Qty: 30 tablet, Refills: 0    enoxaparin (LOVENOX) 40 MG/0.4ML injection Inject 0.4 mLs (40 mg total) into the skin daily. Qty: 12 Syringe, Refills: 0    ibuprofen (ADVIL,MOTRIN) 400 MG tablet Take 1 tablet (400 mg total) by mouth every 6 (six) hours as needed for moderate pain. Qty: 30 tablet, Refills: 0    magnesium citrate SOLN Take 296 mLs (1 Bottle total) by mouth once as needed for severe constipation. Qty: 195 mL, Refills: 0    oxyCODONE (OXY IR/ROXICODONE) 5 MG immediate release tablet Take 1 tablet (5 mg total) by mouth every 6 (six) hours as needed for severe pain or breakthrough pain ((for MODERATE breakthrough pain)). Qty: 20 tablet, Refills: 0    senna-docusate (SENOKOT-S) 8.6-50 MG tablet Take 1 tablet by mouth at bedtime as needed for mild constipation. Qty: 15 tablet, Refills: 0      CONTINUE these medications which have NOT CHANGED   Details  alendronate (FOSAMAX) 70 MG tablet Take 70 mg by mouth once a week. Take with a full glass of water on an empty stomach.    calcium-vitamin D (SM CALCIUM 500/VITAMIN D3) 500-400 MG-UNIT tablet Take 1 tablet by mouth at bedtime.    dorzolamide-timolol (COSOPT) 22.3-6.8 MG/ML ophthalmic solution Place 1 drop into the right eye 2 (two) times daily.     esomeprazole (NEXIUM) 40 MG  capsule Take 40 mg by mouth daily.    ezetimibe (ZETIA) 10 MG tablet Take 10 mg by mouth daily.    latanoprost (XALATAN) 0.005 % ophthalmic solution Place 1 drop into the right eye at bedtime.     losartan (COZAAR) 50 MG tablet Take 50 mg by mouth daily.    !! Multiple Vitamins-Minerals (PRESERVISION AREDS) TABS Take 1 tablet by mouth 2 (two) times daily.     !! Multiple Vitamins-Minerals (WOMENS 50+ MULTI VITAMIN/MIN) TABS Take 1 tablet by mouth  daily.    budesonide-formoterol (SYMBICORT) 80-4.5 MCG/ACT inhaler Take 2 puffs first thing in am and then another 2 puffs about 12 hours later. Qty: 1 Inhaler, Refills: 12    clotrimazole-betamethasone (LOTRISONE) cream Apply to affected area 2 times daily Qty: 15 g, Refills: 1    cyproheptadine (PERIACTIN) 4 MG tablet Take 1 tablet (4 mg total) by mouth 3 (three) times daily as needed for allergies. Qty: 30 tablet, Refills: 0     !! - Potential duplicate medications found. Please discuss with provider.       DISCHARGE INSTRUCTIONS:    Follow with ortho clinic in 2 weeks.  If you experience worsening of your admission symptoms, develop shortness of breath, life threatening emergency, suicidal or homicidal thoughts you must seek medical attention immediately by calling 911 or calling your MD immediately  if symptoms less severe.  You Must read complete instructions/literature along with all the possible adverse reactions/side effects for all the Medicines you take and that have been prescribed to you. Take any new Medicines after you have completely understood and accept all the possible adverse reactions/side effects.   Please note  You were cared for by a hospitalist during your hospital stay. If you have any questions about your discharge medications or the care you received while you were in the hospital after you are discharged, you can call the unit and asked to speak with the hospitalist on call if the hospitalist that took care of you is not available. Once you are discharged, your primary care physician will handle any further medical issues. Please note that NO REFILLS for any discharge medications will be authorized once you are discharged, as it is imperative that you return to your primary care physician (or establish a relationship with a primary care physician if you do not have one) for your aftercare needs so that they can reassess your need for medications and monitor  your lab values.    Today   CHIEF COMPLAINT:   Chief Complaint  Patient presents with  . Fall    HISTORY OF PRESENT ILLNESS:  [name]  is a 71 y.o. female with a known history of Hypertension, hyperlipidemia, osteoporosis and OSA. The patient fell by accident at home today. She hit her right hip, cannot move and developed right hip pain. Also hit her right elbow. But she denies any pain in her right elbow. She denies any syncope, loss of consciousness or seizure. X-ray show right hip fracture.   VITAL SIGNS:  Blood pressure (!) 153/91, pulse 84, temperature 98.3 F (36.8 C), temperature source Oral, resp. rate 19, height 5\' 1"  (1.549 m), weight 57.2 kg (126 lb), SpO2 94 %.  I/O:   Intake/Output Summary (Last 24 hours) at 03/14/17 0925 Last data filed at 03/14/17 0412  Gross per 24 hour  Intake              480 ml  Output  0 ml  Net              480 ml    PHYSICAL EXAMINATION:   GENERAL:  71 y.o.-year-old patient lying in bed in no acute distress.  EYES: Pupils equal, round, reactive to light and accommodation. No scleral icterus. Extraocular muscles intact.  HEENT: Head atraumatic, normocephalic. Oropharynx and nasopharynx clear.  NECK:  Supple, no jugular venous distention. No thyroid enlargement, no tenderness.  LUNGS: Normal breath sounds bilaterally, no wheezing, rales, rhonchi. No use of accessory muscles of respiration.  CARDIOVASCULAR: S1, S2 normal. No murmurs, rubs, or gallops.  ABDOMEN: Soft, nontender, nondistended. Bowel sounds present. No organomegaly or mass.  EXTREMITIES: No cyanosis, clubbing or edema b/l.    NEUROLOGIC: Cranial nerves II through XII are intact. No focal Motor or sensory deficits b/l.   PSYCHIATRIC: The patient is alert and oriented x 3.  SKIN: No obvious rash, lesion, or ulcer.    DATA REVIEW:   CBC  Recent Labs Lab 03/14/17 0522  WBC 7.5  HGB 12.9  HCT 36.4  PLT 194    Chemistries   Recent Labs Lab  03/14/17 0522  NA 138  K 3.9  CL 105  CO2 27  GLUCOSE 88  BUN 17  CREATININE 0.73  CALCIUM 8.8*    Cardiac Enzymes No results for input(s): TROPONINI in the last 168 hours.  Microbiology Results  Results for orders placed or performed during the hospital encounter of 03/10/17  Surgical PCR screen     Status: None   Collection Time: 03/10/17  9:52 PM  Result Value Ref Range Status   MRSA, PCR NEGATIVE NEGATIVE Final   Staphylococcus aureus NEGATIVE NEGATIVE Final    Comment:        The Xpert SA Assay (FDA approved for NASAL specimens in patients over 34 years of age), is one component of a comprehensive surveillance program.  Test performance has been validated by [facility] for patients greater than or equal to 36 year old. It is not intended to diagnose infection nor to guide or monitor treatment.     RADIOLOGY:  Dg Hip Port Unilat With Pelvis 1v Right  Result Date: 03/12/2017 CLINICAL DATA:  ORIF right hip. EXAM: DG HIP (WITH OR WITHOUT PELVIS) 1V PORT RIGHT COMPARISON:  03/10/2017 . FINDINGS: ORIF right hip. Hardware intact. Anatomic alignment. Degenerative changes lumbar spine and both hips . Stable subchondral changes both femoral heads, these changes again may be degenerative, however avascular necrosis cannot be excluded IMPRESSION: 1.  ORIF right hip.  Hardware intact.  Anatomic alignment. 2. Degenerative changes both hips. 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 No change in appearance from prior study 03/10/2017. Cannot be excluded. Electronically Signed   By: Marcello Moores  Register   On: 03/12/2017 14:56   Dg Hip Operative Unilat W Or W/o Pelvis Right  Result Date: 03/12/2017 CLINICAL DATA:  Right hip pinning. EXAM: OPERATIVE right HIP (WITH PELVIS IF PERFORMED) 2 VIEWS TECHNIQUE: Fluoroscopic spot image(s) were submitted for interpretation post-operatively. FLUOROSCOPY TIME:  1 minutes 26 seconds. COMPARISON:  MRI of March 11, 2017. FINDINGS: Two  fluoroscopic images of the right hip demonstrate surgical screws passing through trochanteric region and femoral neck. Good alignment of fracture components is noted. IMPRESSION: Status post surgical pinning of right hip. Electronically Signed   By: Marijo Conception, M.D.   On: 03/12/2017 12:49    EKG:   Orders placed or performed during the hospital encounter of 03/10/17  .  ED EKG  . ED EKG  . EKG 12-Lead  . EKG 12-Lead      Management plans discussed with the patient, family and they are in agreement.  CODE STATUS:     Code Status Orders        Start     Ordered   03/10/17 2149  Full code  Continuous     03/10/17 2148    Code Status History    Date Active Date Inactive Code Status Order ID Comments User Context   This patient has a current code status but no historical code status.      TOTAL TIME TAKING CARE OF THIS PATIENT: 35 minutes.    Vaughan Basta M.D on 03/14/2017 at 9:25 AM  Between 7am to 6pm - Pager - [phone]  After 6pm go to [email or web address] - password [email or web address] [facility]  Office  [phone]  CC: Primary care physician; Tracie Harrier, MD   Note: This dictation was prepared with Dragon dictation along with smaller phrase technology. Any transcriptional errors that result from this process are unintentional.

## 2017-03-14 NOTE — Progress Notes (Signed)
Subjective:  POD #2 s/p percutaneous pinning for right femoral neck hip fracture.  Patient reports pain as mild to moderate.  Patient feels tired today.  Her sister is at the bedside.    Objective:   VITALS:   Vitals:   03/13/17 0744 03/13/17 1503 03/13/17 2347 03/14/17 0728  BP: 122/63 112/65 134/64 (!) 153/91  Pulse: 65 71 66 84  Resp: 16  18 19   Temp: 98 F (36.7 C) 97.7 F (36.5 C) 98.4 F (36.9 C) 98.3 F (36.8 C)  TempSrc: Oral Oral Oral Oral  SpO2: 100% 99% 96% 94%  Weight:      Height:        PHYSICAL EXAM:  Right lower extremity: Neurovascular intact Sensation intact distally Intact pulses distally Dorsiflexion/Plantar flexion intact Incision: dressing C/D/I No cellulitis present Compartment soft  LABS  Results for orders placed or performed during the hospital encounter of 03/10/17 (from the past 24 hour(s))  CBC     Status: None   Collection Time: 03/14/17  5:22 AM  Result Value Ref Range   WBC 7.5 3.6 - 11.0 K/uL   RBC 4.26 3.80 - 5.20 MIL/uL   Hemoglobin 12.9 12.0 - 16.0 g/dL   HCT 36.4 35.0 - 47.0 %   MCV 85.4 80.0 - 100.0 fL   MCH 30.2 26.0 - 34.0 pg   MCHC 35.4 32.0 - 36.0 g/dL   RDW 13.1 11.5 - 14.5 %   Platelets 194 150 - 440 K/uL  Basic metabolic panel     Status: Abnormal   Collection Time: 03/14/17  5:22 AM  Result Value Ref Range   Sodium 138 135 - 145 mmol/L   Potassium 3.9 3.5 - 5.1 mmol/L   Chloride 105 101 - 111 mmol/L   CO2 27 22 - 32 mmol/L   Glucose, Bld 88 65 - 99 mg/dL   BUN 17 6 - 20 mg/dL   Creatinine, Ser 0.73 0.44 - 1.00 mg/dL   Calcium 8.8 (L) 8.9 - 10.3 mg/dL   GFR calc non Af Amer >60 >60 mL/min   GFR calc Af Amer >60 >60 mL/min   Anion gap 6 5 - 15    Dg Hip Port Unilat With Pelvis 1v Right  Result Date: 03/12/2017 CLINICAL DATA:  ORIF right hip. EXAM: DG HIP (WITH OR WITHOUT PELVIS) 1V PORT RIGHT COMPARISON:  03/10/2017 . FINDINGS: ORIF right hip. Hardware intact. Anatomic alignment. Degenerative changes  lumbar spine and both hips . Stable subchondral changes both femoral heads, these changes again may be degenerative, however avascular necrosis cannot be excluded IMPRESSION: 1.  ORIF right hip.  Hardware intact.  Anatomic alignment. 2. Degenerative changes both hips. 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 No change in appearance from prior study 03/10/2017. Cannot be excluded. Electronically Signed   By: Marcello Moores  Register   On: 03/12/2017 14:56   Dg Hip Operative Unilat W Or W/o Pelvis Right  Result Date: 03/12/2017 CLINICAL DATA:  Right hip pinning. EXAM: OPERATIVE right HIP (WITH PELVIS IF PERFORMED) 2 VIEWS TECHNIQUE: Fluoroscopic spot image(s) were submitted for interpretation post-operatively. FLUOROSCOPY TIME:  1 minutes 26 seconds. COMPARISON:  MRI of March 11, 2017. FINDINGS: Two fluoroscopic images of the right hip demonstrate surgical screws passing through trochanteric region and femoral neck. Good alignment of fracture components is noted. IMPRESSION: Status post surgical pinning of right hip. Electronically Signed   By: Marijo Conception, M.D.   On: 03/12/2017 12:49    Assessment/Plan: 2 Days Post-Op  Active Problems:   Closed right hip fracture [facility])  Patient will be discharged to [facility] today.  She is touch down weight bearing on the right lower extremity.  She will be discharged on lovenox for DVT prophylaxis.  Follow up with me in 10-14 days in the office.    Thornton Park , MD 03/14/2017, 11:52 AM

## 2017-03-14 NOTE — Anesthesia Postprocedure Evaluation (Signed)
Anesthesia Post Note  Patient: Sonya Gaines  Procedure(s) Performed: Procedure(s) (LRB): CANNULATED HIP PINNING (Right)  Patient location during evaluation: PACU Anesthesia Type: General Level of consciousness: awake and alert Pain management: pain level controlled Vital Signs Assessment: post-procedure vital signs reviewed and stable Respiratory status: spontaneous breathing, nonlabored ventilation, respiratory function stable and patient connected to nasal cannula oxygen Cardiovascular status: blood pressure returned to baseline and stable Postop Assessment: no signs of nausea or vomiting Anesthetic complications: no     Last Vitals:  Vitals:   03/13/17 2347 03/14/17 0728  BP: 134/64 (!) 153/91  Pulse: 66 84  Resp: 18 19  Temp: 36.9 C 36.8 C    Last Pain:  Vitals:   03/14/17 1051  TempSrc:   PainSc: 1                  Martha Clan

## 2017-03-18 ENCOUNTER — Encounter
Admission: RE | Admit: 2017-03-18 | Discharge: 2017-03-18 | Disposition: A | Discharge status: Home / Self Care | Payer: Medicare Other | Source: Ambulatory Visit | Attending: Internal Medicine | Admitting: Internal Medicine

## 2017-06-10 ENCOUNTER — Other Ambulatory Visit: Payer: Self-pay | Admitting: Internal Medicine

## 2017-06-10 DIAGNOSIS — Z1231 Encounter for screening mammogram for malignant neoplasm of breast: Secondary | ICD-10-CM

## 2017-07-22 ENCOUNTER — Ambulatory Visit
Admission: RE | Admit: 2017-07-22 | Discharge: 2017-07-22 | Disposition: A | Discharge status: Home / Self Care | Payer: Medicare Other | Source: Ambulatory Visit | Attending: Internal Medicine | Admitting: Internal Medicine

## 2017-07-22 DIAGNOSIS — Z1231 Encounter for screening mammogram for malignant neoplasm of breast: Secondary | ICD-10-CM | POA: Diagnosis present

## 2018-02-13 DIAGNOSIS — F411 Generalized anxiety disorder: Secondary | ICD-10-CM | POA: Insufficient documentation

## 2018-10-19 ENCOUNTER — Other Ambulatory Visit: Payer: Self-pay | Admitting: Internal Medicine

## 2018-10-19 DIAGNOSIS — Z1231 Encounter for screening mammogram for malignant neoplasm of breast: Secondary | ICD-10-CM

## 2018-12-11 ENCOUNTER — Ambulatory Visit
Admission: RE | Admit: 2018-12-11 | Discharge: 2018-12-11 | Disposition: A | Discharge status: Home / Self Care | Payer: Medicare Other | Source: Ambulatory Visit | Attending: Internal Medicine | Admitting: Internal Medicine

## 2018-12-11 DIAGNOSIS — Z1231 Encounter for screening mammogram for malignant neoplasm of breast: Secondary | ICD-10-CM | POA: Insufficient documentation

## 2019-02-01 ENCOUNTER — Emergency Department
Admission: EM | Admit: 2019-02-01 | Discharge: 2019-02-01 | Disposition: A | Discharge status: Home / Self Care | Payer: Medicare Other | Attending: Emergency Medicine | Admitting: Emergency Medicine

## 2019-02-01 ENCOUNTER — Emergency Department: Payer: Medicare Other

## 2019-02-01 ENCOUNTER — Other Ambulatory Visit: Payer: Self-pay

## 2019-02-01 ENCOUNTER — Encounter: Payer: Self-pay | Admitting: Emergency Medicine

## 2019-02-01 DIAGNOSIS — Z79899 Other long term (current) drug therapy: Secondary | ICD-10-CM | POA: Insufficient documentation

## 2019-02-01 DIAGNOSIS — J449 Chronic obstructive pulmonary disease, unspecified: Secondary | ICD-10-CM | POA: Diagnosis not present

## 2019-02-01 DIAGNOSIS — Z87891 Personal history of nicotine dependence: Secondary | ICD-10-CM | POA: Insufficient documentation

## 2019-02-01 DIAGNOSIS — I10 Essential (primary) hypertension: Secondary | ICD-10-CM | POA: Diagnosis present

## 2019-02-01 HISTORY — DX: Chronic obstructive pulmonary disease, unspecified: J44.9

## 2019-02-01 HISTORY — DX: Cardiac murmur, unspecified: R01.1

## 2019-02-01 HISTORY — DX: Unspecified macular degeneration: H35.30

## 2019-02-01 LAB — BASIC METABOLIC PANEL
ANION GAP: 8 (ref 5–15)
BUN: 17 mg/dL (ref 8–23)
CALCIUM: 9.8 mg/dL (ref 8.9–10.3)
CO2: 29 mmol/L (ref 22–32)
CREATININE: 0.6 mg/dL (ref 0.44–1.00)
Chloride: 101 mmol/L (ref 98–111)
GLUCOSE: 99 mg/dL (ref 70–99)
Potassium: 4.1 mmol/L (ref 3.5–5.1)
Sodium: 138 mmol/L (ref 135–145)

## 2019-02-01 LAB — TROPONIN I

## 2019-02-01 LAB — CBC
HCT: 41.7 % (ref 36.0–46.0)
Hemoglobin: 13.7 g/dL (ref 12.0–15.0)
MCH: 29.1 pg (ref 26.0–34.0)
MCHC: 32.9 g/dL (ref 30.0–36.0)
MCV: 88.7 fL (ref 80.0–100.0)
NRBC: 0 % (ref 0.0–0.2)
PLATELETS: 252 10*3/uL (ref 150–400)
RBC: 4.7 MIL/uL (ref 3.87–5.11)
RDW: 13.2 % (ref 11.5–15.5)
WBC: 5.5 10*3/uL (ref 4.0–10.5)

## 2019-02-01 MED ORDER — HYDRALAZINE HCL 10 MG PO TABS: 10.0000 mg | ORAL_TABLET | Freq: Two times a day (BID) | ORAL | 0 refills | Status: DC

## 2019-02-01 MED ORDER — HYDRALAZINE HCL 10 MG PO TABS
10.0000 mg | ORAL_TABLET | Freq: Once | ORAL | Status: AC
  Administered 2019-02-01: 10 mg via ORAL
  Filled 2019-02-01: qty 1

## 2019-02-01 NOTE — ED Provider Notes (Signed)
[facility] Emergency Department Provider Note  ____________________________________________  Time seen: Approximately 8:57 AM  I have reviewed the triage vital signs and the nursing notes.   HISTORY  Chief Complaint Wheezing and Hypertension   HPI JAYCEE MCKELLIPS is a 73 y.o. female with a history of COPD, OSA, hypertension, hyperlipidemia, glaucoma and macular degeneration who presents for evaluation of elevated blood pressure.  Patient reports that she has been struggling with cough and wheezing for the last several months.  Has been seen by her pulmonologist.  Has been on and off steroids.  She reports her pulmonologist puts her on it but her eye doctor takes her off of it because it is increasing her eye pressure.  She saw her pulmonologist 3 days ago and was started on antibiotics for bronchitis, inhalers, and given a prescription for prednisone.  She has an appointment with her eye doctor yesterday and has not started the prednisone yet.  Since she has been sick her blood pressure has been going up progressively.  She was initially on 25 of losartan and had that increased by her primary care doctor to 50 mg.  She was told to come to the emergency room if the blood pressure did not respond to the new dose.  She reports that her blood pressure has been persistently in the 190s to the 200s at home.  She denies headache or dizziness, chest pain or shortness of breath, fever or chills.  She endorses compliance with her losartan and took it this morning.  Her wheezing is unchanged from baseline.  Past Medical History:  Diagnosis Date  . COPD (chronic obstructive pulmonary disease) ([place])   . Glaucoma   . Heart murmur   . Hyperlipidemia   . Hypertension   . Macular degeneration   . OSA (obstructive sleep apnea)    not on CPAP   . Osteoporosis     Patient Active Problem List   Diagnosis Date Noted  . Closed right hip fracture ([place]) 03/10/2017  . Cough variant  asthma 01/25/2015  . GASTROESOPHAGEAL REFLUX DISEASE 04/27/2009  . DYSPNEA 03/20/2009  . HYPERTENSION 01/26/2009  . COUGH, CHRONIC 01/26/2009    Past Surgical History:  Procedure Laterality Date  . BREAST CYST ASPIRATION Right YRS AGO  . GLAUCOMA SURGERY     x 2   . HIP PINNING,CANNULATED Right 03/12/2017   Procedure: CANNULATED HIP PINNING;  Surgeon: Thornton Park, MD;  Location: [facility] ORS;  Service: Orthopedics;  Laterality: Right;  . NASAL SINUS SURGERY     x 3   . VESICOVAGINAL FISTULA CLOSURE W/ TAH      Prior to Admission medications   Medication Sig Start Date End Date Taking? Authorizing Provider  acetaminophen (TYLENOL) 325 MG tablet Take 2 tablets (650 mg total) by mouth every 6 (six) hours as needed for mild pain (or Fever >/= 101). Patient not taking: Reported on 02/01/2019 03/14/17   Vaughan Basta, MD  alendronate (FOSAMAX) 70 MG tablet Take 70 mg by mouth once a week. Take with a full glass of water on an empty stomach.    [provider]  bisacodyl (DULCOLAX) 5 MG EC tablet Take 1 tablet (5 mg total) by mouth daily as needed for moderate constipation. Patient not taking: Reported on 02/01/2019 03/14/17   Vaughan Basta, MD  budesonide-formoterol [facility]) 80-4.5 MCG/ACT inhaler Take 2 puffs first thing in am and then another 2 puffs about 12 hours later. Patient not taking: Reported on 03/10/2017 01/25/15  Tanda Rockers, MD  calcium-vitamin D (SM CALCIUM 500/VITAMIN D3) 500-400 MG-UNIT tablet Take 1 tablet by mouth at bedtime.    [provider]  clotrimazole-betamethasone (LOTRISONE) cream Apply to affected area 2 times daily Patient not taking: Reported on 03/10/2017 04/17/16   Sable Feil, PA-C  cyproheptadine (PERIACTIN) 4 MG tablet Take 1 tablet (4 mg total) by mouth 3 (three) times daily as needed for allergies. Patient not taking: Reported on 03/10/2017 04/17/16   Sable Feil, PA-C  dorzolamide-timolol (COSOPT) 22.3-6.8 MG/ML  ophthalmic solution Place 1 drop into the right eye 2 (two) times daily.     [provider]  enoxaparin (LOVENOX) 40 MG/0.4ML injection Inject 0.4 mLs (40 mg total) into the skin daily. 03/14/17 03/26/17  Vaughan Basta, MD  esomeprazole (NEXIUM) 40 MG capsule Take 40 mg by mouth daily. 11/05/16   [provider]  ezetimibe (ZETIA) 10 MG tablet Take 10 mg by mouth daily.    [provider]  hydrALAZINE (APRESOLINE) 10 MG tablet Take 1 tablet (10 mg total) by mouth 2 (two) times daily. 02/01/19 02/01/20  Rudene Re, MD  ibuprofen (ADVIL,MOTRIN) 400 MG tablet Take 1 tablet (400 mg total) by mouth every 6 (six) hours as needed for moderate pain. 03/14/17   Vaughan Basta, MD  latanoprost (XALATAN) 0.005 % ophthalmic solution Place 1 drop into the right eye at bedtime.     [provider]  losartan (COZAAR) 50 MG tablet Take 50 mg by mouth daily.    [provider]  magnesium citrate SOLN Take 296 mLs (1 Bottle total) by mouth once as needed for severe constipation. 03/14/17   Vaughan Basta, MD  Multiple Vitamins-Minerals (PRESERVISION AREDS) TABS Take 1 tablet by mouth 2 (two) times daily.     [provider]  Multiple Vitamins-Minerals (WOMENS 50+ MULTI VITAMIN/MIN) TABS Take 1 tablet by mouth daily.    [provider]  oxyCODONE (OXY IR/ROXICODONE) 5 MG immediate release tablet Take 1 tablet (5 mg total) by mouth every 6 (six) hours as needed for severe pain or breakthrough pain ((for MODERATE breakthrough pain)). 03/14/17   Vaughan Basta, MD  senna-docusate (SENOKOT-S) 8.6-50 MG tablet Take 1 tablet by mouth at bedtime as needed for mild constipation. 03/14/17   Vaughan Basta, MD    Allergies Apraclonidine; Cheese; Chocolate flavor; Lac bovis; Brimonidine; Clarithromycin; Clindamycin/lincomycin; Levofloxacin; Nickel; Penicillins; Polytrim [polymyxin b-trimethoprim]; Statins; Sulfamethoxazole;  Sulfonamide derivatives; and Tobramycin  Family History  Problem Relation Age of Onset  . Emphysema Father        smoked  . Heart disease Father   . Heart disease Brother   . Breast cancer Maternal Aunt   . Lung cancer Paternal Grandfather        never smoker  . Lung cancer Son        never smoker    Social History Social History   Tobacco Use  . Smoking status: Former Smoker    Packs/day: 1.50    Years: 30.00    Pack years: 45.00    Types: Cigarettes    Last attempt to quit: 11/19/1991    Years since quitting: 27.2  . Smokeless tobacco: Never Used  Substance Use Topics  . Alcohol use: No    Alcohol/week: 0.0 standard drinks  . Drug use: No    Review of Systems  Constitutional: Negative for fever. Eyes: Negative for visual changes. ENT: Negative for sore throat. Neck: No neck pain  Cardiovascular: Negative for chest pain. Respiratory:  Negative for shortness of breath. + wheezing, cough Gastrointestinal: Negative for abdominal pain, vomiting or diarrhea. Genitourinary: Negative for dysuria. Musculoskeletal: Negative for back pain. Skin: Negative for rash. Neurological: Negative for headaches, weakness or numbness. Psych: No SI or HI  ____________________________________________   PHYSICAL EXAM:  VITAL SIGNS: ED Triage Vitals  Enc Vitals Group     BP 02/01/19 0818 (!) 215/89     Pulse Rate 02/01/19 0818 66     Resp 02/01/19 0818 18     Temp 02/01/19 0818 97.7 F (36.5 C)     Temp Source 02/01/19 0818 Oral     SpO2 02/01/19 0818 100 %     Weight 02/01/19 0819 111 lb (50.3 kg)     Height 02/01/19 0819 5\' 1"  (1.549 m)     Head Circumference --      Peak Flow --      Pain Score 02/01/19 0818 0     Pain Loc --      Pain Edu? --      Excl. in [place]? --     Constitutional: Alert and oriented. Well appearing and in no apparent distress. HEENT:      Head: Normocephalic and atraumatic.         Eyes: Conjunctivae are normal. Sclera is non-icteric.        Mouth/Throat: Mucous membranes are moist.       Neck: Supple with no signs of meningismus. Cardiovascular: Regular rate and rhythm. No murmurs, gallops, or rubs. 2+ symmetrical distal pulses are present in all extremities. No JVD. Respiratory: Normal respiratory effort. Lungs are clear to auscultation bilaterally. No wheezes, crackles, or rhonchi.  Gastrointestinal: Soft, non tender, and non distended with positive bowel sounds. No rebound or guarding. Genitourinary: No CVA tenderness. Musculoskeletal: Nontender with normal range of motion in all extremities. No edema, cyanosis, or erythema of extremities. Neurologic: Normal speech and language. Face is symmetric. Moving all extremities. No gross focal neurologic deficits are appreciated. Skin: Skin is warm, dry and intact. No rash noted. Psychiatric: Mood and affect are normal. Speech and behavior are normal.  ____________________________________________   LABS (all labs ordered are listed, but only abnormal results are displayed)  Labs Reviewed  BASIC METABOLIC PANEL  CBC  TROPONIN I   ____________________________________________  EKG  ED ECG REPORT I, Rudene Re, the attending physician, personally viewed and interpreted this ECG.  Normal sinus rhythm, rate of 60, normal intervals, normal axis, no ST elevations or depressions.  Normal EKG. ____________________________________________  RADIOLOGY  I have personally reviewed the images performed during this visit and I agree with the Radiologist's read.   Interpretation by Radiologist:  Dg Chest 2 View  Result Date: 02/01/2019 CLINICAL DATA:  Wheezing EXAM: CHEST - 2 VIEW COMPARISON:  03/10/2017 chest radiograph. FINDINGS: Stable cardiomediastinal silhouette with normal heart size. No pneumothorax. No pleural effusion. Emphysema. Mildly hyperinflated lungs. No pulmonary edema. No acute consolidative airspace disease. IMPRESSION: 1. Emphysema and mildly hyperinflated  lungs, suggesting COPD. 2. No acute cardiopulmonary disease. Electronically Signed   By: Ilona Sorrel M.D.   On: 02/01/2019 08:57     ____________________________________________   PROCEDURES  Procedure(s) performed: None Procedures Critical Care performed:  None ____________________________________________   INITIAL IMPRESSION / ASSESSMENT AND PLAN / ED COURSE  73 y.o. female with a history of COPD, OSA, hypertension, hyperlipidemia, glaucoma and macular degeneration who presents for evaluation of elevated blood pressure.  Patient has been on and off prednisone for bronchitis, currently on 50 mg of  losartan.  Blood pressure in the emergency room is 215/89.  Patient reports the pressure has been this high at home.  Neurologically intact otherwise.  EKG with no acute ischemic changes.  Her blood pressure is most likely a side effect of prednisone and possible eyedrops (not sure which when she is taking but some of them can definitely affect her blood pressure).  Will start patient on hydralazine 10 mg.  Will check labs to evaluate for endorgan damage.  At this time patient has no wheezing, normal work of breathing, normal sats, and good air movement bilaterally.  Chest x-ray showing hyperinflation with no pulmonary edema or pneumonia.  Clinical Course as of Jan 31 1121  Mon Feb 01, 2019  1120 Blood pressure has improved significantly.  Has come down 50 points systolic.  Labs with no endorgan damage.  Patient remains well-appearing.  Will discharge home on hydralazine plus losartan.  Recommended keeping a blood pressure diary and close follow-up with her primary care doctor.  Discussed and return precautions.   [CV]    Clinical Course User Index [CV] Alfred Levins [place], MD     As part of my medical decision making, I reviewed the following data within the [place] notes reviewed and incorporated, Labs reviewed , EKG interpreted , Old EKG reviewed, Old chart  reviewed, Radiograph reviewed , Notes from prior ED visits and [place] Controlled Substance Database    Pertinent labs & imaging results that were available during my care of the patient were reviewed by me and considered in my medical decision making (see chart for details).    ____________________________________________   FINAL CLINICAL IMPRESSION(S) / ED DIAGNOSES  Final diagnoses:  Essential hypertension      NEW MEDICATIONS STARTED DURING THIS VISIT:  ED Discharge Orders         Ordered    hydrALAZINE (APRESOLINE) 10 MG tablet  2 times daily     02/01/19 1121           Note:  This document was prepared using Dragon voice recognition software and may include unintentional dictation errors.    Alfred Levins, [place], MD 02/01/19 1122

## 2019-02-01 NOTE — ED Triage Notes (Signed)
Has been seeing dr Ginette Pitman and pulmonologist since November.  Says has been dealing with wheezing.  Says bp keeps going up.  Says was told to come to ED if worse.

## 2019-02-01 NOTE — ED Notes (Signed)
Hydralazine not in pyxis. Pharmacy no answer; sent electronic message

## 2019-02-15 ENCOUNTER — Ambulatory Visit: Admit: 2019-02-15 | Payer: Medicare Other | Admitting: Unknown Physician Specialty

## 2019-02-15 SURGERY — COLONOSCOPY WITH PROPOFOL
Anesthesia: General

## 2019-06-22 ENCOUNTER — Encounter: Admission: RE | Admit: 2019-06-22 | Payer: Medicare Other | Source: Ambulatory Visit

## 2019-07-09 ENCOUNTER — Other Ambulatory Visit
Admission: RE | Admit: 2019-07-09 | Discharge: 2019-07-09 | Disposition: A | Discharge status: Home / Self Care | Payer: Medicare Other | Source: Ambulatory Visit | Attending: Internal Medicine | Admitting: Internal Medicine

## 2019-07-09 ENCOUNTER — Other Ambulatory Visit: Payer: Self-pay

## 2019-07-09 DIAGNOSIS — Z20828 Contact with and (suspected) exposure to other viral communicable diseases: Secondary | ICD-10-CM | POA: Insufficient documentation

## 2019-07-09 DIAGNOSIS — Z01812 Encounter for preprocedural laboratory examination: Secondary | ICD-10-CM | POA: Diagnosis not present

## 2019-07-09 LAB — SARS CORONAVIRUS 2 (TAT 6-24 HRS): SARS Coronavirus 2: NEGATIVE

## 2019-07-13 ENCOUNTER — Encounter: Payer: Self-pay | Admitting: *Deleted

## 2019-07-14 ENCOUNTER — Encounter
Admission: RE | Disposition: A | Discharge status: Home / Self Care | Payer: Self-pay | Source: Home / Self Care | Attending: Internal Medicine

## 2019-07-14 ENCOUNTER — Other Ambulatory Visit: Payer: Self-pay

## 2019-07-14 ENCOUNTER — Ambulatory Visit: Payer: Medicare Other | Admitting: Anesthesiology

## 2019-07-14 ENCOUNTER — Ambulatory Visit
Admission: RE | Admit: 2019-07-14 | Discharge: 2019-07-14 | Disposition: A | Discharge status: Home / Self Care | Payer: Medicare Other | Attending: Internal Medicine | Admitting: Internal Medicine

## 2019-07-14 DIAGNOSIS — H409 Unspecified glaucoma: Secondary | ICD-10-CM | POA: Diagnosis not present

## 2019-07-14 DIAGNOSIS — Z7983 Long term (current) use of bisphosphonates: Secondary | ICD-10-CM | POA: Insufficient documentation

## 2019-07-14 DIAGNOSIS — J449 Chronic obstructive pulmonary disease, unspecified: Secondary | ICD-10-CM | POA: Diagnosis not present

## 2019-07-14 DIAGNOSIS — M810 Age-related osteoporosis without current pathological fracture: Secondary | ICD-10-CM | POA: Diagnosis not present

## 2019-07-14 DIAGNOSIS — Z86010 Personal history of colonic polyps: Secondary | ICD-10-CM | POA: Insufficient documentation

## 2019-07-14 DIAGNOSIS — G4733 Obstructive sleep apnea (adult) (pediatric): Secondary | ICD-10-CM | POA: Insufficient documentation

## 2019-07-14 DIAGNOSIS — Z7901 Long term (current) use of anticoagulants: Secondary | ICD-10-CM | POA: Insufficient documentation

## 2019-07-14 DIAGNOSIS — I10 Essential (primary) hypertension: Secondary | ICD-10-CM | POA: Diagnosis not present

## 2019-07-14 DIAGNOSIS — Z09 Encounter for follow-up examination after completed treatment for conditions other than malignant neoplasm: Secondary | ICD-10-CM | POA: Diagnosis not present

## 2019-07-14 DIAGNOSIS — D125 Benign neoplasm of sigmoid colon: Secondary | ICD-10-CM | POA: Insufficient documentation

## 2019-07-14 DIAGNOSIS — Z79899 Other long term (current) drug therapy: Secondary | ICD-10-CM | POA: Insufficient documentation

## 2019-07-14 DIAGNOSIS — K219 Gastro-esophageal reflux disease without esophagitis: Secondary | ICD-10-CM | POA: Diagnosis not present

## 2019-07-14 DIAGNOSIS — K635 Polyp of colon: Secondary | ICD-10-CM | POA: Insufficient documentation

## 2019-07-14 DIAGNOSIS — Z7951 Long term (current) use of inhaled steroids: Secondary | ICD-10-CM | POA: Diagnosis not present

## 2019-07-14 DIAGNOSIS — K640 First degree hemorrhoids: Secondary | ICD-10-CM | POA: Insufficient documentation

## 2019-07-14 DIAGNOSIS — E785 Hyperlipidemia, unspecified: Secondary | ICD-10-CM | POA: Insufficient documentation

## 2019-07-14 HISTORY — DX: Gastro-esophageal reflux disease without esophagitis: K21.9

## 2019-07-14 HISTORY — DX: Unspecified asthma, uncomplicated: J45.909

## 2019-07-14 HISTORY — PX: COLONOSCOPY WITH PROPOFOL: SHX5780

## 2019-07-14 SURGERY — COLONOSCOPY WITH PROPOFOL
Anesthesia: General

## 2019-07-14 MED ORDER — SODIUM CHLORIDE 0.9 % IV SOLN
INTRAVENOUS | Status: DC
  Administered 2019-07-14: 14:00:00 via INTRAVENOUS

## 2019-07-14 MED ORDER — PROPOFOL 10 MG/ML IV BOLUS
INTRAVENOUS | Status: AC
  Filled 2019-07-14: qty 20

## 2019-07-14 MED ORDER — PROPOFOL 10 MG/ML IV BOLUS
INTRAVENOUS | Status: DC | PRN
  Administered 2019-07-14: 30 mg via INTRAVENOUS
  Administered 2019-07-14: 20 mg via INTRAVENOUS
  Administered 2019-07-14: 30 mg via INTRAVENOUS

## 2019-07-14 MED ORDER — PROPOFOL 500 MG/50ML IV EMUL
INTRAVENOUS | Status: AC
  Filled 2019-07-14: qty 50

## 2019-07-14 MED ORDER — PROPOFOL 500 MG/50ML IV EMUL
INTRAVENOUS | Status: DC | PRN
  Administered 2019-07-14: 180 ug/kg/min via INTRAVENOUS

## 2019-07-14 NOTE — Anesthesia Post-op Follow-up Note (Signed)
Anesthesia QCDR form completed.        

## 2019-07-14 NOTE — Anesthesia Postprocedure Evaluation (Signed)
Anesthesia Post Note  Patient: Sonya Gaines  Procedure(s) Performed: COLONOSCOPY WITH PROPOFOL (N/A )  Patient location during evaluation: Endoscopy Anesthesia Type: General Level of consciousness: awake and alert and oriented Pain management: pain level controlled Vital Signs Assessment: post-procedure vital signs reviewed and stable Respiratory status: spontaneous breathing, nonlabored ventilation and respiratory function stable Cardiovascular status: blood pressure returned to baseline and stable Postop Assessment: no signs of nausea or vomiting Anesthetic complications: no     Last Vitals:  Vitals:   07/14/19 1450 07/14/19 1500  BP: (!) 97/47 107/79  Pulse: 67 82  Resp: 16 (!) 21  Temp: (!) 36.1 C   SpO2: 100% 98%    Last Pain:  Vitals:   07/14/19 1450  TempSrc: Tympanic  PainSc:                  [name]

## 2019-07-14 NOTE — Interval H&P Note (Signed)
History and Physical Interval Note:  07/14/2019 1:31 PM  Sonya Gaines  has presented today for surgery, with the diagnosis of PH POLYPS.  The various methods of treatment have been discussed with the patient and family. After consideration of risks, benefits and other options for treatment, the patient has consented to  Procedure(s): COLONOSCOPY WITH PROPOFOL (N/A) as a surgical intervention.  The patient's history has been reviewed, patient examined, no change in status, stable for surgery.  I have reviewed the patient's chart and labs.  Questions were answered to the patient's satisfaction.     [place], [place]

## 2019-07-14 NOTE — Transfer of Care (Signed)
Immediate Anesthesia Transfer of Care Note  Patient: Sonya Gaines  Procedure(s) Performed: COLONOSCOPY WITH PROPOFOL (N/A )  Patient Location: PACU  Anesthesia Type:General  Level of Consciousness: sedated  Airway & Oxygen Therapy: Patient Spontanous Breathing and Patient connected to nasal cannula oxygen  Post-op Assessment: Report given to RN and Post -op Vital signs reviewed and stable  Post vital signs: Reviewed and stable  Last Vitals:  Vitals Value Taken Time  BP 97/47 07/14/19 1456  Temp 36.1 C 07/14/19 1450  Pulse 75 07/14/19 1501  Resp 17 07/14/19 1501  SpO2 100 % 07/14/19 1501  Vitals shown include unvalidated device data.  Last Pain:  Vitals:   07/14/19 1450  TempSrc: Tympanic  PainSc:          Complications: No apparent anesthesia complications

## 2019-07-14 NOTE — Anesthesia Preprocedure Evaluation (Signed)
Anesthesia Evaluation  Patient identified by MRN, date of birth, ID band Patient awake    Reviewed: Allergy & Precautions, NPO status , Patient's Chart, lab work & pertinent test results  History of Anesthesia Complications Negative for: history of anesthetic complications  Airway Mallampati: II  TM Distance: >3 FB Neck ROM: Full    Dental  (+) Implants   Pulmonary asthma , sleep apnea , COPD,  COPD inhaler, former smoker,    breath sounds clear to auscultation- rhonchi (-) wheezing      Cardiovascular hypertension, Pt. on medications (-) CAD, (-) Past MI, (-) Cardiac Stents and (-) CABG  Rhythm:Regular Rate:Normal - Systolic murmurs and - Diastolic murmurs    Neuro/Psych neg Seizures negative neurological ROS  negative psych ROS   GI/Hepatic Neg liver ROS, GERD  ,  Endo/Other  negative endocrine ROSneg diabetes  Renal/GU negative Renal ROS     Musculoskeletal negative musculoskeletal ROS (+)   Abdominal (+) - obese,   Peds  Hematology negative hematology ROS (+)   Anesthesia Other Findings Past Medical History: No date: Asthma     Comment:  Slight No date: COPD (chronic obstructive pulmonary disease) ([facility]) No date: GERD (gastroesophageal reflux disease) No date: Glaucoma No date: Heart murmur No date: Hyperlipidemia No date: Hypertension No date: Macular degeneration No date: OSA (obstructive sleep apnea)     Comment:  not on CPAP  No date: Osteoporosis   Reproductive/Obstetrics                             Anesthesia Physical Anesthesia Plan  ASA: III  Anesthesia Plan: General   Post-op Pain Management:    Induction: Intravenous  PONV Risk Score and Plan: 2 and Propofol infusion  Airway Management Planned: Natural Airway  Additional Equipment:   Intra-op Plan:   Post-operative Plan:   Informed Consent: I have reviewed the patients History and Physical, chart,  labs and discussed the procedure including the risks, benefits and alternatives for the proposed anesthesia with the patient or authorized representative who has indicated his/her understanding and acceptance.     Dental advisory given  Plan Discussed with: CRNA and Anesthesiologist  Anesthesia Plan Comments:         Anesthesia Quick Evaluation

## 2019-07-14 NOTE — Op Note (Signed)
[facility] Gastroenterology Patient Name: Dandre Baringer Procedure Date: 07/14/2019 2:14 PM MRN: [id number] Account #: [id number] Date of Birth: October 22, 1946 Admit Type: Outpatient Age: 73 Room: [facility] ENDO ROOM 3 Gender: Female Note Status: Finalized Procedure:            Colonoscopy Indications:          High risk colon cancer surveillance: Personal history                        of multiple (3 or more) adenomas Providers:            Benay Pike. Alice Reichert MD, MD Referring MD:         Tracie Harrier, MD (Referring MD) Medicines:            Propofol per Anesthesia Complications:        No immediate complications. Procedure:            Pre-Anesthesia Assessment:                       - The risks and benefits of the procedure and the                        sedation options and risks were discussed with the                        patient. All questions were answered and informed                        consent was obtained.                       - Patient identification and proposed procedure were                        verified prior to the procedure by the nurse. The                        procedure was verified in the procedure room.                       - ASA Grade Assessment: III - A patient with severe                        systemic disease.                       - After reviewing the risks and benefits, the patient                        was deemed in satisfactory condition to undergo the                        procedure.                       After obtaining informed consent, the colonoscope was                        passed under direct vision. Throughout the procedure,  the patient's blood pressure, pulse, and oxygen                        saturations were monitored continuously. The                        Colonoscope was introduced through the anus and                        advanced to the the cecum, identified by appendiceal               orifice and ileocecal valve. The colonoscopy was                        performed without difficulty. The patient tolerated the                        procedure well. The quality of the bowel preparation                        was adequate. The ileocecal valve, appendiceal orifice,                        and rectum were photographed. Findings:      The perianal and digital rectal examinations were normal. Pertinent       negatives include normal sphincter tone.      Two semi-pedunculated polyps were found in the sigmoid colon and cecum.       The polyps were 5 to 8 mm in size. These polyps were removed with a cold       snare. Resection and retrieval were complete.      Non-bleeding internal hemorrhoids were found during retroflexion. The       hemorrhoids were Grade I (internal hemorrhoids that do not prolapse).      The exam was otherwise without abnormality. Impression:           - Two 5 to 8 mm polyps in the sigmoid colon and in the                        cecum, removed with a cold snare. Resected and                        retrieved.                       - Non-bleeding internal hemorrhoids.                       - The examination was otherwise normal. Recommendation:       - Patient has a contact number available for                        emergencies. The signs and symptoms of potential                        delayed complications were discussed with the patient.                        Return to normal activities tomorrow. Written discharge  instructions were provided to the patient.                       - Resume previous diet.                       - Continue present medications.                       - If benign adenomas without villous component or                        serrated quality, will advise NO FURTHER COLON CANCER                        SCREENING.                       - Return to GI office PRN.                       - The findings  and recommendations were discussed with                        the patient. Procedure Code(s):    --- Professional ---                       [phone], Colonoscopy, flexible; with removal of tumor(s),                        polyp(s), or other lesion(s) by snare technique Diagnosis Code(s):    --- Professional ---                       K64.0, First degree hemorrhoids                       K63.5, Polyp of colon                       Z86.010, Personal history of colonic polyps CPT copyright 2019 [employer]. All rights reserved. The codes documented in this report are preliminary and upon coder review may  be revised to meet current compliance requirements. Efrain Sella MD, MD 07/14/2019 2:52:14 PM This report has been signed electronically. Number of Addenda: 0 Note Initiated On: 07/14/2019 2:14 PM Scope Withdrawal Time: 0 hours 9 minutes 8 seconds  Total Procedure Duration: 0 hours 13 minutes 1 second  Estimated Blood Loss: Estimated blood loss: none.      [facility]

## 2019-07-14 NOTE — H&P (Signed)
Outpatient short stay form Pre-procedure 07/14/2019 11:47 AM Saree Krogh K. Alice Reichert, M.D.  Primary Physician:  Tracie Harrier, M.D.  Reason for visit:  Personal hx of adenomatous colon polyps  History of present illness:                            Patient presents for colonoscopy for a personal hx of colon polyps. The patient denies abdominal pain, abnormal weight loss or rectal bleeding.    No current facility-administered medications for this encounter.   Current Outpatient Medications:  .  albuterol (VENTOLIN HFA) 108 (90 Base) MCG/ACT inhaler, Inhale 2 puffs into the lungs every 6 (six) hours as needed for wheezing or shortness of breath., Disp: , Rfl:  .  budesonide-formoterol (SYMBICORT) 160-4.5 MCG/ACT inhaler, Inhale 2 puffs into the lungs 2 (two) times daily., Disp: , Rfl:  .  doxycycline (PERIOSTAT) 20 MG tablet, Take 20 mg by mouth 2 (two) times daily., Disp: , Rfl:  .  hydrALAZINE (APRESOLINE) 10 MG tablet, Take 10 mg by mouth 2 (two) times daily., Disp: , Rfl:  .  Lifitegrast (XIIDRA) 5 % SOLN, Apply 1 drop to eye 2 (two) times daily. Shirley Friar, Disp: , Rfl:  .  loteprednol (LOTEMAX) 0.5 % ophthalmic suspension, Place 1 drop into both eyes at bedtime., Disp: , Rfl:  .  montelukast (SINGULAIR) 10 MG tablet, Take 10 mg by mouth at bedtime., Disp: , Rfl:  .  Netarsudil Dimesylate (RHOPRESSA) 0.02 % SOLN, Place 1 drop into the right eye at bedtime., Disp: , Rfl:  .  acetaminophen (TYLENOL) 325 MG tablet, Take 2 tablets (650 mg total) by mouth every 6 (six) hours as needed for mild pain (or Fever >/= 101). (Patient not taking: Reported on 02/01/2019), Disp: 15 tablet, Rfl: 0 .  alendronate (FOSAMAX) 70 MG tablet, Take 70 mg by mouth once a week. Take with a full glass of water on an empty stomach., Disp: , Rfl:  .  bisacodyl (DULCOLAX) 5 MG EC tablet, Take 1 tablet (5 mg total) by mouth daily as needed for moderate constipation. (Patient not taking: Reported on 02/01/2019), Disp: 30 tablet,  Rfl: 0 .  budesonide-formoterol (SYMBICORT) 80-4.5 MCG/ACT inhaler, Take 2 puffs first thing in am and then another 2 puffs about 12 hours later. (Patient not taking: Reported on 03/10/2017), Disp: 1 Inhaler, Rfl: 12 .  calcium-vitamin D (SM CALCIUM 500/VITAMIN D3) 500-400 MG-UNIT tablet, Take 1 tablet by mouth at bedtime., Disp: , Rfl:  .  clotrimazole-betamethasone (LOTRISONE) cream, Apply to affected area 2 times daily (Patient not taking: Reported on 03/10/2017), Disp: 15 g, Rfl: 1 .  cyproheptadine (PERIACTIN) 4 MG tablet, Take 1 tablet (4 mg total) by mouth 3 (three) times daily as needed for allergies. (Patient not taking: Reported on 03/10/2017), Disp: 30 tablet, Rfl: 0 .  dorzolamide-timolol (COSOPT) 22.3-6.8 MG/ML ophthalmic solution, Place 1 drop into the right eye 2 (two) times daily. , Disp: , Rfl:  .  enoxaparin (LOVENOX) 40 MG/0.4ML injection, Inject 0.4 mLs (40 mg total) into the skin daily., Disp: 12 Syringe, Rfl: 0 .  esomeprazole (NEXIUM) 40 MG capsule, Take 40 mg by mouth daily., Disp: , Rfl:  .  ezetimibe (ZETIA) 10 MG tablet, Take 10 mg by mouth daily., Disp: , Rfl:  .  hydrALAZINE (APRESOLINE) 10 MG tablet, Take 1 tablet (10 mg total) by mouth 2 (two) times daily., Disp: 60 tablet, Rfl: 0 .  ibuprofen (ADVIL,MOTRIN) 400 MG tablet,  Take 1 tablet (400 mg total) by mouth every 6 (six) hours as needed for moderate pain., Disp: 30 tablet, Rfl: 0 .  latanoprost (XALATAN) 0.005 % ophthalmic solution, Place 1 drop into the right eye at bedtime. , Disp: , Rfl:  .  losartan (COZAAR) 50 MG tablet, Take 50 mg by mouth daily., Disp: , Rfl:  .  magnesium citrate SOLN, Take 296 mLs (1 Bottle total) by mouth once as needed for severe constipation., Disp: 195 mL, Rfl: 0 .  Multiple Vitamins-Minerals (PRESERVISION AREDS) TABS, Take 1 tablet by mouth 2 (two) times daily. , Disp: , Rfl:  .  Multiple Vitamins-Minerals (WOMENS 50+ MULTI VITAMIN/MIN) TABS, Take 1 tablet by mouth daily., Disp: , Rfl:  .   oxyCODONE (OXY IR/ROXICODONE) 5 MG immediate release tablet, Take 1 tablet (5 mg total) by mouth every 6 (six) hours as needed for severe pain or breakthrough pain ((for MODERATE breakthrough pain))., Disp: 20 tablet, Rfl: 0 .  senna-docusate (SENOKOT-S) 8.6-50 MG tablet, Take 1 tablet by mouth at bedtime as needed for mild constipation., Disp: 15 tablet, Rfl: 0  No medications prior to admission.     Allergies  Allergen Reactions  . Apraclonidine Rash  . Cheese Other (See Comments)    Sinus congestion  . Chocolate Flavor Other (See Comments)    Sinus congestion  . Lac Bovis Other (See Comments)    Sinus congestion   . Brimonidine Rash  . Clarithromycin Rash  . Clindamycin/Lincomycin Swelling and Rash  . Levofloxacin Rash  . Nickel Rash  . Penicillins Rash  . Polytrim [Polymyxin B-Trimethoprim] Rash  . Statins Rash  . Sulfamethoxazole Rash  . Sulfonamide Derivatives Rash  . Tobramycin Rash     Past Medical History:  Diagnosis Date  . COPD (chronic obstructive pulmonary disease) ([place])   . GERD (gastroesophageal reflux disease)   . Glaucoma   . Heart murmur   . Hyperlipidemia   . Hypertension   . Macular degeneration   . OSA (obstructive sleep apnea)    not on CPAP   . Osteoporosis     Review of systems:  Otherwise negative.    Physical Exam  Gen: Alert, oriented. Appears stated age.  HEENT: [place]/AT. PERRLA. Lungs: CTA, no wheezes. CV: RR nl S1, S2. Abd: soft, benign, no masses. BS+ Ext: No edema. Pulses 2+    Planned procedures: Proceed with colonoscopy. The patient understands the nature of the planned procedure, indications, risks, alternatives and potential complications including but not limited to bleeding, infection, perforation, damage to internal organs and possible oversedation/side effects from anesthesia. The patient agrees and gives consent to proceed.  Please refer to procedure notes for findings, recommendations and patient  disposition/instructions.     Ranon Coven K. Alice Reichert, M.D. Gastroenterology 07/14/2019  11:47 AM

## 2019-07-15 ENCOUNTER — Encounter: Payer: Self-pay | Admitting: Internal Medicine

## 2019-07-16 LAB — SURGICAL PATHOLOGY

## 2019-12-06 ENCOUNTER — Ambulatory Visit: Payer: Medicare PPO | Attending: Internal Medicine

## 2019-12-06 DIAGNOSIS — Z20822 Contact with and (suspected) exposure to covid-19: Secondary | ICD-10-CM

## 2019-12-07 LAB — NOVEL CORONAVIRUS, NAA: SARS-CoV-2, NAA: DETECTED — AB

## 2019-12-08 ENCOUNTER — Telehealth: Payer: Self-pay | Admitting: Adult Health

## 2019-12-08 NOTE — Telephone Encounter (Signed)
Called to discuss with Blima Ledger about Covid symptoms and the use of bamlanivimab, a monoclonal antibody infusion for those with mild to moderate Covid symptoms and at a high risk of hospitalization.     Pt is qualified for this infusion at the [facility] infusion center due to co-morbid conditions and/or a member of an at-risk group, however declines infusion at this time. Symptoms tier reviewed as well as criteria for ending isolation.  Symptoms reviewed that would warrant ED/Hospital evaluation. Preventative practices reviewed. Patient verbalized understanding. Patient advised to call back if he decides that he does want to get infusion. Callback number to the infusion center hotline given. Patient advised to go to Urgent care or ED with severe symptoms. Last date she would be eligible for infusion is 12/16/19     Patient Active Problem List   Diagnosis Date Noted  . Closed right hip fracture ([place]) 03/10/2017  . Cough variant asthma 01/25/2015  . GASTROESOPHAGEAL REFLUX DISEASE 04/27/2009  . DYSPNEA 03/20/2009  . HYPERTENSION 01/26/2009  . COUGH, CHRONIC 01/26/2009     Malary Aylesworth NP-C  [facility]    12/08/2019

## 2019-12-09 ENCOUNTER — Telehealth: Payer: Self-pay | Admitting: Nurse Practitioner

## 2019-12-09 ENCOUNTER — Other Ambulatory Visit: Payer: Self-pay | Admitting: Nurse Practitioner

## 2019-12-09 DIAGNOSIS — I10 Essential (primary) hypertension: Secondary | ICD-10-CM

## 2019-12-09 DIAGNOSIS — U071 COVID-19: Secondary | ICD-10-CM

## 2019-12-09 NOTE — Progress Notes (Signed)
  I connected by phone with Sonya Gaines on 12/09/2019 at 12:20 PM to discuss the potential use of an new treatment for mild to moderate COVID-19 viral infection in non-hospitalized patients.  This patient is a 74 y.o. female that meets the FDA criteria for Emergency Use Authorization of bamlanivimab or casirivimab\imdevimab.  Has a (+) direct SARS-CoV-2 viral test result  Has mild or moderate COVID-19   Is ? 74 years of age and weighs ? 40 kg  Is NOT hospitalized due to COVID-19  Is NOT requiring oxygen therapy or requiring an increase in baseline oxygen flow rate due to COVID-19  Is within 10 days of symptom onset  Has at least one of the high risk factor(s) for progression to severe COVID-19 and/or hospitalization as defined in EUA.  Specific high risk criteria : >/= 74 yo and hypertension.   I have spoken and communicated the following to the patient or parent/caregiver:  1. FDA has authorized the emergency use of bamlanivimab and casirivimab\imdevimab for the treatment of mild to moderate COVID-19 in adults and pediatric patients with positive results of direct SARS-CoV-2 viral testing who are 76 years of age and older weighing at least 40 kg, and who are at high risk for progressing to severe COVID-19 and/or hospitalization.  2. The significant known and potential risks and benefits of bamlanivimab and casirivimab\imdevimab, and the extent to which such potential risks and benefits are unknown.  3. Information on available alternative treatments and the risks and benefits of those alternatives, including clinical trials.  4. Patients treated with bamlanivimab and casirivimab\imdevimab should continue to self-isolate and use infection control measures (e.g., wear mask, isolate, social distance, avoid sharing personal items, clean and disinfect "high touch" surfaces, and frequent handwashing) according to CDC guidelines.   5. The patient or parent/caregiver has the option to  accept or refuse bamlanivimab or casirivimab\imdevimab .  After reviewing this information with the patient, The patient agreed to proceed with receiving the bamlanimivab infusion and will be provided a copy of the Fact sheet prior to receiving the infusion.   Sonya Gaines 12/09/2019 12:20 PM

## 2019-12-09 NOTE — Telephone Encounter (Signed)
Patient returned called expressing interest in infusion due to worsening symptoms and time to research medication. She is experiencing body aches, cough, shortness of breath.   At patient's request she has been scheduled for infusion on 12/11/19 @ 10:30 am. She verbalized awareness of appointment details.

## 2019-12-11 ENCOUNTER — Ambulatory Visit (HOSPITAL_COMMUNITY)
Admission: RE | Admit: 2019-12-11 | Discharge: 2019-12-11 | Disposition: A | Discharge status: Home / Self Care | Payer: Medicare Other | Source: Ambulatory Visit | Attending: Pulmonary Disease | Admitting: Pulmonary Disease

## 2019-12-11 DIAGNOSIS — Z23 Encounter for immunization: Secondary | ICD-10-CM | POA: Insufficient documentation

## 2019-12-11 DIAGNOSIS — I10 Essential (primary) hypertension: Secondary | ICD-10-CM | POA: Insufficient documentation

## 2019-12-11 DIAGNOSIS — U071 COVID-19: Secondary | ICD-10-CM | POA: Diagnosis present

## 2019-12-11 MED ORDER — EPINEPHRINE 0.3 MG/0.3ML IJ SOAJ: 0.3000 mg | Freq: Once | INTRAMUSCULAR | Status: DC | PRN

## 2019-12-11 MED ORDER — METHYLPREDNISOLONE SODIUM SUCC 125 MG IJ SOLR: 125.0000 mg | Freq: Once | INTRAMUSCULAR | Status: DC | PRN

## 2019-12-11 MED ORDER — FAMOTIDINE IN NACL 20-0.9 MG/50ML-% IV SOLN: 20.0000 mg | Freq: Once | INTRAVENOUS | Status: DC | PRN

## 2019-12-11 MED ORDER — SODIUM CHLORIDE 0.9 % IV SOLN
INTRAVENOUS | Status: DC | PRN
  Administered 2019-12-11: 10:00:00 250 mL via INTRAVENOUS

## 2019-12-11 MED ORDER — ALBUTEROL SULFATE HFA 108 (90 BASE) MCG/ACT IN AERS: 2.0000 | INHALATION_SPRAY | Freq: Once | RESPIRATORY_TRACT | Status: DC | PRN

## 2019-12-11 MED ORDER — DIPHENHYDRAMINE HCL 50 MG/ML IJ SOLN: 50.0000 mg | Freq: Once | INTRAMUSCULAR | Status: DC | PRN

## 2019-12-11 MED ORDER — SODIUM CHLORIDE 0.9 % IV SOLN
700.0000 mg | Freq: Once | INTRAVENOUS | Status: AC
  Administered 2019-12-11: 700 mg via INTRAVENOUS
  Filled 2019-12-11: qty 20

## 2019-12-11 NOTE — Discharge Instructions (Signed)

## 2019-12-11 NOTE — Progress Notes (Signed)
  Diagnosis: COVID-19  Physician: Dr. Joya Gaskins  Procedure: [facility] Med: bamlanivimab infusion - Provided patient with bamlanimivab fact sheet for patients, parents and caregivers prior to infusion.  Complications: No immediate complications noted.  Discharge: Discharged home   Sonya Gaines 12/11/2019

## 2020-05-07 ENCOUNTER — Emergency Department: Payer: Medicare PPO

## 2020-05-07 ENCOUNTER — Encounter: Payer: Self-pay | Admitting: Emergency Medicine

## 2020-05-07 ENCOUNTER — Other Ambulatory Visit: Payer: Self-pay

## 2020-05-07 ENCOUNTER — Emergency Department
Admission: EM | Admit: 2020-05-07 | Discharge: 2020-05-07 | Disposition: A | Discharge status: Home / Self Care | Payer: Medicare PPO | Attending: Emergency Medicine | Admitting: Emergency Medicine

## 2020-05-07 DIAGNOSIS — I10 Essential (primary) hypertension: Secondary | ICD-10-CM | POA: Diagnosis not present

## 2020-05-07 DIAGNOSIS — J449 Chronic obstructive pulmonary disease, unspecified: Secondary | ICD-10-CM | POA: Insufficient documentation

## 2020-05-07 DIAGNOSIS — Y999 Unspecified external cause status: Secondary | ICD-10-CM | POA: Insufficient documentation

## 2020-05-07 DIAGNOSIS — W010XXA Fall on same level from slipping, tripping and stumbling without subsequent striking against object, initial encounter: Secondary | ICD-10-CM | POA: Diagnosis not present

## 2020-05-07 DIAGNOSIS — R519 Headache, unspecified: Secondary | ICD-10-CM | POA: Insufficient documentation

## 2020-05-07 DIAGNOSIS — S52531A Colles' fracture of right radius, initial encounter for closed fracture: Secondary | ICD-10-CM | POA: Insufficient documentation

## 2020-05-07 DIAGNOSIS — W19XXXA Unspecified fall, initial encounter: Secondary | ICD-10-CM

## 2020-05-07 DIAGNOSIS — S6991XA Unspecified injury of right wrist, hand and finger(s), initial encounter: Secondary | ICD-10-CM | POA: Diagnosis present

## 2020-05-07 DIAGNOSIS — Y92007 Garden or yard of unspecified non-institutional (private) residence as the place of occurrence of the external cause: Secondary | ICD-10-CM | POA: Diagnosis not present

## 2020-05-07 DIAGNOSIS — Y93H2 Activity, gardening and landscaping: Secondary | ICD-10-CM | POA: Diagnosis not present

## 2020-05-07 DIAGNOSIS — Z79899 Other long term (current) drug therapy: Secondary | ICD-10-CM | POA: Diagnosis not present

## 2020-05-07 DIAGNOSIS — Z87891 Personal history of nicotine dependence: Secondary | ICD-10-CM | POA: Diagnosis not present

## 2020-05-07 DIAGNOSIS — R52 Pain, unspecified: Secondary | ICD-10-CM

## 2020-05-07 LAB — COMPREHENSIVE METABOLIC PANEL
ALT: 19 U/L (ref 0–44)
AST: 25 U/L (ref 15–41)
Albumin: 3.5 g/dL (ref 3.5–5.0)
Alkaline Phosphatase: 69 U/L (ref 38–126)
Anion gap: 6 (ref 5–15)
BUN: 16 mg/dL (ref 8–23)
CO2: 28 mmol/L (ref 22–32)
Calcium: 9.2 mg/dL (ref 8.9–10.3)
Chloride: 100 mmol/L (ref 98–111)
Creatinine, Ser: 0.71 mg/dL (ref 0.44–1.00)
GFR calc Af Amer: >60 mL/min (ref >60)
GFR calc non Af Amer: >60 mL/min (ref >60)
Glucose, Bld: 130 mg/dL — ABNORMAL HIGH (ref 70–99)
Potassium: 4 mmol/L (ref 3.5–5.1)
Sodium: 134 mmol/L — ABNORMAL LOW (ref 135–145)
Total Bilirubin: 0.8 mg/dL (ref 0.3–1.2)
Total Protein: 6.3 g/dL — ABNORMAL LOW (ref 6.5–8.1)

## 2020-05-07 LAB — CBC
HCT: 36.8 % (ref 36.0–46.0)
Hemoglobin: 12.5 g/dL (ref 12.0–15.0)
MCH: 28 pg (ref 26.0–34.0)
MCHC: 34 g/dL (ref 30.0–36.0)
MCV: 82.5 fL (ref 80.0–100.0)
Platelets: 210 10*3/uL (ref 150–400)
RBC: 4.46 MIL/uL (ref 3.87–5.11)
RDW: 14.1 % (ref 11.5–15.5)
WBC: 6.1 10*3/uL (ref 4.0–10.5)
nRBC: 0 % (ref 0.0–0.2)

## 2020-05-07 MED ORDER — MORPHINE SULFATE (PF) 4 MG/ML IV SOLN
4.0000 mg | Freq: Once | INTRAVENOUS | Status: AC
  Administered 2020-05-07: 4 mg via INTRAVENOUS
  Filled 2020-05-07: qty 1

## 2020-05-07 MED ORDER — LIDOCAINE HCL 1 % IJ SOLN
30.0000 mL | Freq: Once | INTRAMUSCULAR | Status: AC
  Administered 2020-05-07: 30 mL
  Filled 2020-05-07: qty 30

## 2020-05-07 MED ORDER — LACTATED RINGERS IV BOLUS
1000.0000 mL | Freq: Once | INTRAVENOUS | Status: AC
  Administered 2020-05-07: 1000 mL via INTRAVENOUS

## 2020-05-07 MED ORDER — SODIUM CHLORIDE 0.9 % IV BOLUS: 1000.0000 mL | Freq: Once | INTRAVENOUS | Status: DC

## 2020-05-07 MED ORDER — OXYCODONE-ACETAMINOPHEN 5-325 MG PO TABS: 1.0000 | ORAL_TABLET | ORAL | 0 refills | Status: AC | PRN

## 2020-05-07 NOTE — ED Provider Notes (Signed)
[facility] Emergency Department Provider Note   ____________________________________________   First MD Initiated Contact with Patient 05/07/20 0901     (approximate)  I have reviewed the triage vital signs and the nursing notes.   HISTORY  Chief Complaint Fall and Wrist Pain    HPI Sonya Gaines is a 74 y.o. female with past medical history of hypertension, hyperlipidemia, COPD who presents to the ED complaining of fall.  Patient reports that just prior to arrival she was attempting to pull up a weed in her garden when she lost her balance and fell backwards.  She struck her head but did not lose consciousness, now denies any headache, neck pain, numbness, or weakness.  She primarily complains of pain at her right wrist with obvious deformity.  She was able to walk back into her house after the fall and get a ride to the ED with family.  When she arrived, she was feeling very lightheaded and stated she was "going to faint from the pain".        Past Medical History:  Diagnosis Date  . Asthma    Slight  . COPD (chronic obstructive pulmonary disease) ([place])   . GERD (gastroesophageal reflux disease)   . Glaucoma   . Heart murmur   . Hyperlipidemia   . Hypertension   . Macular degeneration   . OSA (obstructive sleep apnea)    not on CPAP   . Osteoporosis     Patient Active Problem List   Diagnosis Date Noted  . Closed right hip fracture ([place]) 03/10/2017  . Cough variant asthma 01/25/2015  . GASTROESOPHAGEAL REFLUX DISEASE 04/27/2009  . DYSPNEA 03/20/2009  . HYPERTENSION 01/26/2009  . COUGH, CHRONIC 01/26/2009    Past Surgical History:  Procedure Laterality Date  . ABDOMINAL HYSTERECTOMY    . BREAST CYST ASPIRATION Right YRS AGO  . COLONOSCOPY WITH PROPOFOL N/A 07/14/2019   Procedure: COLONOSCOPY WITH PROPOFOL;  Surgeon: Toledo, Benay Pike, MD;  Location: [facility] ENDOSCOPY;  Service: Endoscopy;  Laterality: N/A;  . GLAUCOMA SURGERY     x 2    . HIP PINNING,CANNULATED Right 03/12/2017   Procedure: CANNULATED HIP PINNING;  Surgeon: Thornton Park, MD;  Location: [facility] ORS;  Service: Orthopedics;  Laterality: Right;  . NASAL SINUS SURGERY     x 3   . VESICOVAGINAL FISTULA CLOSURE W/ TAH      Prior to Admission medications   Medication Sig Start Date End Date Taking? Authorizing Provider  acetaminophen (TYLENOL) 325 MG tablet Take 2 tablets (650 mg total) by mouth every 6 (six) hours as needed for mild pain (or Fever >/= 101). Patient not taking: Reported on 02/01/2019 03/14/17   Vaughan Basta, MD  albuterol (VENTOLIN HFA) 108 (90 Base) MCG/ACT inhaler Inhale 2 puffs into the lungs every 6 (six) hours as needed for wheezing or shortness of breath.    [provider]  alendronate (FOSAMAX) 70 MG tablet Take 70 mg by mouth once a week. Take with a full glass of water on an empty stomach.    [provider]  bisacodyl (DULCOLAX) 5 MG EC tablet Take 1 tablet (5 mg total) by mouth daily as needed for moderate constipation. Patient not taking: Reported on 02/01/2019 03/14/17   Vaughan Basta, MD  budesonide-formoterol [facility]) 160-4.5 MCG/ACT inhaler Inhale 2 puffs into the lungs 2 (two) times daily.    [provider]  budesonide-formoterol (SYMBICORT) 80-4.5 MCG/ACT inhaler Take 2 puffs first thing in am and  then another 2 puffs about 12 hours later. Patient not taking: Reported on 03/10/2017 01/25/15   Tanda Rockers, MD  calcium-vitamin D (SM CALCIUM 500/VITAMIN D3) 500-400 MG-UNIT tablet Take 1 tablet by mouth at bedtime.    [provider]  clotrimazole-betamethasone (LOTRISONE) cream Apply to affected area 2 times daily Patient not taking: Reported on 03/10/2017 04/17/16   Sable Feil, PA-C  cyproheptadine (PERIACTIN) 4 MG tablet Take 1 tablet (4 mg total) by mouth 3 (three) times daily as needed for allergies. Patient not taking: Reported on 03/10/2017 04/17/16   Sable Feil, PA-C   dorzolamide-timolol (COSOPT) 22.3-6.8 MG/ML ophthalmic solution Place 1 drop into the right eye 2 (two) times daily.     [provider]  doxycycline (PERIOSTAT) 20 MG tablet Take 20 mg by mouth 2 (two) times daily.    [provider]  enoxaparin (LOVENOX) 40 MG/0.4ML injection Inject 0.4 mLs (40 mg total) into the skin daily. 03/14/17 03/26/17  Vaughan Basta, MD  esomeprazole (NEXIUM) 40 MG capsule Take 40 mg by mouth daily. 11/05/16   [provider]  ezetimibe (ZETIA) 10 MG tablet Take 10 mg by mouth daily.    [provider]  hydrALAZINE (APRESOLINE) 10 MG tablet Take 1 tablet (10 mg total) by mouth 2 (two) times daily. 02/01/19 02/01/20  Rudene Re, MD  hydrALAZINE (APRESOLINE) 10 MG tablet Take 10 mg by mouth 2 (two) times daily.    [provider]  ibuprofen (ADVIL,MOTRIN) 400 MG tablet Take 1 tablet (400 mg total) by mouth every 6 (six) hours as needed for moderate pain. Patient not taking: Reported on 07/14/2019 03/14/17   Vaughan Basta, MD  latanoprost (XALATAN) 0.005 % ophthalmic solution Place 1 drop into the right eye at bedtime.     [provider]  Lifitegrast Shirley Friar) 5 % SOLN Apply 1 drop to eye 2 (two) times daily. Shirley Friar    [provider]  losartan (COZAAR) 50 MG tablet Take 50 mg by mouth daily.    [provider]  loteprednol (LOTEMAX) 0.5 % ophthalmic suspension Place 1 drop into both eyes at bedtime.    [provider]  magnesium citrate SOLN Take 296 mLs (1 Bottle total) by mouth once as needed for severe constipation. 03/14/17   Vaughan Basta, MD  montelukast (SINGULAIR) 10 MG tablet Take 10 mg by mouth at bedtime.    [provider]  Multiple Vitamins-Minerals (PRESERVISION AREDS) TABS Take 1 tablet by mouth 2 (two) times daily.     [provider]  Multiple Vitamins-Minerals (WOMENS 50+ MULTI VITAMIN/MIN) TABS Take 1 tablet by mouth daily.     [provider]  Netarsudil Dimesylate (RHOPRESSA) 0.02 % SOLN Place 1 drop into the right eye at bedtime.    [provider]  oxyCODONE (OXY IR/ROXICODONE) 5 MG immediate release tablet Take 1 tablet (5 mg total) by mouth every 6 (six) hours as needed for severe pain or breakthrough pain ((for MODERATE breakthrough pain)). 03/14/17   Vaughan Basta, MD  oxyCODONE-acetaminophen (PERCOCET) 5-325 MG tablet Take 1 tablet by mouth every 4 (four) hours as needed for severe pain. 05/07/20 05/07/21  Blake Divine, MD  senna-docusate (SENOKOT-S) 8.6-50 MG tablet Take 1 tablet by mouth at bedtime as needed for mild constipation. 03/14/17   Vaughan Basta, MD    Allergies Apraclonidine, Cheese, Chocolate flavor, Lac bovis, Brimonidine, Clarithromycin, Clindamycin/lincomycin, Levofloxacin, Nickel, Penicillins, Polytrim [polymyxin b-trimethoprim], Statins, Sulfamethoxazole, Sulfonamide derivatives, and Tobramycin  Family History  Problem Relation  Age of Onset  . Emphysema Father        smoked  . Heart disease Father   . Heart disease Brother   . Breast cancer Maternal Aunt   . Lung cancer Paternal Grandfather        never smoker  . Lung cancer Son        never smoker    Social History Social History   Tobacco Use  . Smoking status: Former Smoker    Packs/day: 1.50    Years: 30.00    Pack years: 45.00    Types: Cigarettes    Quit date: 11/19/1991    Years since quitting: 28.4  . Smokeless tobacco: Never Used  Substance Use Topics  . Alcohol use: No    Alcohol/week: 0.0 standard drinks  . Drug use: No    Review of Systems  Constitutional: No fever/chills.  Positive for lightheadedness. Eyes: No visual changes. ENT: No sore throat. Cardiovascular: Denies chest pain. Respiratory: Denies shortness of breath. Gastrointestinal: No abdominal pain.  No nausea, no vomiting.  No diarrhea.  No constipation. Genitourinary: Negative for dysuria. Musculoskeletal:  Negative for back pain.  Positive for right wrist pain. Skin: Negative for rash. Neurological: Negative for headaches, focal weakness or numbness.  ____________________________________________   PHYSICAL EXAM:  VITAL SIGNS: ED Triage Vitals  Enc Vitals Group     BP 05/07/20 0856 (!) 75/43     Pulse Rate 05/07/20 0856 (!) 44     Resp 05/07/20 0856 18     Temp 05/07/20 0856 97.6 F (36.4 C)     Temp Source 05/07/20 0856 Oral     SpO2 05/07/20 0856 100 %     Weight 05/07/20 0852 115 lb (52.2 kg)     Height 05/07/20 0852 5\' 1"  (1.549 m)     Head Circumference --      Peak Flow --      Pain Score 05/07/20 0852 7     Pain Loc --      Pain Edu? --      Excl. in [place]? --     Constitutional: Alert and oriented. Eyes: Conjunctivae are normal. Head: Atraumatic. Nose: No congestion/rhinnorhea. Mouth/Throat: Mucous membranes are moist. Neck: Normal ROM, no midline cervical spine tenderness. Cardiovascular: Normal rate, regular rhythm. Grossly normal heart sounds.  2+ radial pulses bilaterally. Respiratory: Normal respiratory effort.  No retractions. Lungs CTAB. Gastrointestinal: Soft and nontender. No distention. Genitourinary: deferred Musculoskeletal: No lower extremity tenderness nor edema.  Obvious deformity to right distal wrist with dorsal angulation.  Otherwise no tenderness along bilateral upper and lower extremities.  Able to move right digits. Neurologic:  Normal speech and language. No gross focal neurologic deficits are appreciated. Skin:  Skin is warm, dry and intact. No rash noted. Psychiatric: Mood and affect are normal. Speech and behavior are normal.  ____________________________________________   LABS (all labs ordered are listed, but only abnormal results are displayed)  Labs Reviewed  COMPREHENSIVE METABOLIC PANEL - Abnormal; Notable for the following components:      Result Value   Sodium 134 (*)    Glucose, Bld 130 (*)    Total Protein 6.3 (*)    All  other components within normal limits  CBC   ____________________________________________  EKG  ED ECG REPORT I, Blake Divine, the attending physician, personally viewed and interpreted this ECG.   Date: 05/07/2020  EKG Time: 9:05  Rate: 58  Rhythm: sinus bradycardia  Axis: Normal  Intervals:none  ST&T Change: None  PROCEDURES  Procedure(s) performed (including Critical Care):  .Ortho Injury Treatment  Date/Time: 05/07/2020 12:50 PM Performed by: Blake Divine, MD Authorized by: Blake Divine, MD   Consent:    Consent obtained:  Verbal   Consent given by:  Patient   Risks discussed:  Nerve damage, restricted joint movement, irreducible dislocation, vascular damage and fractureInjury location: wrist Location details: right wrist Injury type: fracture Fracture type: distal radius Pre-procedure neurovascular assessment: neurovascularly intact Pre-procedure distal perfusion: normal Pre-procedure neurological function: normal Pre-procedure range of motion: reduced Anesthesia: hematoma block  Anesthesia: Local anesthesia used: yes Local Anesthetic: lidocaine 1% without epinephrine Anesthetic total: 5 mL  Patient sedated: NoManipulation performed: yes Skin traction used: yes Skeletal traction used: yes Reduction successful: yes X-ray confirmed reduction: yes Immobilization: splint Splint type: sugar tong Supplies used: Ortho-Glass Post-procedure neurovascular assessment: post-procedure neurovascularly intact Post-procedure distal perfusion: normal Post-procedure neurological function: normal Post-procedure range of motion: improved Patient tolerance: patient tolerated the procedure well with no immediate complications      ____________________________________________   INITIAL IMPRESSION / ASSESSMENT AND PLAN / ED COURSE       74 year old female with history of hypertension, hyperlipidemia, and COPD presents to the ED complaining of a fall  backwards while she was attempting to pull up a weed.  She hit her head but did not lose consciousness, now primarily complains of right wrist pain with obvious deformity.  We will check CT head, C-spine, and x-rays of right wrist.  She is neurovascularly intact to her right hand and there are do not appear to be any other bony injuries.  She was initially hypotensive on arrival to the ED, but this rapidly improved without intervention.  EKG shows no evidence of arrhythmia or ischemia, however she does have some mild sinus bradycardia.  We will hydrate with IV fluids, check basic labs, and treat pain with IV morphine.  Anticipate she will require reduction of distal radius fracture.  CT head and C-spine are negative for acute process, x-rays of right wrist show dorsally angulated and displaced distal radius fracture.  Lab work unremarkable and patient's blood pressure remains improved.  Hematoma block performed on distal radius fracture with improved pain control, fracture was then reduced without difficulty.  Follow-up x-ray shows improved positioning and patient was placed in sugar tong splint.  Case discussed with Dr. Mack Guise of orthopedics, who agrees with plan for outpatient follow-up later this week.  Patient is appropriate for discharge home, counseled to return to the ED for new or worsening symptoms.  Patient agrees with plan.      ____________________________________________   FINAL CLINICAL IMPRESSION(S) / ED DIAGNOSES  Final diagnoses:  Fall, initial encounter  Closed Colles' fracture of right radius, initial encounter     ED Discharge Orders         Ordered    oxyCODONE-acetaminophen (PERCOCET) 5-325 MG tablet  Every 4 hours PRN     Discontinue  Reprint     05/07/20 1249           Note:  This document was prepared using Dragon voice recognition software and may include unintentional dictation errors.   Blake Divine, MD 05/07/20 1252

## 2020-05-07 NOTE — ED Notes (Signed)
First Nurse Note: Pt to ED via POV for fall with right wrist pain. Pt hit her head when she fell. Pt is not on blood thinners. Pt states that she feels like she is "going to faint from the pain." Pt is in NAD.

## 2020-05-07 NOTE — ED Notes (Signed)
While in triage pt was noted to be pale and diaphoretic. VS were very low. Informed first RN and bed obtained. Taken back to room, placed on stretcher lying flat, VS obtained again. EKG performed. Pt then taken to scans once VS were stable.

## 2020-05-07 NOTE — ED Triage Notes (Signed)
Pt to ED via POV s/p fall with right wrist pain. Pt states that she was pulling a weed out of the ground and fell backwards hitting her head. Pt states that she is not on blood thinners. Pt has deformity to her right wrist and is diaphoretic at this time.

## 2020-06-23 ENCOUNTER — Other Ambulatory Visit: Payer: Self-pay | Admitting: Internal Medicine

## 2020-06-23 DIAGNOSIS — F3340 Major depressive disorder, recurrent, in remission, unspecified: Secondary | ICD-10-CM | POA: Insufficient documentation

## 2020-06-23 DIAGNOSIS — Z1231 Encounter for screening mammogram for malignant neoplasm of breast: Secondary | ICD-10-CM

## 2020-07-21 ENCOUNTER — Ambulatory Visit
Admission: RE | Admit: 2020-07-21 | Discharge: 2020-07-21 | Disposition: A | Discharge status: Home / Self Care | Payer: Medicare PPO | Source: Ambulatory Visit | Attending: Internal Medicine | Admitting: Internal Medicine

## 2020-07-21 DIAGNOSIS — Z1231 Encounter for screening mammogram for malignant neoplasm of breast: Secondary | ICD-10-CM

## 2021-06-07 ENCOUNTER — Emergency Department: Payer: Medicare PPO

## 2021-06-07 ENCOUNTER — Other Ambulatory Visit: Payer: Self-pay

## 2021-06-07 ENCOUNTER — Emergency Department
Admission: EM | Admit: 2021-06-07 | Discharge: 2021-06-07 | Disposition: A | Discharge status: Home / Self Care | Payer: Medicare PPO | Attending: Emergency Medicine | Admitting: Emergency Medicine

## 2021-06-07 DIAGNOSIS — Z7951 Long term (current) use of inhaled steroids: Secondary | ICD-10-CM | POA: Insufficient documentation

## 2021-06-07 DIAGNOSIS — J45909 Unspecified asthma, uncomplicated: Secondary | ICD-10-CM | POA: Insufficient documentation

## 2021-06-07 DIAGNOSIS — R1011 Right upper quadrant pain: Secondary | ICD-10-CM | POA: Diagnosis not present

## 2021-06-07 DIAGNOSIS — J449 Chronic obstructive pulmonary disease, unspecified: Secondary | ICD-10-CM | POA: Diagnosis not present

## 2021-06-07 DIAGNOSIS — M549 Dorsalgia, unspecified: Secondary | ICD-10-CM | POA: Diagnosis not present

## 2021-06-07 DIAGNOSIS — R0789 Other chest pain: Secondary | ICD-10-CM | POA: Diagnosis present

## 2021-06-07 DIAGNOSIS — Z79899 Other long term (current) drug therapy: Secondary | ICD-10-CM | POA: Insufficient documentation

## 2021-06-07 DIAGNOSIS — I10 Essential (primary) hypertension: Secondary | ICD-10-CM | POA: Insufficient documentation

## 2021-06-07 DIAGNOSIS — Z87891 Personal history of nicotine dependence: Secondary | ICD-10-CM | POA: Diagnosis not present

## 2021-06-07 LAB — BASIC METABOLIC PANEL
Anion gap: 12 (ref 5–15)
BUN: 14 mg/dL (ref 8–23)
CO2: 26 mmol/L (ref 22–32)
Calcium: 9.8 mg/dL (ref 8.9–10.3)
Chloride: 102 mmol/L (ref 98–111)
Creatinine, Ser: 0.66 mg/dL (ref 0.44–1.00)
GFR, Estimated: >60 mL/min (ref >60)
Glucose, Bld: 97 mg/dL (ref 70–99)
Potassium: 4.3 mmol/L (ref 3.5–5.1)
Sodium: 140 mmol/L (ref 135–145)

## 2021-06-07 LAB — HEPATIC FUNCTION PANEL
ALT: 17 U/L (ref 0–44)
AST: 19 U/L (ref 15–41)
Albumin: 3.9 g/dL (ref 3.5–5.0)
Alkaline Phosphatase: 92 U/L (ref 38–126)
Bilirubin, Direct: <0.1 mg/dL (ref 0.0–0.2)
Total Bilirubin: 0.6 mg/dL (ref 0.3–1.2)
Total Protein: 7.2 g/dL (ref 6.5–8.1)

## 2021-06-07 LAB — CBC
HCT: 41.9 % (ref 36.0–46.0)
Hemoglobin: 14 g/dL (ref 12.0–15.0)
MCH: 28.2 pg (ref 26.0–34.0)
MCHC: 33.4 g/dL (ref 30.0–36.0)
MCV: 84.5 fL (ref 80.0–100.0)
Platelets: 259 10*3/uL (ref 150–400)
RBC: 4.96 MIL/uL (ref 3.87–5.11)
RDW: 13.5 % (ref 11.5–15.5)
WBC: 8.3 10*3/uL (ref 4.0–10.5)
nRBC: 0 % (ref 0.0–0.2)

## 2021-06-07 LAB — TROPONIN I (HIGH SENSITIVITY)
Troponin I (High Sensitivity): 3 ng/L (ref <18)
Troponin I (High Sensitivity): 3 ng/L (ref <18)

## 2021-06-07 LAB — LIPASE, BLOOD: Lipase: 37 U/L (ref 11–51)

## 2021-06-07 MED ORDER — IOHEXOL 300 MG/ML  SOLN
75.0000 mL | Freq: Once | INTRAMUSCULAR | Status: AC | PRN
  Administered 2021-06-07: 75 mL via INTRAVENOUS

## 2021-06-07 MED ORDER — IPRATROPIUM-ALBUTEROL 0.5-2.5 (3) MG/3ML IN SOLN
3.0000 mL | Freq: Once | RESPIRATORY_TRACT | Status: AC
  Administered 2021-06-07: 3 mL via RESPIRATORY_TRACT
  Filled 2021-06-07: qty 3

## 2021-06-07 MED ORDER — CLONIDINE HCL 0.1 MG PO TABS
0.2000 mg | ORAL_TABLET | Freq: Once | ORAL | Status: AC
  Administered 2021-06-07: 0.2 mg via ORAL
  Filled 2021-06-07: qty 2

## 2021-06-07 MED ORDER — PREDNISONE 10 MG (21) PO TBPK: ORAL_TABLET | ORAL | 0 refills | Status: DC

## 2021-06-07 NOTE — ED Triage Notes (Signed)
Pt to ED POV for chest discomfort radiating to back that she woke up with yesterday. Reports waking up yesterday am feeling like somebody had beat her.  Denies shob, N/V Hx HTN

## 2021-06-07 NOTE — Discharge Instructions (Addendum)
Start using your symbicort in addition to albuterol Follow up with dr Ginette Pitman if your blood pressure remains high Return to the ER if your symptoms are worsening

## 2021-06-07 NOTE — ED Provider Notes (Signed)
[facility] Emergency Department Provider Note  ____________________________________________   Event Date/Time   First MD Initiated Contact with Patient 06/07/21 1127     (approximate)  I have reviewed the triage vital signs and the nursing notes.   HISTORY  Chief Complaint Chest Pain    HPI Sonya Gaines is a 75 y.o. female presents emergency department complaining of right-sided back pain, centralized chest discomfort, sensation of feeling very full after eating small amounts for 2 days.,  Complains of dark stools however she does take iron, denies fever, chills, vomiting/diarrhea.  No swelling of the lower extremities.  Past Medical History:  Diagnosis Date   Asthma    Slight   COPD (chronic obstructive pulmonary disease) (HCC)    GERD (gastroesophageal reflux disease)    Glaucoma    Heart murmur    Hyperlipidemia    Hypertension    Macular degeneration    OSA (obstructive sleep apnea)    not on CPAP    Osteoporosis     Patient Active Problem List   Diagnosis Date Noted   Closed right hip fracture ([place]) 03/10/2017   Cough variant asthma 01/25/2015   GASTROESOPHAGEAL REFLUX DISEASE 04/27/2009   DYSPNEA 03/20/2009   HYPERTENSION 01/26/2009   COUGH, CHRONIC 01/26/2009    Past Surgical History:  Procedure Laterality Date   ABDOMINAL HYSTERECTOMY     BREAST CYST ASPIRATION Right YRS AGO   COLONOSCOPY WITH PROPOFOL N/A 07/14/2019   Procedure: COLONOSCOPY WITH PROPOFOL;  Surgeon: Tol[place], Benay Pike, MD;  Location: [facility] ENDOSCOPY;  Service: Endoscopy;  Laterality: N/A;   GLAUCOMA SURGERY     x 2    HIP PINNING,CANNULATED Right 03/12/2017   Procedure: CANNULATED HIP PINNING;  Surgeon: Thornton Park, MD;  Location: [facility] ORS;  Service: Orthopedics;  Laterality: Right;   NASAL SINUS SURGERY     x 3    VESICOVAGINAL FISTULA CLOSURE W/ TAH      Prior to Admission medications   Medication Sig Start Date End Date Taking? Authorizing  Provider  predniSONE (STERAPRED UNI-PAK 21 TAB) 10 MG (21) TBPK tablet Take 6 pills on day one then decrease by 1 pill each day 06/07/21  Yes Perl Folmar, Linden Dolin, PA-C  albuterol (VENTOLIN HFA) 108 (90 Base) MCG/ACT inhaler Inhale 2 puffs into the lungs every 6 (six) hours as needed for wheezing or shortness of breath.    [provider]  alendronate (FOSAMAX) 70 MG tablet Take 70 mg by mouth once a week. Take with a full glass of water on an empty stomach.    [provider]  budesonide-formoterol (SYMBICORT) 160-4.5 MCG/ACT inhaler Inhale 2 puffs into the lungs 2 (two) times daily.    [provider]  calcium-vitamin D (SM CALCIUM 500/VITAMIN D3) 500-400 MG-UNIT tablet Take 1 tablet by mouth at bedtime.    [provider]  dorzolamide-timolol (COSOPT) 22.3-6.8 MG/ML ophthalmic solution Place 1 drop into the right eye 2 (two) times daily.     [provider]  doxycycline (PERIOSTAT) 20 MG tablet Take 20 mg by mouth 2 (two) times daily.    [provider]  enoxaparin (LOVENOX) 40 MG/0.4ML injection Inject 0.4 mLs (40 mg total) into the skin daily. 03/14/17 03/26/17  Vaughan Basta, MD  esomeprazole (NEXIUM) 40 MG capsule Take 40 mg by mouth daily. 11/05/16   [provider]  ezetimibe (ZETIA) 10 MG tablet Take 10 mg by mouth daily.    [provider]  hydrALAZINE (APRESOLINE) 10 MG  tablet Take 1 tablet (10 mg total) by mouth 2 (two) times daily. 02/01/19 02/01/20  Rudene Re, MD  hydrALAZINE (APRESOLINE) 10 MG tablet Take 10 mg by mouth 2 (two) times daily.    [provider]  latanoprost (XALATAN) 0.005 % ophthalmic solution Place 1 drop into the right eye at bedtime.     [provider]  Lifitegrast Shirley Friar) 5 % SOLN Apply 1 drop to eye 2 (two) times daily. Shirley Friar    [provider]  losartan (COZAAR) 50 MG tablet Take 50 mg by mouth daily.    [provider]  loteprednol (LOTEMAX) 0.5  % ophthalmic suspension Place 1 drop into both eyes at bedtime.    [provider]  magnesium citrate SOLN Take 296 mLs (1 Bottle total) by mouth once as needed for severe constipation. 03/14/17   Vaughan Basta, MD  montelukast (SINGULAIR) 10 MG tablet Take 10 mg by mouth at bedtime.    [provider]  Multiple Vitamins-Minerals (PRESERVISION AREDS) TABS Take 1 tablet by mouth 2 (two) times daily.     [provider]  Multiple Vitamins-Minerals (WOMENS 50+ MULTI VITAMIN/MIN) TABS Take 1 tablet by mouth daily.    [provider]  Netarsudil Dimesylate (RHOPRESSA) 0.02 % SOLN Place 1 drop into the right eye at bedtime.    [provider]  oxyCODONE (OXY IR/ROXICODONE) 5 MG immediate release tablet Take 1 tablet (5 mg total) by mouth every 6 (six) hours as needed for severe pain or breakthrough pain ((for MODERATE breakthrough pain)). 03/14/17   Vaughan Basta, MD  senna-docusate (SENOKOT-S) 8.6-50 MG tablet Take 1 tablet by mouth at bedtime as needed for mild constipation. 03/14/17   Vaughan Basta, MD    Allergies Apraclonidine, Cheese, Chocolate flavor, Lac bovis, Brimonidine, Clarithromycin, Clindamycin/lincomycin, Levofloxacin, Nickel, Penicillins, Polytrim [polymyxin b-trimethoprim], Statins, Sulfamethoxazole, Sulfonamide derivatives, and Tobramycin  Family History  Problem Relation Age of Onset   Emphysema Father        smoked   Heart disease Father    Heart disease Brother    Breast cancer Maternal Aunt    Lung cancer Paternal Grandfather        never smoker   Lung cancer Son        never smoker    Social History Social History   Tobacco Use   Smoking status: Former    Packs/day: 1.50    Years: 30.00    Pack years: 45.00    Types: Cigarettes    Quit date: 11/19/1991    Years since quitting: 29.5   Smokeless tobacco: Never  Substance Use Topics   Alcohol use: No    Alcohol/week: 0.0 standard drinks   Drug  use: No    Review of Systems  Constitutional: No fever/chills Eyes: No visual changes. ENT: No sore throat. Respiratory: Denies cough Cardiovascular: Positive chest pain Gastrointestinal: Positive abdominal pain Genitourinary: Negative for dysuria. Musculoskeletal: Negative for back pain. Skin: Negative for rash. Psychiatric: no mood changes,     ____________________________________________   PHYSICAL EXAM:  VITAL SIGNS: ED Triage Vitals [06/07/21 1043]  Enc Vitals Group     BP (!) 191/103     Pulse Rate 68     Resp 18     Temp 98.1 F (36.7 C)     Temp Source Oral     SpO2 100 %     Weight 129 lb (58.5 kg)     Height 5' (1.524 m)     Head Circumference  Peak Flow      Pain Score 4     Pain Loc      Pain Edu?      Excl. in [place]?     Constitutional: Alert and oriented. Well appearing and in no acute distress. Eyes: Conjunctivae are normal.  Head: Atraumatic. Nose: No congestion/rhinnorhea. Mouth/Throat: Mucous membranes are moist.   Neck:  supple no lymphadenopathy noted Cardiovascular: Normal rate, regular rhythm. Heart sounds are normal Respiratory: Normal respiratory effort.  No retractions, lungs with a large amount of wheezing bilaterally  abd: soft nontender bs normal all 4 quad GU: deferred Musculoskeletal: FROM all extremities, warm and well perfused Neurologic:  Normal speech and language.  Skin:  Skin is warm, dry and intact. No rash noted. Psychiatric: Mood and affect are normal. Speech and behavior are normal.  ____________________________________________   LABS (all labs ordered are listed, but only abnormal results are displayed)  Labs Reviewed  BASIC METABOLIC PANEL  CBC  HEPATIC FUNCTION PANEL  LIPASE, BLOOD  URINALYSIS, COMPLETE (UACMP) WITH MICROSCOPIC  TROPONIN I (HIGH SENSITIVITY)  TROPONIN I (HIGH SENSITIVITY)    ____________________________________________   ____________________________________________  RADIOLOGY  Chest x-ray CT abdomen/pelvis  ____________________________________________   PROCEDURES  Procedure(s) performed: No  Procedures    ____________________________________________   INITIAL IMPRESSION / ASSESSMENT AND PLAN / ED COURSE  Pertinent labs & imaging results that were available during my care of the patient were reviewed by me and considered in my medical decision making (see chart for details).   Patient 75 year old female presents with right-sided chest discomfort that radiates to her back.  Symptoms for 2 days.  DDx: CAP, COPD exasperation, kidney stone, acute cholecystitis, small bowel obstruction  Labs are reassuring CBC, basic metabolic panel and troponin are all normal  Chest x-ray reviewed by me confirmed by radiology to have no acute abnormality  CT abdomen/pelvis with IV contrast  Bp elevated at 210/103, will give catapress 0.2  Second troponin is negative  Ct abd/pelvis shows cyst on liver and some fluid in pelvis, otherwise neg  [place] ruq reviewed by me and Dr. Joni Fears appears to be normal  Patient states she is feeling much better.  She does not want to take steroids.  I instructed her to return to her Symbicort inhaler and albuterol inhaler.  Follow-up with Dr. Ginette Pitman for her blood pressure.  Return emergency department if her symptoms are worsening.  She was discharged in stable condition.    Sonya Gaines was evaluated in Emergency Department on 06/07/2021 for the symptoms described in the history of present illness. She was evaluated in the context of the global COVID-19 pandemic, which necessitated consideration that the patient might be at risk for infection with the SARS-CoV-2 virus that causes COVID-19. Institutional protocols and algorithms that pertain to the evaluation of patients at risk for COVID-19 are in a state of rapid change  based on information released by regulatory bodies including the CDC and federal and state organizations. These policies and algorithms were followed during the patient's care in the ED.    As part of my medical decision making, I reviewed the following data within the [place] notes reviewed and incorporated, Labs reviewed , EKG interpreted NSR, Old chart reviewed, Radiograph reviewed , Notes from prior ED visits, and [place] Controlled Substance Database  ____________________________________________   FINAL CLINICAL IMPRESSION(S) / ED DIAGNOSES  Final diagnoses:  RUQ pain  Chest wall pain      NEW MEDICATIONS STARTED DURING  THIS VISIT:  New Prescriptions   PREDNISONE (STERAPRED UNI-PAK 21 TAB) 10 MG (21) TBPK TABLET    Take 6 pills on day one then decrease by 1 pill each day     Note:  This document was prepared using Dragon voice recognition software and may include unintentional dictation errors.    Versie Starks, PA-C 06/07/21 1510    Carrie Mew, MD 06/07/21 [phone]

## 2021-06-16 ENCOUNTER — Emergency Department: Payer: Medicare PPO

## 2021-06-16 ENCOUNTER — Other Ambulatory Visit: Payer: Self-pay

## 2021-06-16 ENCOUNTER — Encounter: Payer: Self-pay | Admitting: Emergency Medicine

## 2021-06-16 ENCOUNTER — Emergency Department
Admission: EM | Admit: 2021-06-16 | Discharge: 2021-06-16 | Disposition: A | Discharge status: Home / Self Care | Payer: Medicare PPO | Attending: Emergency Medicine | Admitting: Emergency Medicine

## 2021-06-16 DIAGNOSIS — J45909 Unspecified asthma, uncomplicated: Secondary | ICD-10-CM | POA: Diagnosis not present

## 2021-06-16 DIAGNOSIS — Z87891 Personal history of nicotine dependence: Secondary | ICD-10-CM | POA: Insufficient documentation

## 2021-06-16 DIAGNOSIS — J1282 Pneumonia due to coronavirus disease 2019: Secondary | ICD-10-CM

## 2021-06-16 DIAGNOSIS — Z7951 Long term (current) use of inhaled steroids: Secondary | ICD-10-CM | POA: Insufficient documentation

## 2021-06-16 DIAGNOSIS — Z79899 Other long term (current) drug therapy: Secondary | ICD-10-CM | POA: Insufficient documentation

## 2021-06-16 DIAGNOSIS — R0602 Shortness of breath: Secondary | ICD-10-CM | POA: Diagnosis present

## 2021-06-16 DIAGNOSIS — J449 Chronic obstructive pulmonary disease, unspecified: Secondary | ICD-10-CM | POA: Insufficient documentation

## 2021-06-16 DIAGNOSIS — I10 Essential (primary) hypertension: Secondary | ICD-10-CM | POA: Insufficient documentation

## 2021-06-16 DIAGNOSIS — U071 COVID-19: Secondary | ICD-10-CM | POA: Diagnosis not present

## 2021-06-16 LAB — D-DIMER, QUANTITATIVE: D-Dimer, Quant: 0.85 ug/mL-FEU — ABNORMAL HIGH (ref 0.00–0.50)

## 2021-06-16 LAB — CBC WITH DIFFERENTIAL/PLATELET
Abs Immature Granulocytes: 0.03 10*3/uL (ref 0.00–0.07)
Basophils Absolute: 0.1 10*3/uL (ref 0.0–0.1)
Basophils Relative: 1 %
Eosinophils Absolute: 0 10*3/uL (ref 0.0–0.5)
Eosinophils Relative: 0 %
HCT: 41.8 % (ref 36.0–46.0)
Hemoglobin: 14.2 g/dL (ref 12.0–15.0)
Immature Granulocytes: 0 %
Lymphocytes Relative: 15 %
Lymphs Abs: 1.2 10*3/uL (ref 0.7–4.0)
MCH: 29.5 pg (ref 26.0–34.0)
MCHC: 34 g/dL (ref 30.0–36.0)
MCV: 86.9 fL (ref 80.0–100.0)
Monocytes Absolute: 0.7 10*3/uL (ref 0.1–1.0)
Monocytes Relative: 9 %
Neutro Abs: 5.7 10*3/uL (ref 1.7–7.7)
Neutrophils Relative %: 75 %
Platelets: 170 10*3/uL (ref 150–400)
RBC: 4.81 MIL/uL (ref 3.87–5.11)
RDW: 14 % (ref 11.5–15.5)
WBC: 7.7 10*3/uL (ref 4.0–10.5)
nRBC: 0 % (ref 0.0–0.2)

## 2021-06-16 LAB — COMPREHENSIVE METABOLIC PANEL
ALT: 18 U/L (ref 0–44)
AST: 23 U/L (ref 15–41)
Albumin: 3.9 g/dL (ref 3.5–5.0)
Alkaline Phosphatase: 81 U/L (ref 38–126)
Anion gap: 6 (ref 5–15)
BUN: 15 mg/dL (ref 8–23)
CO2: 27 mmol/L (ref 22–32)
Calcium: 9.1 mg/dL (ref 8.9–10.3)
Chloride: 103 mmol/L (ref 98–111)
Creatinine, Ser: 0.79 mg/dL (ref 0.44–1.00)
GFR, Estimated: >60 mL/min (ref >60)
Glucose, Bld: 105 mg/dL — ABNORMAL HIGH (ref 70–99)
Potassium: 4.1 mmol/L (ref 3.5–5.1)
Sodium: 136 mmol/L (ref 135–145)
Total Bilirubin: 0.7 mg/dL (ref 0.3–1.2)
Total Protein: 7.1 g/dL (ref 6.5–8.1)

## 2021-06-16 LAB — RESP PANEL BY RT-PCR (FLU A&B, COVID) ARPGX2
Influenza A by PCR: NEGATIVE
Influenza B by PCR: NEGATIVE
SARS Coronavirus 2 by RT PCR: POSITIVE — AB

## 2021-06-16 LAB — PROCALCITONIN: Procalcitonin: <0.1 ng/mL

## 2021-06-16 LAB — TROPONIN I (HIGH SENSITIVITY): Troponin I (High Sensitivity): 3 ng/L (ref <18)

## 2021-06-16 LAB — BRAIN NATRIURETIC PEPTIDE: B Natriuretic Peptide: 97.9 pg/mL (ref 0.0–100.0)

## 2021-06-16 MED ORDER — DOXYCYCLINE MONOHYDRATE 100 MG PO TABS: 100.0000 mg | ORAL_TABLET | Freq: Two times a day (BID) | ORAL | 0 refills | Status: AC

## 2021-06-16 MED ORDER — SODIUM CHLORIDE 0.9 % IV BOLUS
500.0000 mL | Freq: Once | INTRAVENOUS | Status: AC
  Administered 2021-06-16: 500 mL via INTRAVENOUS

## 2021-06-16 MED ORDER — CEFUROXIME AXETIL 250 MG PO TABS: 500.0000 mg | ORAL_TABLET | Freq: Two times a day (BID) | ORAL | 0 refills | Status: AC

## 2021-06-16 MED ORDER — IOHEXOL 350 MG/ML SOLN
75.0000 mL | Freq: Once | INTRAVENOUS | Status: AC | PRN
  Administered 2021-06-16: 75 mL via INTRAVENOUS

## 2021-06-16 MED ORDER — ACETAMINOPHEN 500 MG PO TABS
1000.0000 mg | ORAL_TABLET | Freq: Once | ORAL | Status: AC
  Administered 2021-06-16: 1000 mg via ORAL
  Filled 2021-06-16: qty 2

## 2021-06-16 MED ORDER — NIRMATRELVIR/RITONAVIR (PAXLOVID)TABLET: 3.0000 | ORAL_TABLET | Freq: Two times a day (BID) | ORAL | 0 refills | Status: AC

## 2021-06-16 MED ORDER — IPRATROPIUM-ALBUTEROL 0.5-2.5 (3) MG/3ML IN SOLN
3.0000 mL | Freq: Once | RESPIRATORY_TRACT | Status: AC
  Administered 2021-06-16: 3 mL via RESPIRATORY_TRACT
  Filled 2021-06-16: qty 3

## 2021-06-16 NOTE — ED Triage Notes (Signed)
Pt via POV from [facility]. [place] sent pt over here for diaphoresis and hypotension. [place] reports a B of 78/46. Pt is c/o cough and SOB since yesterday. Pt was seen recently in the ED but was d/c. Pt is A&Ox4 and NAD.

## 2021-06-16 NOTE — Discharge Instructions (Addendum)
You have COVID-pneumonia.  We are starting you on a treatment called paxlovid to decrease your risk for hospitalization.  However there is always a chance that you could develop worsening shortness of breath and in that case you need to return to the ER.  In case there is any bacterial pneumonia given your sick just a few days ago we will start you on some antibiotics as well.  1. No pulmonary emboli. 2. Multifocal pneumonia in the lungs, right greater than left, as above. 3. Calcified atherosclerosis in the proximal LAD/coronary artery. 4. Calcified atherosclerosis in the nonaneurysmal thoracic aorta.

## 2021-06-16 NOTE — ED Notes (Signed)
Removed myself due to having a Level 1 unstable patient in another room. Charge nurse Levada Dy is aware. Renato Gails RN has seen the patient.

## 2021-06-16 NOTE — ED Provider Notes (Signed)
[facility] Emergency Department Provider Note  ____________________________________________   Event Date/Time   First MD Initiated Contact with Patient 06/16/21 1036     (approximate)  I have reviewed the triage vital signs and the nursing notes.   HISTORY  Chief Complaint Shortness of Breath and Cough    HPI Sonya Gaines is a 75 y.o. female with COPD who comes in with worsening shortness of breath and cough.  Patient reports that she was seen here in the emergency room about a week ago and started feeling better but then started getting worse again.  Stated that she was feeling more short of breath and having a continued coughing.  Patient been taking her inhalers without relief.  Nothing makes it better.  Patient went over to [facility] and found to have a blood pressure of 78/46 and was diaphoretic so sent over here for further evaluation.  Denies any abdominal pain, falls      Past Medical History:  Diagnosis Date   Asthma    Slight   COPD (chronic obstructive pulmonary disease) (HCC)    GERD (gastroesophageal reflux disease)    Glaucoma    Heart murmur    Hyperlipidemia    Hypertension    Macular degeneration    OSA (obstructive sleep apnea)    not on CPAP    Osteoporosis     Patient Active Problem List   Diagnosis Date Noted   Closed right hip fracture ([place]) 03/10/2017   Cough variant asthma 01/25/2015   GASTROESOPHAGEAL REFLUX DISEASE 04/27/2009   DYSPNEA 03/20/2009   HYPERTENSION 01/26/2009   COUGH, CHRONIC 01/26/2009    Past Surgical History:  Procedure Laterality Date   ABDOMINAL HYSTERECTOMY     BREAST CYST ASPIRATION Right YRS AGO   COLONOSCOPY WITH PROPOFOL N/A 07/14/2019   Procedure: COLONOSCOPY WITH PROPOFOL;  Surgeon: Toledo, Benay Pike, MD;  Location: [facility] ENDOSCOPY;  Service: Endoscopy;  Laterality: N/A;   GLAUCOMA SURGERY     x 2    HIP PINNING,CANNULATED Right 03/12/2017   Procedure: CANNULATED HIP PINNING;  Surgeon:  Thornton Park, MD;  Location: [facility] ORS;  Service: Orthopedics;  Laterality: Right;   NASAL SINUS SURGERY     x 3    VESICOVAGINAL FISTULA CLOSURE W/ TAH      Prior to Admission medications   Medication Sig Start Date End Date Taking? Authorizing Provider  albuterol (VENTOLIN HFA) 108 (90 Base) MCG/ACT inhaler Inhale 2 puffs into the lungs every 6 (six) hours as needed for wheezing or shortness of breath.    [provider]  alendronate (FOSAMAX) 70 MG tablet Take 70 mg by mouth once a week. Take with a full glass of water on an empty stomach.    [provider]  budesonide-formoterol (SYMBICORT) 160-4.5 MCG/ACT inhaler Inhale 2 puffs into the lungs 2 (two) times daily.    [provider]  calcium-vitamin D (SM CALCIUM 500/VITAMIN D3) 500-400 MG-UNIT tablet Take 1 tablet by mouth at bedtime.    [provider]  dorzolamide-timolol (COSOPT) 22.3-6.8 MG/ML ophthalmic solution Place 1 drop into the right eye 2 (two) times daily.     [provider]  doxycycline (PERIOSTAT) 20 MG tablet Take 20 mg by mouth 2 (two) times daily.    [provider]  enoxaparin (LOVENOX) 40 MG/0.4ML injection Inject 0.4 mLs (40 mg total) into the skin daily. 03/14/17 03/26/17  Vaughan Basta, MD  esomeprazole (NEXIUM) 40 MG capsule Take 40 mg by mouth daily.  11/05/16   [provider]  ezetimibe (ZETIA) 10 MG tablet Take 10 mg by mouth daily.    [provider]  hydrALAZINE (APRESOLINE) 10 MG tablet Take 1 tablet (10 mg total) by mouth 2 (two) times daily. 02/01/19 02/01/20  Rudene Re, MD  hydrALAZINE (APRESOLINE) 10 MG tablet Take 10 mg by mouth 2 (two) times daily.    [provider]  latanoprost (XALATAN) 0.005 % ophthalmic solution Place 1 drop into the right eye at bedtime.     [provider]  Lifitegrast Shirley Friar) 5 % SOLN Apply 1 drop to eye 2 (two) times daily. Shirley Friar    [provider]  losartan  (COZAAR) 50 MG tablet Take 50 mg by mouth daily.    [provider]  loteprednol (LOTEMAX) 0.5 % ophthalmic suspension Place 1 drop into both eyes at bedtime.    [provider]  magnesium citrate SOLN Take 296 mLs (1 Bottle total) by mouth once as needed for severe constipation. 03/14/17   Vaughan Basta, MD  montelukast (SINGULAIR) 10 MG tablet Take 10 mg by mouth at bedtime.    [provider]  Multiple Vitamins-Minerals (PRESERVISION AREDS) TABS Take 1 tablet by mouth 2 (two) times daily.     [provider]  Multiple Vitamins-Minerals (WOMENS 50+ MULTI VITAMIN/MIN) TABS Take 1 tablet by mouth daily.    [provider]  Netarsudil Dimesylate (RHOPRESSA) 0.02 % SOLN Place 1 drop into the right eye at bedtime.    [provider]  oxyCODONE (OXY IR/ROXICODONE) 5 MG immediate release tablet Take 1 tablet (5 mg total) by mouth every 6 (six) hours as needed for severe pain or breakthrough pain ((for MODERATE breakthrough pain)). 03/14/17   Vaughan Basta, MD  predniSONE (STERAPRED UNI-PAK 21 TAB) 10 MG (21) TBPK tablet Take 6 pills on day one then decrease by 1 pill each day 06/07/21   Versie Starks, PA-C  senna-docusate (SENOKOT-S) 8.6-50 MG tablet Take 1 tablet by mouth at bedtime as needed for mild constipation. 03/14/17   Vaughan Basta, MD    Allergies Apraclonidine, Cheese, Chocolate flavor, Lac bovis, Brimonidine, Clarithromycin, Clindamycin/lincomycin, Levofloxacin, Nickel, Penicillins, Polytrim [polymyxin b-trimethoprim], Statins, Sulfamethoxazole, Sulfonamide derivatives, and Tobramycin  Family History  Problem Relation Age of Onset   Emphysema Father        smoked   Heart disease Father    Heart disease Brother    Breast cancer Maternal Aunt    Lung cancer Paternal Grandfather        never smoker   Lung cancer Son        never smoker    Social History Social History   Tobacco Use   Smoking status:  Former    Packs/day: 1.50    Years: 30.00    Pack years: 45.00    Types: Cigarettes    Quit date: 11/19/1991    Years since quitting: 29.5   Smokeless tobacco: Never  Substance Use Topics   Alcohol use: No    Alcohol/week: 0.0 standard drinks   Drug use: No      Review of Systems Constitutional: No fever/chills Eyes: No visual changes. ENT: No sore throat. Cardiovascular: No chest pain Respiratory: Positive for SOB, cough Gastrointestinal: No abdominal pain.  No nausea, no vomiting.  No diarrhea.  No constipation. Genitourinary: Negative for dysuria. Musculoskeletal: Negative for back pain. Skin: Negative for rash. Neurological: Negative for headaches, focal weakness or numbness. All other ROS negative ____________________________________________   PHYSICAL EXAM:  VITAL SIGNS: ED Triage Vitals  Enc Vitals Group     BP 06/16/21 1037 117/78     Pulse Rate 06/16/21 1037 65     Resp 06/16/21 1037 20     Temp 06/16/21 1042 98.3 F (36.8 C)     Temp Source 06/16/21 1042 Oral     SpO2 06/16/21 1037 95 %     Weight 06/16/21 1038 127 lb 13.9 oz (58 kg)     Height 06/16/21 1038 5' (1.524 m)     Head Circumference --      Peak Flow --      Pain Score 06/16/21 1038 6     Pain Loc --      Pain Edu? --      Excl. in [place]? --     Constitutional: Alert and oriented. Well appearing and in no acute distress. Eyes: Conjunctivae are normal. EOMI. Head: Atraumatic. Nose: No congestion/rhinnorhea. Mouth/Throat: Mucous membranes are moist.   Neck: No stridor. Trachea Midline. FROM Cardiovascular: Normal rate, regular rhythm. Grossly normal heart sounds.  Good peripheral circulation. Respiratory: Clear lungs, no increased work of breathing Gastrointestinal: Soft and nontender. No distention. No abdominal bruits.  Musculoskeletal: No lower extremity tenderness nor edema.  No joint effusions. Neurologic:  Normal speech and language. No gross focal neurologic deficits are appreciated.   Skin:  Skin is warm, dry and intact. No rash noted. Psychiatric: Mood and affect are normal. Speech and behavior are normal. GU: Deferred   ____________________________________________   LABS (all labs ordered are listed, but only abnormal results are displayed)  Labs Reviewed  COMPREHENSIVE METABOLIC PANEL - Abnormal; Notable for the following components:      Result Value   Glucose, Bld 105 (*)    All other components within normal limits  D-DIMER, QUANTITATIVE - Abnormal; Notable for the following components:   D-Dimer, Quant 0.85 (*)    All other components within normal limits  RESP PANEL BY RT-PCR (FLU A&B, COVID) ARPGX2  CBC WITH DIFFERENTIAL/PLATELET  BRAIN NATRIURETIC PEPTIDE  URINALYSIS, COMPLETE (UACMP) WITH MICROSCOPIC  TROPONIN I (HIGH SENSITIVITY)  TROPONIN I (HIGH SENSITIVITY)   ____________________________________________   ED ECG REPORT I, Vanessa [place], the attending physician, personally viewed and interpreted this ECG.  Normal sinus rate of 64, no ST elevation, no T wave inversions, normal intervals ____________________________________________  RADIOLOGY Robert Bellow, personally viewed and evaluated these images (plain radiographs) as part of my medical decision making, as well as reviewing the written report by the radiologist.  ED MD interpretation: No pneumonia  Official radiology report(s): DG Chest 2 View  Result Date: 06/16/2021 CLINICAL DATA:  Short of breath, diaphoresis and hypotension. EXAM: CHEST - 2 VIEW COMPARISON:  06/07/2021 FINDINGS: The heart size and mediastinal contours are within normal limits. No pleural effusion or edema. Lungs are hyperinflated but clear. The visualized skeletal structures are unremarkable. IMPRESSION: No acute cardiopulmonary abnormalities. Electronically Signed   By: Kerby Moors M.D.   On: 06/16/2021 11:51   CT Angio Chest PE W and/or Wo Contrast  Result Date: 06/16/2021 CLINICAL DATA:  PE suspected.  Diaphoresis. Hypertension. Cough. Runny nose. EXAM: CT ANGIOGRAPHY CHEST WITH CONTRAST TECHNIQUE: Multidetector CT imaging of the chest was performed using the standard protocol during bolus administration of intravenous contrast. Multiplanar CT image reconstructions and MIPs were obtained to evaluate the vascular anatomy. CONTRAST:  46m OMNIPAQUE IOHEXOL 350 MG/ML SOLN COMPARISON:  Chest x-ray June 16, 2021 FINDINGS: Cardiovascular: Calcified atherosclerosis in the proximal LAD.  Heart size is normal. Mild calcified atherosclerosis in the nonaneurysmal thoracic aorta without evidence of dissection. No pulmonary emboli. Mediastinum/Nodes: No enlarged mediastinal, hilar, or axillary lymph nodes. Thyroid gland, trachea, and esophagus demonstrate no significant findings. Lungs/Pleura: Central airways are normal. No pneumothorax. Patchy pulmonary infiltrates identified in the posterior right upper lobe on series 6, image 35, the inferior right upper lobe on series 6, image 47, the right middle lobe on series 6, image 66, and the right lower lobe on images 71 8 and 83 of series 6. There is mild patchy infiltrate in the left base as well on series 6, image 77. No nodules or masses. No other abnormalities. Upper Abdomen: No acute abnormality. Musculoskeletal: No chest wall abnormality. No acute or significant osseous findings. Review of the MIP images confirms the above findings. IMPRESSION: 1. No pulmonary emboli. 2. Multifocal pneumonia in the lungs, right greater than left, as above. 3. Calcified atherosclerosis in the proximal LAD/coronary artery. 4. Calcified atherosclerosis in the nonaneurysmal thoracic aorta. Aortic Atherosclerosis (ICD10-I70.0). Electronically Signed   By: Dorise Bullion III M.D   On: 06/16/2021 13:14    ____________________________________________   PROCEDURES  Procedure(s) performed (including Critical Care):  .1-3 Lead EKG Interpretation  Date/Time: 06/16/2021 12:25 PM Performed by:  Vanessa [place], MD Authorized by: Vanessa Villalba, MD     Interpretation: normal     ECG rate:  60s   ECG rate assessment: normal     Rhythm: sinus rhythm     Ectopy: none     Conduction: normal     ____________________________________________   INITIAL IMPRESSION / ASSESSMENT AND PLAN / ED COURSE   MALACHI ALDERETE was evaluated in Emergency Department on 06/16/2021 for the symptoms described in the history of present illness. She was evaluated in the context of the global COVID-19 pandemic, which necessitated consideration that the patient might be at risk for infection with the SARS-CoV-2 virus that causes COVID-19. Institutional protocols and algorithms that pertain to the evaluation of patients at risk for COVID-19 are in a state of rapid change based on information released by regulatory bodies including the CDC and federal and state organizations. These policies and algorithms were followed during the patient's care in the ED.     Pt presents with SOB. Differential includes: PNA-will get xray to evaluation Anemia-CBC to evaluate ACS- will get trops Arrhythmia-Will get EKG and keep on monitor.  COVID- will get testing per algorithm. PE-we will get D-dimer  To be secondary to her COPD but she does not have significant wheezing on examination.  We will trial some DuoNeb  Labs are reassuring but D-dimer is elevated therefore we will proceed with CT PE  CT imaging shows multifocal pneumonia right greater than left as well as an incidental findings.  I did provide a copy of report.  Given patient did have an illness previously then got better and then developed this 2 days ago with her to note that the [place] could just be incidental from a prior infection and she could developing a pneumonia.  Her procalcitonin is negative which is reassuring given all of her comorbidities I think it be reasonable to start her on some antibiotics in case this is an developing bacterial infection.  Given  all of her allergies will start on some cefuroxime and Doxy.  Patient is okay with doing the paxlovid and I did discuss with pharmacy and she has no medication interactions  Will get ambulatory sat.   Ambulatory sat was  normal and patient feels comfortable with discharge home and will return if she develops worsening symptoms      ____________________________________________   FINAL CLINICAL IMPRESSION(S) / ED DIAGNOSES   Final diagnoses:  Pneumonia due to COVID-19 virus     MEDICATIONS GIVEN DURING THIS VISIT:  Medications  acetaminophen (TYLENOL) tablet 1,000 mg (1,000 mg Oral Given 06/16/21 1131)  ipratropium-albuterol (DUONEB) 0.5-2.5 (3) MG/3ML nebulizer solution 3 mL (3 mLs Nebulization Given 06/16/21 1133)  sodium chloride 0.9 % bolus 500 mL (0 mLs Intravenous Stopped 06/16/21 1446)  iohexol (OMNIPAQUE) 350 MG/ML injection 75 mL (75 mLs Intravenous Contrast Given 06/16/21 1244)     ED Discharge Orders          Ordered    nirmatrelvir/ritonavir EUA (PAXLOVID) TABS  2 times daily        06/16/21 1453    cefUROXime (CEFTIN) 250 MG tablet  2 times daily with meals        06/16/21 1504    doxycycline (ADOXA) 100 MG tablet  2 times daily        06/16/21 1504             Note:  This document was prepared using Dragon voice recognition software and may include unintentional dictation errors.   Vanessa [place], MD 06/16/21 [phone]

## 2021-08-14 ENCOUNTER — Other Ambulatory Visit: Payer: Self-pay | Admitting: Internal Medicine

## 2021-08-14 DIAGNOSIS — Z1231 Encounter for screening mammogram for malignant neoplasm of breast: Secondary | ICD-10-CM

## 2021-08-31 ENCOUNTER — Ambulatory Visit
Admission: RE | Admit: 2021-08-31 | Discharge: 2021-08-31 | Disposition: A | Discharge status: Home / Self Care | Payer: Medicare PPO | Source: Ambulatory Visit | Attending: Internal Medicine | Admitting: Internal Medicine

## 2021-08-31 ENCOUNTER — Other Ambulatory Visit: Payer: Self-pay

## 2021-08-31 DIAGNOSIS — Z1231 Encounter for screening mammogram for malignant neoplasm of breast: Secondary | ICD-10-CM | POA: Diagnosis not present

## 2021-09-06 ENCOUNTER — Emergency Department
Admission: EM | Admit: 2021-09-06 | Discharge: 2021-09-06 | Discharge status: Against Medical Advice | Payer: Medicare PPO

## 2021-09-15 ENCOUNTER — Other Ambulatory Visit: Payer: Self-pay

## 2021-09-15 ENCOUNTER — Inpatient Hospital Stay
Admission: EM | Admit: 2021-09-15 | Discharge: 2021-09-18 | DRG: 194 | Disposition: A | Discharge status: Home / Self Care | Payer: Medicare PPO | Attending: Internal Medicine | Admitting: Internal Medicine

## 2021-09-15 ENCOUNTER — Emergency Department: Payer: Medicare PPO

## 2021-09-15 ENCOUNTER — Encounter: Payer: Self-pay | Admitting: Emergency Medicine

## 2021-09-15 DIAGNOSIS — Z91018 Allergy to other foods: Secondary | ICD-10-CM

## 2021-09-15 DIAGNOSIS — Z825 Family history of asthma and other chronic lower respiratory diseases: Secondary | ICD-10-CM

## 2021-09-15 DIAGNOSIS — J209 Acute bronchitis, unspecified: Secondary | ICD-10-CM | POA: Diagnosis present

## 2021-09-15 DIAGNOSIS — G4733 Obstructive sleep apnea (adult) (pediatric): Secondary | ICD-10-CM | POA: Diagnosis present

## 2021-09-15 DIAGNOSIS — Z20822 Contact with and (suspected) exposure to covid-19: Secondary | ICD-10-CM | POA: Diagnosis present

## 2021-09-15 DIAGNOSIS — J441 Chronic obstructive pulmonary disease with (acute) exacerbation: Secondary | ICD-10-CM | POA: Diagnosis not present

## 2021-09-15 DIAGNOSIS — Z880 Allergy status to penicillin: Secondary | ICD-10-CM

## 2021-09-15 DIAGNOSIS — B338 Other specified viral diseases: Secondary | ICD-10-CM | POA: Diagnosis present

## 2021-09-15 DIAGNOSIS — Z8249 Family history of ischemic heart disease and other diseases of the circulatory system: Secondary | ICD-10-CM

## 2021-09-15 DIAGNOSIS — K219 Gastro-esophageal reflux disease without esophagitis: Secondary | ICD-10-CM

## 2021-09-15 DIAGNOSIS — M810 Age-related osteoporosis without current pathological fracture: Secondary | ICD-10-CM | POA: Diagnosis present

## 2021-09-15 DIAGNOSIS — Z881 Allergy status to other antibiotic agents status: Secondary | ICD-10-CM

## 2021-09-15 DIAGNOSIS — Z87891 Personal history of nicotine dependence: Secondary | ICD-10-CM

## 2021-09-15 DIAGNOSIS — J189 Pneumonia, unspecified organism: Secondary | ICD-10-CM

## 2021-09-15 DIAGNOSIS — E785 Hyperlipidemia, unspecified: Secondary | ICD-10-CM | POA: Diagnosis present

## 2021-09-15 DIAGNOSIS — Z9189 Other specified personal risk factors, not elsewhere classified: Secondary | ICD-10-CM

## 2021-09-15 DIAGNOSIS — Z888 Allergy status to other drugs, medicaments and biological substances status: Secondary | ICD-10-CM

## 2021-09-15 DIAGNOSIS — I10 Essential (primary) hypertension: Secondary | ICD-10-CM | POA: Diagnosis present

## 2021-09-15 DIAGNOSIS — H3530 Unspecified macular degeneration: Secondary | ICD-10-CM | POA: Diagnosis present

## 2021-09-15 DIAGNOSIS — Z8616 Personal history of COVID-19: Secondary | ICD-10-CM

## 2021-09-15 DIAGNOSIS — Z7951 Long term (current) use of inhaled steroids: Secondary | ICD-10-CM

## 2021-09-15 DIAGNOSIS — Z7983 Long term (current) use of bisphosphonates: Secondary | ICD-10-CM

## 2021-09-15 DIAGNOSIS — Z90710 Acquired absence of both cervix and uterus: Secondary | ICD-10-CM

## 2021-09-15 DIAGNOSIS — Z882 Allergy status to sulfonamides status: Secondary | ICD-10-CM

## 2021-09-15 DIAGNOSIS — H409 Unspecified glaucoma: Secondary | ICD-10-CM

## 2021-09-15 DIAGNOSIS — B974 Respiratory syncytial virus as the cause of diseases classified elsewhere: Secondary | ICD-10-CM | POA: Diagnosis present

## 2021-09-15 LAB — COMPREHENSIVE METABOLIC PANEL
ALT: 15 U/L (ref 0–44)
AST: 19 U/L (ref 15–41)
Albumin: 3.3 g/dL — ABNORMAL LOW (ref 3.5–5.0)
Alkaline Phosphatase: 67 U/L (ref 38–126)
Anion gap: 10 (ref 5–15)
BUN: 13 mg/dL (ref 8–23)
CO2: 23 mmol/L (ref 22–32)
Calcium: 9.1 mg/dL (ref 8.9–10.3)
Chloride: 99 mmol/L (ref 98–111)
Creatinine, Ser: 0.93 mg/dL (ref 0.44–1.00)
GFR, Estimated: >60 mL/min (ref >60)
Glucose, Bld: 201 mg/dL — ABNORMAL HIGH (ref 70–99)
Potassium: 4 mmol/L (ref 3.5–5.1)
Sodium: 132 mmol/L — ABNORMAL LOW (ref 135–145)
Total Bilirubin: 0.7 mg/dL (ref 0.3–1.2)
Total Protein: 6.9 g/dL (ref 6.5–8.1)

## 2021-09-15 LAB — CBC WITH DIFFERENTIAL/PLATELET
Abs Immature Granulocytes: 0.08 10*3/uL — ABNORMAL HIGH (ref 0.00–0.07)
Basophils Absolute: 0.1 10*3/uL (ref 0.0–0.1)
Basophils Relative: 0 %
Eosinophils Absolute: 0.2 10*3/uL (ref 0.0–0.5)
Eosinophils Relative: 1 %
HCT: 36.4 % (ref 36.0–46.0)
Hemoglobin: 12.6 g/dL (ref 12.0–15.0)
Immature Granulocytes: 1 %
Lymphocytes Relative: 10 %
Lymphs Abs: 1.7 10*3/uL (ref 0.7–4.0)
MCH: 30 pg (ref 26.0–34.0)
MCHC: 34.6 g/dL (ref 30.0–36.0)
MCV: 86.7 fL (ref 80.0–100.0)
Monocytes Absolute: 1 10*3/uL (ref 0.1–1.0)
Monocytes Relative: 6 %
Neutro Abs: 13.4 10*3/uL — ABNORMAL HIGH (ref 1.7–7.7)
Neutrophils Relative %: 82 %
Platelets: 287 10*3/uL (ref 150–400)
RBC: 4.2 MIL/uL (ref 3.87–5.11)
RDW: 13.4 % (ref 11.5–15.5)
WBC: 16.4 10*3/uL — ABNORMAL HIGH (ref 4.0–10.5)
nRBC: 0 % (ref 0.0–0.2)

## 2021-09-15 LAB — LACTIC ACID, PLASMA: Lactic Acid, Venous: 1.5 mmol/L (ref 0.5–1.9)

## 2021-09-15 MED ORDER — IPRATROPIUM-ALBUTEROL 0.5-2.5 (3) MG/3ML IN SOLN
3.0000 mL | Freq: Once | RESPIRATORY_TRACT | Status: AC
  Administered 2021-09-16: 3 mL via RESPIRATORY_TRACT
  Filled 2021-09-15: qty 3

## 2021-09-15 MED ORDER — METHYLPREDNISOLONE SODIUM SUCC 125 MG IJ SOLR
125.0000 mg | Freq: Once | INTRAMUSCULAR | Status: AC
  Administered 2021-09-16: 125 mg via INTRAVENOUS
  Filled 2021-09-15: qty 2

## 2021-09-15 NOTE — ED Notes (Signed)
1 set of blood cultures and rainbow sent to lab including lactic.

## 2021-09-15 NOTE — ED Provider Notes (Signed)
[facility] Emergency Department Provider Note  ____________________________________________   Event Date/Time   First MD Initiated Contact with Patient 09/15/21 2335     (approximate)  I have reviewed the triage vital signs and the nursing notes.   HISTORY  Chief Complaint Shortness of Breath    HPI Sonya Gaines is a 75 y.o. female with history of hypertension, hyperlipidemia, COPD, previous history of tobacco use who presents to the emergency department with a 1 month of coughing.  States initially was productive but now is mostly nonproductive.  She states that her chest has been hurting from coughing but also feels like a tightness.  She states over the last 2 days she has had fevers of 103.  States she has been wheezing and feeling short of breath.  She does not wear oxygen at home.  She had COVID-pneumonia on 06/16/2021 and took Paxlovid.  Took 10 days worth of Ceftin starting 06/27/2021.  Started on doxycycline and prednisone on 09/04/2021 which she states she has completed.  Seen by pulmonology on 09/07/2021 for recurrent bronchitis.  Was still testing positive for COVID-19 at that time.  States she is feeling weak.  Denies any abdominal pain, vomiting or diarrhea, dysuria or hematuria.  No known sick contacts.  Has had 3 COVID-19 vaccinations.  Has had her pneumonia and flu shots this year.         Past Medical History:  Diagnosis Date   Asthma    Slight   COPD (chronic obstructive pulmonary disease) (HCC)    GERD (gastroesophageal reflux disease)    Glaucoma    Heart murmur    Hyperlipidemia    Hypertension    Macular degeneration    OSA (obstructive sleep apnea)    not on CPAP    Osteoporosis     Patient Active Problem List   Diagnosis Date Noted   Closed right hip fracture ([place]) 03/10/2017   Cough variant asthma 01/25/2015   GASTROESOPHAGEAL REFLUX DISEASE 04/27/2009   DYSPNEA 03/20/2009   HYPERTENSION 01/26/2009   COUGH, CHRONIC  01/26/2009    Past Surgical History:  Procedure Laterality Date   ABDOMINAL HYSTERECTOMY     BREAST CYST ASPIRATION Right YRS AGO   COLONOSCOPY WITH PROPOFOL N/A 07/14/2019   Procedure: COLONOSCOPY WITH PROPOFOL;  Surgeon: Toledo, Benay Pike, MD;  Location: [facility] ENDOSCOPY;  Service: Endoscopy;  Laterality: N/A;   GLAUCOMA SURGERY     x 2    HIP PINNING,CANNULATED Right 03/12/2017   Procedure: CANNULATED HIP PINNING;  Surgeon: Thornton Park, MD;  Location: [facility] ORS;  Service: Orthopedics;  Laterality: Right;   NASAL SINUS SURGERY     x 3    VESICOVAGINAL FISTULA CLOSURE W/ TAH      Prior to Admission medications   Medication Sig Start Date End Date Taking? Authorizing Provider  albuterol (VENTOLIN HFA) 108 (90 Base) MCG/ACT inhaler Inhale 2 puffs into the lungs every 6 (six) hours as needed for wheezing or shortness of breath.    [provider]  alendronate (FOSAMAX) 70 MG tablet Take 70 mg by mouth once a week. Take with a full glass of water on an empty stomach.    [provider]  budesonide-formoterol (SYMBICORT) 160-4.5 MCG/ACT inhaler Inhale 2 puffs into the lungs 2 (two) times daily.    [provider]  calcium-vitamin D (SM CALCIUM 500/VITAMIN D3) 500-400 MG-UNIT tablet Take 1 tablet by mouth at bedtime.    [provider]  dorzolamide-timolol (COSOPT) 22.3-6.8  MG/ML ophthalmic solution Place 1 drop into the right eye 2 (two) times daily.     [provider]  doxycycline (PERIOSTAT) 20 MG tablet Take 20 mg by mouth 2 (two) times daily.    [provider]  enoxaparin (LOVENOX) 40 MG/0.4ML injection Inject 0.4 mLs (40 mg total) into the skin daily. 03/14/17 03/26/17  Vaughan Basta, MD  esomeprazole (NEXIUM) 40 MG capsule Take 40 mg by mouth daily. 11/05/16   [provider]  ezetimibe (ZETIA) 10 MG tablet Take 10 mg by mouth daily.    [provider]  hydrALAZINE (APRESOLINE) 10 MG tablet Take 1 tablet (10  mg total) by mouth 2 (two) times daily. 02/01/19 02/01/20  Rudene Re, MD  hydrALAZINE (APRESOLINE) 10 MG tablet Take 10 mg by mouth 2 (two) times daily.    [provider]  latanoprost (XALATAN) 0.005 % ophthalmic solution Place 1 drop into the right eye at bedtime.     [provider]  Lifitegrast Shirley Friar) 5 % SOLN Apply 1 drop to eye 2 (two) times daily. Shirley Friar    [provider]  losartan (COZAAR) 50 MG tablet Take 50 mg by mouth daily.    [provider]  loteprednol (LOTEMAX) 0.5 % ophthalmic suspension Place 1 drop into both eyes at bedtime.    [provider]  magnesium citrate SOLN Take 296 mLs (1 Bottle total) by mouth once as needed for severe constipation. 03/14/17   Vaughan Basta, MD  montelukast (SINGULAIR) 10 MG tablet Take 10 mg by mouth at bedtime.    [provider]  Multiple Vitamins-Minerals (PRESERVISION AREDS) TABS Take 1 tablet by mouth 2 (two) times daily.     [provider]  Multiple Vitamins-Minerals (WOMENS 50+ MULTI VITAMIN/MIN) TABS Take 1 tablet by mouth daily.    [provider]  Netarsudil Dimesylate (RHOPRESSA) 0.02 % SOLN Place 1 drop into the right eye at bedtime.    [provider]  oxyCODONE (OXY IR/ROXICODONE) 5 MG immediate release tablet Take 1 tablet (5 mg total) by mouth every 6 (six) hours as needed for severe pain or breakthrough pain ((for MODERATE breakthrough pain)). 03/14/17   Vaughan Basta, MD  predniSONE (STERAPRED UNI-PAK 21 TAB) 10 MG (21) TBPK tablet Take 6 pills on day one then decrease by 1 pill each day 06/07/21   Versie Starks, PA-C  senna-docusate (SENOKOT-S) 8.6-50 MG tablet Take 1 tablet by mouth at bedtime as needed for mild constipation. 03/14/17   Vaughan Basta, MD    Allergies Apraclonidine, Cheese, Chocolate flavor, Lac bovis, Brimonidine, Clarithromycin, Clindamycin/lincomycin, Levofloxacin, Nickel, Penicillins, Polytrim  [polymyxin b-trimethoprim], Statins, Sulfamethoxazole, Sulfonamide derivatives, and Tobramycin  Family History  Problem Relation Age of Onset   Emphysema Father        smoked   Heart disease Father    Heart disease Brother    Breast cancer Maternal Aunt    Lung cancer Paternal Grandfather        never smoker   Lung cancer Son        never smoker    Social History Social History   Tobacco Use   Smoking status: Former    Packs/day: 1.50    Years: 30.00    Pack years: 45.00    Types: Cigarettes    Quit date: 11/19/1991    Years since quitting: 29.8   Smokeless tobacco: Never  Substance Use Topics   Alcohol use: No    Alcohol/week: 0.0 standard drinks   Drug  use: No    Review of Systems Constitutional: + fever. Eyes: No visual changes. ENT: No sore throat. Cardiovascular: + chest pain. Respiratory: + shortness of breath. Gastrointestinal: No nausea, vomiting, diarrhea. Genitourinary: Negative for dysuria. Musculoskeletal: Negative for back pain. Skin: Negative for rash. Neurological: Negative for focal weakness or numbness.  ____________________________________________   PHYSICAL EXAM:  VITAL SIGNS: ED Triage Vitals [09/15/21 1752]  Enc Vitals Group     BP 128/70     Pulse Rate (!) 116     Resp (!) 25     Temp 99.4 F (37.4 C)     Temp Source Oral     SpO2 94 %     Weight 130 lb (59 kg)     Height 5' (1.524 m)     Head Circumference      Peak Flow      Pain Score 0     Pain Loc      Pain Edu?      Excl. in [place]?    CONSTITUTIONAL: Alert and oriented and responds appropriately to questions.  Elderly, nontoxic, afebrile HEAD: Normocephalic EYES: Conjunctivae clear, pupils appear equal, EOM appear intact ENT: normal nose; moist mucous membranes NECK: Supple, normal ROM CARD: Regular and tachycardic, S1 and S2 appreciated; no murmurs, no clicks, no rubs, no gallops RESP: Normal chest excursion without splinting or tachypnea; breath sounds equal  bilaterally, diffuse inspiratory and expiratory wheezes, diminished at bases, no rhonchi or rales, speaking full sentences, no respiratory distress, no hypoxia on room air ABD/GI: Normal bowel sounds; non-distended; soft, non-tender, no rebound, no guarding, no peritoneal signs, no hepatosplenomegaly BACK: The back appears normal EXT: Normal ROM in all joints; no deformity noted, no edema; no cyanosis, no calf tenderness or calf swelling SKIN: Normal color for age and race; warm; no rash on exposed skin NEURO: Moves all extremities equally PSYCH: The patient's mood and manner are appropriate.  ____________________________________________   LABS (all labs ordered are listed, but only abnormal results are displayed)  Labs Reviewed  COMPREHENSIVE METABOLIC PANEL - Abnormal; Notable for the following components:      Result Value   Sodium 132 (*)    Glucose, Bld 201 (*)    Albumin 3.3 (*)    All other components within normal limits  CBC WITH DIFFERENTIAL/PLATELET - Abnormal; Notable for the following components:   WBC 16.4 (*)    Neutro Abs 13.4 (*)    Abs Immature Granulocytes 0.08 (*)    All other components within normal limits  RESP PANEL BY RT-PCR (FLU A&B, COVID) ARPGX2  CULTURE, BLOOD (ROUTINE X 2)  CULTURE, BLOOD (ROUTINE X 2)  LACTIC ACID, PLASMA  PROCALCITONIN  URINALYSIS, ROUTINE W REFLEX MICROSCOPIC  TROPONIN I (HIGH SENSITIVITY)   ____________________________________________  EKG   EKG Interpretation  Date/Time:  Saturday September 15 2021 17:58:43 EDT Ventricular Rate:  107 PR Interval:  140 QRS Duration: 76 QT Interval:  310 QTC Calculation: 413 R Axis:   98 Text Interpretation: Sinus tachycardia Rightward axis T wave abnormality, consider inferior ischemia Abnormal ECG Confirmed by Pryor Curia [phone]) on 09/16/2021 12:16:57 AM        ____________________________________________  RADIOLOGY Jessie Foot Siddhanth Denk, personally viewed and evaluated these images  (plain radiographs) as part of my medical decision making, as well as reviewing the written report by the radiologist.  ED MD interpretation: Chest x-ray shows no acute abnormality.  CT shows no PE but shows multifocal pneumonia.  Official radiology report(s): DG Chest 2  View  Result Date: 09/15/2021 CLINICAL DATA:  Shortness of breath x1 month. EXAM: CHEST - 2 VIEW COMPARISON:  June 16, 2021 FINDINGS: The heart size and mediastinal contours are within normal limits. Both lungs are hyperinflated and otherwise clear. The visualized skeletal structures are unremarkable. IMPRESSION: No active cardiopulmonary disease. Electronically Signed   By: Virgina Norfolk M.D.   On: 09/15/2021 18:23   CT Angio Chest PE W and/or Wo Contrast  Result Date: 09/16/2021 CLINICAL DATA:  PE suspected, high prob.  Dyspnea EXAM: CT ANGIOGRAPHY CHEST WITH CONTRAST TECHNIQUE: Multidetector CT imaging of the chest was performed using the standard protocol during bolus administration of intravenous contrast. Multiplanar CT image reconstructions and MIPs were obtained to evaluate the vascular anatomy. CONTRAST:  66mL OMNIPAQUE IOHEXOL 350 MG/ML SOLN COMPARISON:  06/16/2021 FINDINGS: Cardiovascular: Adequate opacification of the pulmonary arterial tree. No intraluminal filling defect identified to suggest acute pulmonary embolism. Central pulmonary arteries are of normal caliber. Mild coronary artery calcification. Global cardiac size within normal limits. No pericardial effusion. Mild atherosclerotic calcification within the thoracic aorta. No aortic aneurysm. Mediastinum/Nodes: No enlarged mediastinal, hilar, or axillary lymph nodes. Thyroid gland, trachea, and esophagus demonstrate no significant findings. Lungs/Pleura: Nodular centrilobular and peribronchovascular infiltrate and asymmetric peripheral airway impaction is seen within the lung bases bilaterally, significantly progressed since prior examination. Peripheral  peribronchovascular nodular infiltrate within the right upper lobe, right middle lobe, and superior segment of the right lower lobe laterally as well as the lingula also appears progressive since prior examination. The findings are most suggestive of multifocal pneumonia in the acute setting. Superimposed aspiration, however, is difficult to exclude. No pneumothorax or pleural effusion. There is progressive bronchial wall thickening in keeping with airway inflammation. No central obstructing lesion. Upper Abdomen: No acute abnormality. Musculoskeletal: No chest wall abnormality. No acute or significant osseous findings. Review of the MIP images confirms the above findings. IMPRESSION: No pulmonary embolism. Mild coronary artery calcification. Marked interval progression of peribronchovascular nodular pulmonary infiltrate within the lung periphery, asymmetrically more severe within the lung bases bilaterally. The findings are most suggestive of progressive multifocal pneumonia, though given the basilar predominance and airway impaction, superimposed aspiration is difficult to exclude. Aortic Atherosclerosis (ICD10-I70.0). Electronically Signed   By: Fidela Salisbury M.D.   On: 09/16/2021 01:50    ____________________________________________   PROCEDURES  Procedure(s) performed (including Critical Care):  Procedures  CRITICAL CARE Performed by: Cyril Mourning Oscar Hank   Total critical care time: 45 minutes  Critical care time was exclusive of separately billable procedures and treating other patients.  Critical care was necessary to treat or prevent imminent or life-threatening deterioration.  Critical care was time spent personally by me on the following activities: development of treatment plan with patient and/or surrogate as well as nursing, discussions with consultants, evaluation of patient's response to treatment, examination of patient, obtaining history from patient or surrogate, ordering and  performing treatments and interventions, ordering and review of laboratory studies, ordering and review of radiographic studies, pulse oximetry and re-evaluation of patient's condition.  ____________________________________________   INITIAL IMPRESSION / ASSESSMENT AND PLAN / ED COURSE  As part of my medical decision making, I reviewed the following data within the [place] History obtained from family, Nursing notes reviewed and incorporated, Labs reviewed , EKG interpreted , Old EKG reviewed, Radiograph reviewed , Discussed with admitting physician , CT reviewed, and Notes from prior ED visits         Patient here with COPD exacerbation/chronic bronchitis with  now fever for the past 2 days.  She has had multifocal pneumonia due to COVID-19 in July.  Has finished 2 rounds of antibiotics and a course of steroids without improvement.  Today she has a leukocytosis of 16,000.  Did have 1 episode of hypotension but this has improved on recheck.  Lactic is normal.  Will check procalcitonin.  Will obtain CTA of her chest to further evaluate for possible pneumonia but also to rule out PE, CHF.  Will give albuterol, Atrovent, Solu-Medrol for symptomatic relief.  COVID, flu swabs pending.  I suspect patient will likely need admission and she agrees.  ED PROGRESS  Patient's wheezing slowly improving.  CTA of the chest shows no PE but does show multifocal pneumonia.  She has multiple drug allergies.  She feels like cephalosporins helped her the most previously.  Will give IV cefepime.  Will discuss with hospitalist for admission.   2:55 AM Discussed patient's case with hospitalist, Dr. Damita Dunnings.  I have recommended admission and patient (and family if present) agree with this plan. Admitting physician will place admission orders.   I reviewed all nursing notes, vitals, pertinent previous records and reviewed/interpreted all EKGs, lab and urine results, imaging (as  available).  ____________________________________________   FINAL CLINICAL IMPRESSION(S) / ED DIAGNOSES  Final diagnoses:  COPD exacerbation ([place])  Multifocal pneumonia     ED Discharge Orders     None       *Please note:  TAJE TONDREAU was evaluated in Emergency Department on 09/16/2021 for the symptoms described in the history of present illness. She was evaluated in the context of the global COVID-19 pandemic, which necessitated consideration that the patient might be at risk for infection with the SARS-CoV-2 virus that causes COVID-19. Institutional protocols and algorithms that pertain to the evaluation of patients at risk for COVID-19 are in a state of rapid change based on information released by regulatory bodies including the CDC and federal and state organizations. These policies and algorithms were followed during the patient's care in the ED.  Some ED evaluations and interventions may be delayed as a result of limited staffing during and the pandemic.*   Note:  This document was prepared using Dragon voice recognition software and may include unintentional dictation errors.    Elleen Coulibaly, Delice Bison, DO 09/16/21 [phone]

## 2021-09-15 NOTE — ED Triage Notes (Signed)
Pt via POV from home. Pt c/o SOB for the past month, pt states it started when she had COVID, PNA, and bronchitis in the past month. Pt states that she noticed a fever last night and today. Pt states fever was high as 103. Pt states that she took Aleve approx 1 hour ago. Pt has a hx of asthma and COPD. Pt is A&Ox4 and NAD.

## 2021-09-16 ENCOUNTER — Encounter: Payer: Self-pay | Admitting: Internal Medicine

## 2021-09-16 ENCOUNTER — Emergency Department: Payer: Medicare PPO

## 2021-09-16 DIAGNOSIS — J441 Chronic obstructive pulmonary disease with (acute) exacerbation: Secondary | ICD-10-CM | POA: Diagnosis present

## 2021-09-16 DIAGNOSIS — Z20822 Contact with and (suspected) exposure to covid-19: Secondary | ICD-10-CM | POA: Diagnosis present

## 2021-09-16 DIAGNOSIS — G4733 Obstructive sleep apnea (adult) (pediatric): Secondary | ICD-10-CM | POA: Diagnosis present

## 2021-09-16 DIAGNOSIS — I10 Essential (primary) hypertension: Secondary | ICD-10-CM | POA: Diagnosis present

## 2021-09-16 DIAGNOSIS — H3530 Unspecified macular degeneration: Secondary | ICD-10-CM | POA: Diagnosis present

## 2021-09-16 DIAGNOSIS — H409 Unspecified glaucoma: Secondary | ICD-10-CM

## 2021-09-16 DIAGNOSIS — Z87891 Personal history of nicotine dependence: Secondary | ICD-10-CM | POA: Diagnosis not present

## 2021-09-16 DIAGNOSIS — Z8616 Personal history of COVID-19: Secondary | ICD-10-CM | POA: Diagnosis not present

## 2021-09-16 DIAGNOSIS — J189 Pneumonia, unspecified organism: Secondary | ICD-10-CM | POA: Diagnosis present

## 2021-09-16 DIAGNOSIS — Z90710 Acquired absence of both cervix and uterus: Secondary | ICD-10-CM | POA: Diagnosis not present

## 2021-09-16 DIAGNOSIS — Z880 Allergy status to penicillin: Secondary | ICD-10-CM | POA: Diagnosis not present

## 2021-09-16 DIAGNOSIS — Z881 Allergy status to other antibiotic agents status: Secondary | ICD-10-CM | POA: Diagnosis not present

## 2021-09-16 DIAGNOSIS — Z9189 Other specified personal risk factors, not elsewhere classified: Secondary | ICD-10-CM

## 2021-09-16 DIAGNOSIS — M810 Age-related osteoporosis without current pathological fracture: Secondary | ICD-10-CM | POA: Diagnosis present

## 2021-09-16 DIAGNOSIS — Z91018 Allergy to other foods: Secondary | ICD-10-CM | POA: Diagnosis not present

## 2021-09-16 DIAGNOSIS — K219 Gastro-esophageal reflux disease without esophagitis: Secondary | ICD-10-CM | POA: Diagnosis present

## 2021-09-16 DIAGNOSIS — Z882 Allergy status to sulfonamides status: Secondary | ICD-10-CM | POA: Diagnosis not present

## 2021-09-16 DIAGNOSIS — E785 Hyperlipidemia, unspecified: Secondary | ICD-10-CM | POA: Diagnosis present

## 2021-09-16 DIAGNOSIS — B338 Other specified viral diseases: Secondary | ICD-10-CM | POA: Diagnosis not present

## 2021-09-16 DIAGNOSIS — Z8249 Family history of ischemic heart disease and other diseases of the circulatory system: Secondary | ICD-10-CM | POA: Diagnosis not present

## 2021-09-16 DIAGNOSIS — Z7983 Long term (current) use of bisphosphonates: Secondary | ICD-10-CM | POA: Diagnosis not present

## 2021-09-16 DIAGNOSIS — B974 Respiratory syncytial virus as the cause of diseases classified elsewhere: Secondary | ICD-10-CM | POA: Diagnosis present

## 2021-09-16 DIAGNOSIS — J209 Acute bronchitis, unspecified: Secondary | ICD-10-CM | POA: Diagnosis present

## 2021-09-16 DIAGNOSIS — Z825 Family history of asthma and other chronic lower respiratory diseases: Secondary | ICD-10-CM | POA: Diagnosis not present

## 2021-09-16 DIAGNOSIS — Z888 Allergy status to other drugs, medicaments and biological substances status: Secondary | ICD-10-CM | POA: Diagnosis not present

## 2021-09-16 DIAGNOSIS — Z7951 Long term (current) use of inhaled steroids: Secondary | ICD-10-CM | POA: Diagnosis not present

## 2021-09-16 LAB — URINALYSIS, ROUTINE W REFLEX MICROSCOPIC
Bilirubin Urine: NEGATIVE
Glucose, UA: NEGATIVE mg/dL
Hgb urine dipstick: NEGATIVE
Ketones, ur: NEGATIVE mg/dL
Leukocytes,Ua: NEGATIVE
Nitrite: NEGATIVE
Protein, ur: NEGATIVE mg/dL
Specific Gravity, Urine: 1.021 (ref 1.005–1.030)
pH: 6 (ref 5.0–8.0)

## 2021-09-16 LAB — RESPIRATORY PANEL BY PCR

## 2021-09-16 LAB — CBC
HCT: 36.2 % (ref 36.0–46.0)
Hemoglobin: 12.4 g/dL (ref 12.0–15.0)
MCH: 29.6 pg (ref 26.0–34.0)
MCHC: 34.3 g/dL (ref 30.0–36.0)
MCV: 86.4 fL (ref 80.0–100.0)
Platelets: 288 10*3/uL (ref 150–400)
RBC: 4.19 MIL/uL (ref 3.87–5.11)
RDW: 13.5 % (ref 11.5–15.5)
WBC: 14.4 10*3/uL — ABNORMAL HIGH (ref 4.0–10.5)
nRBC: 0 % (ref 0.0–0.2)

## 2021-09-16 LAB — CREATININE, SERUM
Creatinine, Ser: 1 mg/dL (ref 0.44–1.00)
GFR, Estimated: 59 mL/min — ABNORMAL LOW (ref >60)

## 2021-09-16 LAB — RESP PANEL BY RT-PCR (FLU A&B, COVID) ARPGX2
Influenza A by PCR: NEGATIVE
Influenza B by PCR: NEGATIVE
SARS Coronavirus 2 by RT PCR: NEGATIVE

## 2021-09-16 LAB — PROCALCITONIN: Procalcitonin: <0.1 ng/mL

## 2021-09-16 LAB — TROPONIN I (HIGH SENSITIVITY): Troponin I (High Sensitivity): 3 ng/L (ref <18)

## 2021-09-16 LAB — HIV ANTIBODY (ROUTINE TESTING W REFLEX): HIV Screen 4th Generation wRfx: NONREACTIVE

## 2021-09-16 MED ORDER — METHYLPREDNISOLONE SODIUM SUCC 40 MG IJ SOLR
40.0000 mg | Freq: Two times a day (BID) | INTRAMUSCULAR | Status: AC
  Administered 2021-09-16 – 2021-09-17 (×2): 40 mg via INTRAVENOUS
  Filled 2021-09-16 (×2): qty 1

## 2021-09-16 MED ORDER — POLYSACCHARIDE IRON COMPLEX 150 MG PO CAPS
150.0000 mg | ORAL_CAPSULE | ORAL | Status: DC
  Administered 2021-09-17: 150 mg via ORAL
  Filled 2021-09-16: qty 1

## 2021-09-16 MED ORDER — ALBUTEROL SULFATE (2.5 MG/3ML) 0.083% IN NEBU: 2.5000 mg | INHALATION_SOLUTION | RESPIRATORY_TRACT | Status: DC | PRN

## 2021-09-16 MED ORDER — PREDNISONE 20 MG PO TABS: 40.0000 mg | ORAL_TABLET | Freq: Every day | ORAL | Status: DC

## 2021-09-16 MED ORDER — IPRATROPIUM-ALBUTEROL 0.5-2.5 (3) MG/3ML IN SOLN
3.0000 mL | Freq: Once | RESPIRATORY_TRACT | Status: AC
  Administered 2021-09-16: 3 mL via RESPIRATORY_TRACT
  Filled 2021-09-16: qty 3

## 2021-09-16 MED ORDER — ENOXAPARIN SODIUM 40 MG/0.4ML IJ SOSY
40.0000 mg | PREFILLED_SYRINGE | INTRAMUSCULAR | Status: DC
  Administered 2021-09-16 – 2021-09-18 (×3): 40 mg via SUBCUTANEOUS
  Filled 2021-09-16 (×3): qty 0.4

## 2021-09-16 MED ORDER — SODIUM CHLORIDE 0.9 % IV SOLN
1.0000 g | INTRAVENOUS | Status: DC
  Administered 2021-09-16 – 2021-09-18 (×3): 1 g via INTRAVENOUS
  Filled 2021-09-16 (×3): qty 10

## 2021-09-16 MED ORDER — SODIUM CHLORIDE 0.9 % IV SOLN
2.0000 g | Freq: Once | INTRAVENOUS | Status: AC
  Administered 2021-09-16: 2 g via INTRAVENOUS
  Filled 2021-09-16: qty 2

## 2021-09-16 MED ORDER — MONTELUKAST SODIUM 10 MG PO TABS
10.0000 mg | ORAL_TABLET | Freq: Every day | ORAL | Status: DC
  Administered 2021-09-17: 10 mg via ORAL
  Filled 2021-09-16 (×2): qty 1

## 2021-09-16 MED ORDER — CITALOPRAM HYDROBROMIDE 20 MG PO TABS
10.0000 mg | ORAL_TABLET | Freq: Every day | ORAL | Status: DC
  Administered 2021-09-16 – 2021-09-17 (×2): 10 mg via ORAL
  Filled 2021-09-16 (×2): qty 1

## 2021-09-16 MED ORDER — DORZOLAMIDE HCL-TIMOLOL MAL 2-0.5 % OP SOLN
1.0000 [drp] | Freq: Two times a day (BID) | OPHTHALMIC | Status: DC
  Administered 2021-09-17 – 2021-09-18 (×3): 1 [drp] via OPHTHALMIC
  Filled 2021-09-16: qty 10

## 2021-09-16 MED ORDER — AMLODIPINE BESYLATE 5 MG PO TABS
2.5000 mg | ORAL_TABLET | Freq: Every day | ORAL | Status: DC
  Administered 2021-09-17 – 2021-09-18 (×2): 2.5 mg via ORAL
  Filled 2021-09-16 (×3): qty 1

## 2021-09-16 MED ORDER — EZETIMIBE 10 MG PO TABS
10.0000 mg | ORAL_TABLET | Freq: Every day | ORAL | Status: DC
  Administered 2021-09-16 – 2021-09-17 (×2): 10 mg via ORAL
  Filled 2021-09-16 (×3): qty 1

## 2021-09-16 MED ORDER — MELOXICAM 7.5 MG PO TABS
7.5000 mg | ORAL_TABLET | Freq: Every day | ORAL | Status: DC | PRN
  Filled 2021-09-16: qty 1

## 2021-09-16 MED ORDER — ALBUTEROL SULFATE HFA 108 (90 BASE) MCG/ACT IN AERS: 2.0000 | INHALATION_SPRAY | Freq: Four times a day (QID) | RESPIRATORY_TRACT | Status: DC | PRN

## 2021-09-16 MED ORDER — LOSARTAN POTASSIUM 50 MG PO TABS
50.0000 mg | ORAL_TABLET | Freq: Every day | ORAL | Status: DC
  Administered 2021-09-16 – 2021-09-17 (×2): 50 mg via ORAL
  Filled 2021-09-16 (×2): qty 1

## 2021-09-16 MED ORDER — LATANOPROST 0.005 % OP SOLN
1.0000 [drp] | Freq: Every day | OPHTHALMIC | Status: DC
  Administered 2021-09-17: 1 [drp] via OPHTHALMIC
  Filled 2021-09-16: qty 2.5

## 2021-09-16 MED ORDER — MOMETASONE FURO-FORMOTEROL FUM 200-5 MCG/ACT IN AERO
2.0000 | INHALATION_SPRAY | Freq: Two times a day (BID) | RESPIRATORY_TRACT | Status: DC
  Administered 2021-09-17 – 2021-09-18 (×3): 2 via RESPIRATORY_TRACT
  Filled 2021-09-16: qty 8.8

## 2021-09-16 MED ORDER — IPRATROPIUM-ALBUTEROL 0.5-2.5 (3) MG/3ML IN SOLN
3.0000 mL | Freq: Four times a day (QID) | RESPIRATORY_TRACT | Status: DC
  Administered 2021-09-16 – 2021-09-17 (×7): 3 mL via RESPIRATORY_TRACT
  Filled 2021-09-16 (×7): qty 3

## 2021-09-16 MED ORDER — IOHEXOL 350 MG/ML SOLN
75.0000 mL | Freq: Once | INTRAVENOUS | Status: AC | PRN
  Administered 2021-09-16: 75 mL via INTRAVENOUS

## 2021-09-16 MED ORDER — LACTATED RINGERS IV BOLUS
1000.0000 mL | Freq: Once | INTRAVENOUS | Status: AC
  Administered 2021-09-16: 1000 mL via INTRAVENOUS

## 2021-09-16 MED ORDER — IPRATROPIUM-ALBUTEROL 0.5-2.5 (3) MG/3ML IN SOLN: 3.0000 mL | Freq: Four times a day (QID) | RESPIRATORY_TRACT | Status: DC | PRN

## 2021-09-16 MED ORDER — PANTOPRAZOLE SODIUM 40 MG PO TBEC
40.0000 mg | DELAYED_RELEASE_TABLET | Freq: Every day | ORAL | Status: DC
  Administered 2021-09-16 – 2021-09-18 (×3): 40 mg via ORAL
  Filled 2021-09-16 (×3): qty 1

## 2021-09-16 NOTE — Progress Notes (Signed)
Patient seen and examined at the bedside.  She complains of cough.  Breathing is better this morning.  She is not wheezing anymore.  Vital signs are stable.  Start IV Rocephin for multifocal pneumonia.  She said she completed a course of Ceftin in August 2022 and completed a course of doxycycline about 2 weeks prior to admission.  Plan of care was discussed with the patient and [name], daughter, at the bedside.

## 2021-09-16 NOTE — ED Notes (Signed)
Patient ambulatory to use restroom.  Provided with additional pads and underwear per request.  No c/o at this time

## 2021-09-16 NOTE — H&P (Signed)
History and Physical    Sonya Gaines MHD:[id number] DOB: 1946-05-18 DOA: 09/15/2021  PCP: Tracie Harrier, MD   Patient coming from: home  I have personally briefly reviewed patient's relevant medical records in [place]  Chief Complaint: cough, fever, weakness  HPI: Sonya Gaines is a 75 y.o. female with medical history significant for COPD, remote ex-smoker, chronic sinusitis and chronic cough followed by pulmonology most recently seen on 10/21 and treated for bronchitis with prednisone and doxycycline with documentation of positive COVID on 10/20 who presents to the ED with persistent coughing not responding to outpatient management.  Patient was treated back in July 2022 for COVID pneumonia and has had repeated episodes of bronchitis since completing Ceftin in August and doxycycline most recently in October..  FEV1 8/31 unchanged per pulmonology note.  Patient has chest tightness associated with coughing.  She denies nausea or vomiting or palpitations or lightheadedness.  She denies lower extremity pain or swelling.  Patient was at her new baseline however over the past 3 days she has started having fevers to as high as 103 associated with generalized malaise and decided to come into the emergency room for evaluation.  ED course: Vitals with temp 99.4, pulse 116 and respirations 25 with otherwise normal vitals Blood work with WBC 16,000 with lactic acid 1.4 and procalcitonin less than 0.10.  Troponin 3 COVID and flu negative  EKG, personally viewed and interpreted: Sinus tachycardia at 107 with no acute ST-T wave changes  CTA chest: No PE but findings suggestive of progressive multifocal pneumonia with possible superimposed aspiration  Patient treated with duo nebs and Solu-Medrol and started on cefepime for possible bacterial pneumonia.  Hospitalist consulted for admission.    Review of Systems: As per HPI otherwise all other systems on review of systems negative.     Past Medical History:  Diagnosis Date   Asthma    Slight   COPD (chronic obstructive pulmonary disease) (HCC)    GERD (gastroesophageal reflux disease)    Glaucoma    Heart murmur    Hyperlipidemia    Hypertension    Macular degeneration    OSA (obstructive sleep apnea)    not on CPAP    Osteoporosis     Past Surgical History:  Procedure Laterality Date   ABDOMINAL HYSTERECTOMY     BREAST CYST ASPIRATION Right YRS AGO   COLONOSCOPY WITH PROPOFOL N/A 07/14/2019   Procedure: COLONOSCOPY WITH PROPOFOL;  Surgeon: Toledo, Benay Pike, MD;  Location: [facility] ENDOSCOPY;  Service: Endoscopy;  Laterality: N/A;   GLAUCOMA SURGERY     x 2    HIP PINNING,CANNULATED Right 03/12/2017   Procedure: CANNULATED HIP PINNING;  Surgeon: Thornton Park, MD;  Location: [facility] ORS;  Service: Orthopedics;  Laterality: Right;   NASAL SINUS SURGERY     x 3    VESICOVAGINAL FISTULA CLOSURE W/ TAH       reports that she quit smoking about 29 years ago. Her smoking use included cigarettes. She has a 45.00 pack-year smoking history. She has never used smokeless tobacco. She reports that she does not drink alcohol and does not use drugs.  Allergies  Allergen Reactions   Apraclonidine Rash   Cheese Other (See Comments)    Sinus congestion   Chocolate Flavor Other (See Comments)    Sinus congestion   Lac Bovis Other (See Comments)    Sinus congestion    Brimonidine Rash   Clarithromycin Rash   Clindamycin/Lincomycin Swelling and Rash  Levofloxacin Rash   Nickel Rash   Penicillins Rash   Polytrim [Polymyxin B-Trimethoprim] Rash   Statins Rash   Sulfamethoxazole Rash   Sulfonamide Derivatives Rash   Tobramycin Rash    Family History  Problem Relation Age of Onset   Emphysema Father        smoked   Heart disease Father    Heart disease Brother    Breast cancer Maternal Aunt    Lung cancer Paternal Grandfather        never smoker   Lung cancer Son        never smoker      Prior to  Admission medications   Medication Sig Start Date End Date Taking? Authorizing Provider  albuterol (VENTOLIN HFA) 108 (90 Base) MCG/ACT inhaler Inhale 2 puffs into the lungs every 6 (six) hours as needed for wheezing or shortness of breath.    [provider]  alendronate (FOSAMAX) 70 MG tablet Take 70 mg by mouth once a week. Take with a full glass of water on an empty stomach.    [provider]  budesonide-formoterol (SYMBICORT) 160-4.5 MCG/ACT inhaler Inhale 2 puffs into the lungs 2 (two) times daily.    [provider]  calcium-vitamin D (SM CALCIUM 500/VITAMIN D3) 500-400 MG-UNIT tablet Take 1 tablet by mouth at bedtime.    [provider]  dorzolamide-timolol (COSOPT) 22.3-6.8 MG/ML ophthalmic solution Place 1 drop into the right eye 2 (two) times daily.     [provider]  doxycycline (PERIOSTAT) 20 MG tablet Take 20 mg by mouth 2 (two) times daily.    [provider]  enoxaparin (LOVENOX) 40 MG/0.4ML injection Inject 0.4 mLs (40 mg total) into the skin daily. 03/14/17 03/26/17  Vaughan Basta, MD  esomeprazole (NEXIUM) 40 MG capsule Take 40 mg by mouth daily. 11/05/16   [provider]  ezetimibe (ZETIA) 10 MG tablet Take 10 mg by mouth daily.    [provider]  hydrALAZINE (APRESOLINE) 10 MG tablet Take 1 tablet (10 mg total) by mouth 2 (two) times daily. 02/01/19 02/01/20  Rudene Re, MD  hydrALAZINE (APRESOLINE) 10 MG tablet Take 10 mg by mouth 2 (two) times daily.    [provider]  latanoprost (XALATAN) 0.005 % ophthalmic solution Place 1 drop into the right eye at bedtime.     [provider]  Lifitegrast [place] Friar) 5 % SOLN Apply 1 drop to eye 2 (two) times daily. Shirley Friar    [provider]  losartan (COZAAR) 50 MG tablet Take 50 mg by mouth daily.    [provider]  loteprednol (LOTEMAX) 0.5 % ophthalmic suspension Place 1 drop into both eyes at bedtime.     [provider]  magnesium citrate SOLN Take 296 mLs (1 Bottle total) by mouth once as needed for severe constipation. 03/14/17   Vaughan Basta, MD  montelukast (SINGULAIR) 10 MG tablet Take 10 mg by mouth at bedtime.    [provider]  Multiple Vitamins-Minerals (PRESERVISION AREDS) TABS Take 1 tablet by mouth 2 (two) times daily.     [provider]  Multiple Vitamins-Minerals (WOMENS 50+ MULTI VITAMIN/MIN) TABS Take 1 tablet by mouth daily.    [provider]  Netarsudil Dimesylate (RHOPRESSA) 0.02 % SOLN Place 1 drop into the right eye at bedtime.    [provider]  oxyCODONE (OXY IR/ROXICODONE) 5 MG immediate release tablet Take 1 tablet (5 mg total) by mouth every 6 (six) hours as needed for severe  pain or breakthrough pain ((for MODERATE breakthrough pain)). 03/14/17   Vaughan Basta, MD  predniSONE (STERAPRED UNI-PAK 21 TAB) 10 MG (21) TBPK tablet Take 6 pills on day one then decrease by 1 pill each day 06/07/21   Versie Starks, PA-C  senna-docusate (SENOKOT-S) 8.6-50 MG tablet Take 1 tablet by mouth at bedtime as needed for mild constipation. 03/14/17   Vaughan Basta, MD    Physical Exam: Vitals:   09/15/21 2354 09/16/21 0000 09/16/21 0030 09/16/21 0100  BP: (!) 112/57 129/78 (!) 111/52 116/60  Pulse: 74 79 71 69  Resp: (!) 21 (!) 26 13 15   Temp:      TempSrc:      SpO2: 98% 99% 100% 98%  Weight:      Height:       Constitutional: Alert and oriented x 3 . Not in any apparent distress HEENT:      Head: Normocephalic and atraumatic.         Eyes: PERLA, EOMI, Conjunctivae are normal. Sclera is non-icteric.       Mouth/Throat: Mucous membranes are moist.       Neck: Supple with no signs of meningismus. Cardiovascular: Regular rate and rhythm. No murmurs, gallops, or rubs. 2+ symmetrical distal pulses are present . No JVD. No  LE edema Respiratory: Respiratory effort normal .Lungs sounds diminished at bases with  few scattered wheezes  gastrointestinal: Soft, non tender, non distended. Positive bowel sounds.  Genitourinary: No CVA tenderness. Musculoskeletal: Nontender with normal range of motion in all extremities. No cyanosis, or erythema of extremities. Neurologic:  Face is symmetric. Moving all extremities. No gross focal neurologic deficits . Skin: Skin is warm, dry.  No rash or ulcers Psychiatric: Mood and affect are appropriate    Labs on Admission: I have personally reviewed following labs and imaging studies  CBC: Recent Labs  Lab 09/15/21 1807  WBC 16.4*  NEUTROABS 13.4*  HGB 12.6  HCT 36.4  MCV 86.7  PLT 852   Basic Metabolic Panel: Recent Labs  Lab 09/15/21 1807  NA 132*  K 4.0  CL 99  CO2 23  GLUCOSE 201*  BUN 13  CREATININE 0.93  CALCIUM 9.1   GFR: Estimated Creatinine Clearance: 42 mL/min (by C-G formula based on SCr of 0.93 mg/dL). Liver Function Tests: Recent Labs  Lab 09/15/21 1807  AST 19  ALT 15  ALKPHOS 67  BILITOT 0.7  PROT 6.9  ALBUMIN 3.3*   No results for input(s): LIPASE, AMYLASE in the last 168 hours. No results for input(s): AMMONIA in the last 168 hours. Coagulation Profile: No results for input(s): INR, PROTIME in the last 168 hours. Cardiac Enzymes: No results for input(s): CKTOTAL, CKMB, CKMBINDEX, TROPONINI in the last 168 hours. BNP (last 3 results) No results for input(s): PROBNP in the last 8760 hours. HbA1C: No results for input(s): HGBA1C in the last 72 hours. CBG: No results for input(s): GLUCAP in the last 168 hours. Lipid Profile: No results for input(s): CHOL, HDL, LDLCALC, TRIG, CHOLHDL, LDLDIRECT in the last 72 hours. Thyroid Function Tests: No results for input(s): TSH, T4TOTAL, FREET4, T3FREE, THYROIDAB in the last 72 hours. Anemia Panel: No results for input(s): VITAMINB12, FOLATE, FERRITIN, TIBC, IRON, RETICCTPCT in the last 72 hours. Urine analysis: No results found for: COLORURINE, APPEARANCEUR, [place],  [place], GLUCOSEU, HGBUR, BILIRUBINUR, KETONESUR, PROTEINUR, UROBILINOGEN, NITRITE, LEUKOCYTESUR  Radiological Exams on Admission: DG Chest 2 View  Result Date: 09/15/2021 CLINICAL DATA:  Shortness of breath x1 month. EXAM: CHEST -  2 VIEW COMPARISON:  June 16, 2021 FINDINGS: The heart size and mediastinal contours are within normal limits. Both lungs are hyperinflated and otherwise clear. The visualized skeletal structures are unremarkable. IMPRESSION: No active cardiopulmonary disease. Electronically Signed   By: Virgina Norfolk M.D.   On: 09/15/2021 18:23   CT Angio Chest PE W and/or Wo Contrast  Result Date: 09/16/2021 CLINICAL DATA:  PE suspected, high prob.  Dyspnea EXAM: CT ANGIOGRAPHY CHEST WITH CONTRAST TECHNIQUE: Multidetector CT imaging of the chest was performed using the standard protocol during bolus administration of intravenous contrast. Multiplanar CT image reconstructions and MIPs were obtained to evaluate the vascular anatomy. CONTRAST:  52mL OMNIPAQUE IOHEXOL 350 MG/ML SOLN COMPARISON:  06/16/2021 FINDINGS: Cardiovascular: Adequate opacification of the pulmonary arterial tree. No intraluminal filling defect identified to suggest acute pulmonary embolism. Central pulmonary arteries are of normal caliber. Mild coronary artery calcification. Global cardiac size within normal limits. No pericardial effusion. Mild atherosclerotic calcification within the thoracic aorta. No aortic aneurysm. Mediastinum/Nodes: No enlarged mediastinal, hilar, or axillary lymph nodes. Thyroid gland, trachea, and esophagus demonstrate no significant findings. Lungs/Pleura: Nodular centrilobular and peribronchovascular infiltrate and asymmetric peripheral airway impaction is seen within the lung bases bilaterally, significantly progressed since prior examination. Peripheral peribronchovascular nodular infiltrate within the right upper lobe, right middle lobe, and superior segment of the right lower lobe  laterally as well as the lingula also appears progressive since prior examination. The findings are most suggestive of multifocal pneumonia in the acute setting. Superimposed aspiration, however, is difficult to exclude. No pneumothorax or pleural effusion. There is progressive bronchial wall thickening in keeping with airway inflammation. No central obstructing lesion. Upper Abdomen: No acute abnormality. Musculoskeletal: No chest wall abnormality. No acute or significant osseous findings. Review of the MIP images confirms the above findings. IMPRESSION: No pulmonary embolism. Mild coronary artery calcification. Marked interval progression of peribronchovascular nodular pulmonary infiltrate within the lung periphery, asymmetrically more severe within the lung bases bilaterally. The findings are most suggestive of progressive multifocal pneumonia, though given the basilar predominance and airway impaction, superimposed aspiration is difficult to exclude. Aortic Atherosclerosis (ICD10-I70.0). Electronically Signed   By: Fidela Salisbury M.D.   On: 09/16/2021 01:50    Assessment/Plan    Multifocal pneumonia   COPD with acute Bronchitis    History of COVID pneumonia 05/2021 Patient with persistent cough and wheezing not responding to outpatient antibiotic and steroid , now associated with fevers - CTA chest negative for PE but showing possibility of microaspiration - WBC 16,000, lactic acid normal, Pro-Calc less than 0.1 suggesting nonbacterial etiology. Covid and flu negative -Follow respiratory viral panel - Scheduled and as needed bronchodilator treatments - Methylprednisolone - Patient received cefepime in the ED.  Will hold off on further antibiotics given several allergies and Pro-Calc less than 0.1 - Consider pulmonology consult/infectious diseases consult - Treat acid reflux, given findings on CTA consistent with aspiration  Multiple medication allergies - Patient  patient allergic to several  medications and foods - Monitor for allergic reactions    Essential (primary) hypertension - Continue losartan    GERD (gastroesophageal reflux disease) - Protonix    Glaucoma - Continue home eyedrops    DVT prophylaxis: Lovenox  Code Status: full code  Family Communication:  none  Disposition Plan: Back to previous home environment Consults called: none  Status:At the time of admission, it appears that the appropriate admission status for this patient is INPATIENT. This is judged to be reasonable and necessary in order to  provide the required intensity of service to ensure the patient's safety given the presenting symptoms, physical exam findings, and initial radiographic and laboratory data in the context of their  Comorbid conditions.   Patient requires inpatient status due to high intensity of service, high risk for further deterioration and high frequency of surveillance required.   I certify that at the point of admission it is my clinical judgment that the patient will require inpatient hospital care spanning beyond [place] MD [facility]   09/16/2021, 3:18 AM

## 2021-09-17 ENCOUNTER — Encounter: Payer: Self-pay | Admitting: Internal Medicine

## 2021-09-17 DIAGNOSIS — J441 Chronic obstructive pulmonary disease with (acute) exacerbation: Secondary | ICD-10-CM

## 2021-09-17 LAB — CBC WITH DIFFERENTIAL/PLATELET
Abs Immature Granulocytes: 0.12 10*3/uL — ABNORMAL HIGH (ref 0.00–0.07)
Basophils Absolute: 0 10*3/uL (ref 0.0–0.1)
Basophils Relative: 0 %
Eosinophils Absolute: 0 10*3/uL (ref 0.0–0.5)
Eosinophils Relative: 0 %
HCT: 32.8 % — ABNORMAL LOW (ref 36.0–46.0)
Hemoglobin: 11.3 g/dL — ABNORMAL LOW (ref 12.0–15.0)
Immature Granulocytes: 1 %
Lymphocytes Relative: 6 %
Lymphs Abs: 0.9 10*3/uL (ref 0.7–4.0)
MCH: 29.7 pg (ref 26.0–34.0)
MCHC: 34.5 g/dL (ref 30.0–36.0)
MCV: 86.3 fL (ref 80.0–100.0)
Monocytes Absolute: 0.3 10*3/uL (ref 0.1–1.0)
Monocytes Relative: 2 %
Neutro Abs: 14.9 10*3/uL — ABNORMAL HIGH (ref 1.7–7.7)
Neutrophils Relative %: 91 %
Platelets: 268 10*3/uL (ref 150–400)
RBC: 3.8 MIL/uL — ABNORMAL LOW (ref 3.87–5.11)
RDW: 13.3 % (ref 11.5–15.5)
WBC: 16.2 10*3/uL — ABNORMAL HIGH (ref 4.0–10.5)
nRBC: 0 % (ref 0.0–0.2)

## 2021-09-17 LAB — BASIC METABOLIC PANEL
Anion gap: 6 (ref 5–15)
BUN: 20 mg/dL (ref 8–23)
CO2: 26 mmol/L (ref 22–32)
Calcium: 9.1 mg/dL (ref 8.9–10.3)
Chloride: 104 mmol/L (ref 98–111)
Creatinine, Ser: 0.62 mg/dL (ref 0.44–1.00)
GFR, Estimated: >60 mL/min (ref >60)
Glucose, Bld: 162 mg/dL — ABNORMAL HIGH (ref 70–99)
Potassium: 4.2 mmol/L (ref 3.5–5.1)
Sodium: 136 mmol/L (ref 135–145)

## 2021-09-17 LAB — EXPECTORATED SPUTUM ASSESSMENT W GRAM STAIN, RFLX TO RESP C

## 2021-09-17 LAB — RESP PANEL BY RT-PCR (FLU A&B, COVID) ARPGX2
Influenza A by PCR: NEGATIVE
Influenza B by PCR: NEGATIVE
SARS Coronavirus 2 by RT PCR: NEGATIVE

## 2021-09-17 MED ORDER — IPRATROPIUM-ALBUTEROL 0.5-2.5 (3) MG/3ML IN SOLN
3.0000 mL | Freq: Two times a day (BID) | RESPIRATORY_TRACT | Status: DC
  Administered 2021-09-18: 3 mL via RESPIRATORY_TRACT
  Filled 2021-09-17: qty 3

## 2021-09-17 MED ORDER — PREDNISONE 20 MG PO TABS: 40.0000 mg | ORAL_TABLET | Freq: Every day | ORAL | Status: DC

## 2021-09-17 MED ORDER — GUAIFENESIN-DM 100-10 MG/5ML PO SYRP
5.0000 mL | ORAL_SOLUTION | ORAL | Status: DC | PRN
  Administered 2021-09-17 – 2021-09-18 (×2): 5 mL via ORAL
  Filled 2021-09-17 (×2): qty 5

## 2021-09-17 NOTE — Progress Notes (Signed)
Progress Note    Sonya Gaines  HEN:[id number] DOB: Aug 29, 1946  DOA: 09/15/2021 PCP: Tracie Harrier, MD      Brief Narrative:    Medical records reviewed and are as summarized below:  Sonya Gaines is a 75 y.o. female with medical history significant for COPD, remote history of tobacco use disorder, chronic sinusitis, chronic cough, history of COVID-19 infection in July 2022, recent treatment with Ceftin in August 2022 and treatment with doxycycline about 2 weeks prior to admission for lung infection.  She presented to the hospital because of shortness of breath, cough, fever and generalized weakness.      Assessment/Plan:   Principal Problem:   Multifocal pneumonia Active Problems:   GERD (gastroesophageal reflux disease)   Essential (primary) hypertension   COPD with acute exacerbation (HCC)   Glaucoma   At risk for allergic reaction to medication    Body mass index is 24.99 kg/m.   RSV infection, multifocal pneumonia: Difficult to exclude bacterial pneumonia.  Continue IV Rocephin.  Sputum gram stain showed gram-positive cocci in pairs and chains and few gram-negative rods.  Follow-up sputum cultures.  No growth on blood cultures thus far.  COPD exacerbation: Continue prednisone and bronchodilators  Other comorbidities include allergy to multiple medications, hypertension, glaucoma, GERD,   Diet Order             Diet Heart Room service appropriate? Yes; Fluid consistency: Thin  Diet effective now                      Consultants: None  Procedures: None    Medications:    amLODipine  2.5 mg Oral Daily   citalopram  10 mg Oral QHS   dorzolamide-timolol  1 drop Right Eye BID   enoxaparin (LOVENOX) injection  40 mg Subcutaneous Q24H   ezetimibe  10 mg Oral QHS   ipratropium-albuterol  3 mL Nebulization Q6H   iron polysaccharides  150 mg Oral QODAY   latanoprost  1 drop Right Eye QHS   losartan  50 mg Oral QHS    mometasone-formoterol  2 puff Inhalation BID   montelukast  10 mg Oral QHS   pantoprazole  40 mg Oral Daily   Continuous Infusions:  cefTRIAXone (ROCEPHIN)  IV 1 g (09/17/21 1136)     Anti-infectives (From admission, onward)    Start     Dose/Rate Route Frequency Ordered Stop   09/16/21 0900  cefTRIAXone (ROCEPHIN) 1 g in sodium chloride 0.9 % 100 mL IVPB        1 g 200 mL/hr over 30 Minutes Intravenous Every 24 hours 09/16/21 0847     09/16/21 0245  ceFEPIme (MAXIPIME) 2 g in sodium chloride 0.9 % 100 mL IVPB        2 g 200 mL/hr over 30 Minutes Intravenous  Once 09/16/21 0230 09/16/21 0602              Family Communication/Anticipated D/C date and plan/Code Status   DVT prophylaxis: enoxaparin (LOVENOX) injection 40 mg Start: 09/16/21 0800     Code Status: Full Code  Family Communication: [name], daughter, at the bedside Disposition Plan: Plan to discharge home tomorrow   Status is: Inpatient  Remains inpatient appropriate because: Shortness of breath           Subjective:   C/o frequent cough, generalized weakness, shortness of breath  Objective:    Vitals:   09/17/21 0431 09/17/21 0746 09/17/21 0924 09/17/21  1359  BP: 129/73  136/79   Pulse: 91 89 83 77  Resp: 20 18  18   Temp: 97.8 F (36.6 C)  98 F (36.7 C)   TempSrc: Oral  Oral   SpO2: 97% 98% 98% 97%  Weight:      Height:       No data found.   Intake/Output Summary (Last 24 hours) at 09/17/2021 1432 Last data filed at 09/17/2021 0520 Gross per 24 hour  Intake --  Output 700 ml  Net -700 ml   Filed Weights   09/15/21 1752 09/17/21 0006  Weight: 59 kg 60 kg    Exam:  GEN: NAD SKIN: No rash EYES: EOMI ENT: MMM CV: RRR PULM: b/l wheezing. No rales heard ABD: soft, ND, NT, +BS CNS: AAO x 3, non focal EXT: No edema or tenderness        Data Reviewed:   I have personally reviewed following labs and imaging studies:  Labs: Labs show the following:   Basic  Metabolic Panel: Recent Labs  Lab 09/15/21 1807 09/16/21 0656 09/17/21 0534  NA 132*  --  136  K 4.0  --  4.2  CL 99  --  104  CO2 23  --  26  GLUCOSE 201*  --  162*  BUN 13  --  20  CREATININE 0.93 1.00 0.62  CALCIUM 9.1  --  9.1   GFR Estimated Creatinine Clearance: 50.5 mL/min (by C-G formula based on SCr of 0.62 mg/dL). Liver Function Tests: Recent Labs  Lab 09/15/21 1807  AST 19  ALT 15  ALKPHOS 67  BILITOT 0.7  PROT 6.9  ALBUMIN 3.3*   No results for input(s): LIPASE, AMYLASE in the last 168 hours. No results for input(s): AMMONIA in the last 168 hours. Coagulation profile No results for input(s): INR, PROTIME in the last 168 hours.  CBC: Recent Labs  Lab 09/15/21 1807 09/16/21 0656 09/17/21 0534  WBC 16.4* 14.4* 16.2*  NEUTROABS 13.4*  --  14.9*  HGB 12.6 12.4 11.3*  HCT 36.4 36.2 32.8*  MCV 86.7 86.4 86.3  PLT 287 288 268   Cardiac Enzymes: No results for input(s): CKTOTAL, CKMB, CKMBINDEX, TROPONINI in the last 168 hours. BNP (last 3 results) No results for input(s): PROBNP in the last 8760 hours. CBG: No results for input(s): GLUCAP in the last 168 hours. D-Dimer: No results for input(s): DDIMER in the last 72 hours. Hgb A1c: No results for input(s): HGBA1C in the last 72 hours. Lipid Profile: No results for input(s): CHOL, HDL, LDLCALC, TRIG, CHOLHDL, LDLDIRECT in the last 72 hours. Thyroid function studies: No results for input(s): TSH, T4TOTAL, T3FREE, THYROIDAB in the last 72 hours.  Invalid input(s): FREET3 Anemia work up: No results for input(s): VITAMINB12, FOLATE, FERRITIN, TIBC, IRON, RETICCTPCT in the last 72 hours. Sepsis Labs: Recent Labs  Lab 09/15/21 1807 09/16/21 0656 09/17/21 0534  PROCALCITON <0.10  --   --   WBC 16.4* 14.4* 16.2*  LATICACIDVEN 1.5  --   --     Microbiology Recent Results (from the past 240 hour(s))  Resp Panel by RT-PCR (Flu A&B, Covid) Nasopharyngeal Swab     Status: None   Collection Time:  09/16/21 12:49 AM   Specimen: Nasopharyngeal Swab; Nasopharyngeal(NP) swabs in vial transport medium  Result Value Ref Range Status   SARS Coronavirus 2 by RT PCR NEGATIVE NEGATIVE Final    Comment: (NOTE) SARS-CoV-2 target nucleic acids are NOT DETECTED.  The SARS-CoV-2 RNA is  generally detectable in upper respiratory specimens during the acute phase of infection. The lowest concentration of SARS-CoV-2 viral copies this assay can detect is 138 copies/mL. A negative result does not preclude SARS-Cov-2 infection and should not be used as the sole basis for treatment or other patient management decisions. A negative result may occur with  improper specimen collection/handling, submission of specimen other than nasopharyngeal swab, presence of viral mutation(s) within the areas targeted by this assay, and inadequate number of viral copies(<138 copies/mL). A negative result must be combined with clinical observations, patient history, and epidemiological information. The expected result is Negative.  Fact Sheet for Patients:  EntrepreneurPulse.com.au  Fact Sheet for Healthcare Providers:  IncredibleEmployment.be  This test is no t yet approved or cleared by the [place] FDA and  has been authorized for detection and/or diagnosis of SARS-CoV-2 by FDA under an Emergency Use Authorization (EUA). This EUA will remain  in effect (meaning this test can be used) for the duration of the COVID-19 declaration under Section 564(b)(1) of the Act, 21 U.S.C.section 360bbb-3(b)(1), unless the authorization is terminated  or revoked sooner.       Influenza A by PCR NEGATIVE NEGATIVE Final   Influenza B by PCR NEGATIVE NEGATIVE Final    Comment: (NOTE) The Xpert Xpress SARS-CoV-2/FLU/RSV plus assay is intended as an aid in the diagnosis of influenza from Nasopharyngeal swab specimens and should not be used as a sole basis for treatment. Nasal washings  and aspirates are unacceptable for Xpert Xpress SARS-CoV-2/FLU/RSV testing.  Fact Sheet for Patients: EntrepreneurPulse.com.au  Fact Sheet for Healthcare Providers: IncredibleEmployment.be  This test is not yet approved or cleared by the [place] FDA and has been authorized for detection and/or diagnosis of SARS-CoV-2 by FDA under an Emergency Use Authorization (EUA). This EUA will remain in effect (meaning this test can be used) for the duration of the COVID-19 declaration under Section 564(b)(1) of the Act, 21 U.S.C. section 360bbb-3(b)(1), unless the authorization is terminated or revoked.  Performed at [facility], [address] [place], [place] [address]   Culture, blood (Routine X 2) w Reflex to ID Panel     Status: None (Preliminary result)   Collection Time: 09/16/21 12:49 AM   Specimen: BLOOD  Result Value Ref Range Status   Specimen Description BLOOD RAC  Final   Special Requests   Final    BOTTLES DRAWN AEROBIC AND ANAEROBIC Blood Culture results may not be optimal due to an excessive volume of blood received in culture bottles   Culture   Final    NO GROWTH 1 DAY Performed at [facility], [address], [place], [place] [address]    Report Status PENDING  Incomplete  Culture, blood (Routine X 2) w Reflex to ID Panel     Status: None (Preliminary result)   Collection Time: 09/16/21 12:50 AM   Specimen: BLOOD  Result Value Ref Range Status   Specimen Description BLOOD R FOREARM  Final   Special Requests   Final    BOTTLES DRAWN AEROBIC AND ANAEROBIC Blood Culture adequate volume   Culture   Final    NO GROWTH 1 DAY Performed at [facility], [address] [place], [place] [address]    Report Status PENDING  Incomplete  Respiratory (~20 pathogens) panel by PCR     Status: Abnormal   Collection Time: 09/16/21  3:37 AM   Specimen: Nasopharyngeal Swab; Respiratory  Result Value Ref Range  Status   Adenovirus  NOT DETECTED NOT DETECTED Final   Coronavirus 229E NOT DETECTED NOT DETECTED Final    Comment: (NOTE) The Coronavirus on the Respiratory Panel, DOES NOT test for the novel  Coronavirus (2019 nCoV)    Coronavirus HKU1 NOT DETECTED NOT DETECTED Final   Coronavirus NL63 NOT DETECTED NOT DETECTED Final   Coronavirus OC43 NOT DETECTED NOT DETECTED Final   Metapneumovirus NOT DETECTED NOT DETECTED Final   Rhinovirus / Enterovirus NOT DETECTED NOT DETECTED Final   Influenza A NOT DETECTED NOT DETECTED Final   Influenza B NOT DETECTED NOT DETECTED Final   Parainfluenza Virus 1 NOT DETECTED NOT DETECTED Final   Parainfluenza Virus 2 NOT DETECTED NOT DETECTED Final   Parainfluenza Virus 3 NOT DETECTED NOT DETECTED Final   Parainfluenza Virus 4 NOT DETECTED NOT DETECTED Final   Respiratory Syncytial Virus DETECTED (A) NOT DETECTED Final   Bordetella pertussis NOT DETECTED NOT DETECTED Final   Bordetella Parapertussis NOT DETECTED NOT DETECTED Final   Chlamydophila pneumoniae NOT DETECTED NOT DETECTED Final   Mycoplasma pneumoniae NOT DETECTED NOT DETECTED Final    Comment: Performed at [facility], [place] [address] [place], [place] [address]  Expectorated Sputum Assessment w Gram Stain, Rflx to Resp Cult     Status: None   Collection Time: 09/16/21  9:13 AM   Specimen: Sputum  Result Value Ref Range Status   Specimen Description SPUTUM  Final   Special Requests NONE  Final   Sputum evaluation   Final    THIS SPECIMEN IS ACCEPTABLE FOR SPUTUM CULTURE Performed at [facility], [address] [place], [place] [address]    Report Status 09/17/2021 FINAL  Final  Culture, Respiratory w Gram Stain     Status: None (Preliminary result)   Collection Time: 09/16/21  9:13 AM   Specimen: SPU  Result Value Ref Range Status   Specimen Description   Final    SPUTUM Performed at [facility], [address], [place], [place] [address]    Special  Requests   Final    NONE Reflexed from [phone] [phone] Performed at [facility], [address] [place], [place] [address]    Gram Stain   Final    FEW WBC PRESENT, PREDOMINANTLY PMN MODERATE GRAM POSITIVE COCCI IN PAIRS IN CHAINS FEW GRAM NEGATIVE RODS    Culture   Final    CULTURE REINCUBATED FOR BETTER GROWTH Performed at [facility], [place] [address], [place], [place] [address]    Report Status PENDING  Incomplete    Procedures and diagnostic studies:  DG Chest 2 View  Result Date: 09/15/2021 CLINICAL DATA:  Shortness of breath x1 month. EXAM: CHEST - 2 VIEW COMPARISON:  June 16, 2021 FINDINGS: The heart size and mediastinal contours are within normal limits. Both lungs are hyperinflated and otherwise clear. The visualized skeletal structures are unremarkable. IMPRESSION: No active cardiopulmonary disease. Electronically Signed   By: Virgina Norfolk M.D.   On: 09/15/2021 18:23   CT Angio Chest PE W and/or Wo Contrast  Result Date: 09/16/2021 CLINICAL DATA:  PE suspected, high prob.  Dyspnea EXAM: CT ANGIOGRAPHY CHEST WITH CONTRAST TECHNIQUE: Multidetector CT imaging of the chest was performed using the standard protocol during bolus administration of intravenous contrast. Multiplanar CT image reconstructions and MIPs were obtained to evaluate the vascular anatomy. CONTRAST:  31mL OMNIPAQUE IOHEXOL 350 MG/ML SOLN COMPARISON:  06/16/2021 FINDINGS: Cardiovascular: Adequate opacification of the pulmonary arterial tree. No intraluminal filling defect identified to  suggest acute pulmonary embolism. Central pulmonary arteries are of normal caliber. Mild coronary artery calcification. Global cardiac size within normal limits. No pericardial effusion. Mild atherosclerotic calcification within the thoracic aorta. No aortic aneurysm. Mediastinum/Nodes: No enlarged mediastinal, hilar, or axillary lymph nodes. Thyroid gland, trachea, and esophagus demonstrate no significant findings.  Lungs/Pleura: Nodular centrilobular and peribronchovascular infiltrate and asymmetric peripheral airway impaction is seen within the lung bases bilaterally, significantly progressed since prior examination. Peripheral peribronchovascular nodular infiltrate within the right upper lobe, right middle lobe, and superior segment of the right lower lobe laterally as well as the lingula also appears progressive since prior examination. The findings are most suggestive of multifocal pneumonia in the acute setting. Superimposed aspiration, however, is difficult to exclude. No pneumothorax or pleural effusion. There is progressive bronchial wall thickening in keeping with airway inflammation. No central obstructing lesion. Upper Abdomen: No acute abnormality. Musculoskeletal: No chest wall abnormality. No acute or significant osseous findings. Review of the MIP images confirms the above findings. IMPRESSION: No pulmonary embolism. Mild coronary artery calcification. Marked interval progression of peribronchovascular nodular pulmonary infiltrate within the lung periphery, asymmetrically more severe within the lung bases bilaterally. The findings are most suggestive of progressive multifocal pneumonia, though given the basilar predominance and airway impaction, superimposed aspiration is difficult to exclude. Aortic Atherosclerosis (ICD10-I70.0). Electronically Signed   By: Fidela Salisbury M.D.   On: 09/16/2021 01:50               LOS: 1 day   Jancie Kercher  [facility]   Pager on [email or web address]. If 7PM-7AM, please contact night-coverage at [email or web address]     09/17/2021, 2:32 PM

## 2021-09-18 DIAGNOSIS — B338 Other specified viral diseases: Secondary | ICD-10-CM

## 2021-09-18 LAB — CBC WITH DIFFERENTIAL/PLATELET
Abs Immature Granulocytes: 0.04 10*3/uL (ref 0.00–0.07)
Basophils Absolute: 0 10*3/uL (ref 0.0–0.1)
Basophils Relative: 0 %
Eosinophils Absolute: 0 10*3/uL (ref 0.0–0.5)
Eosinophils Relative: 0 %
HCT: 32.4 % — ABNORMAL LOW (ref 36.0–46.0)
Hemoglobin: 11.3 g/dL — ABNORMAL LOW (ref 12.0–15.0)
Immature Granulocytes: 0 %
Lymphocytes Relative: 25 %
Lymphs Abs: 2.3 10*3/uL (ref 0.7–4.0)
MCH: 30.3 pg (ref 26.0–34.0)
MCHC: 34.9 g/dL (ref 30.0–36.0)
MCV: 86.9 fL (ref 80.0–100.0)
Monocytes Absolute: 0.4 10*3/uL (ref 0.1–1.0)
Monocytes Relative: 4 %
Neutro Abs: 6.5 10*3/uL (ref 1.7–7.7)
Neutrophils Relative %: 71 %
Platelets: 275 10*3/uL (ref 150–400)
RBC: 3.73 MIL/uL — ABNORMAL LOW (ref 3.87–5.11)
RDW: 13.6 % (ref 11.5–15.5)
WBC: 9.3 10*3/uL (ref 4.0–10.5)
nRBC: 0 % (ref 0.0–0.2)

## 2021-09-18 LAB — CULTURE, RESPIRATORY W GRAM STAIN: Culture: NORMAL

## 2021-09-18 MED ORDER — PREDNISONE 20 MG PO TABS: 40.0000 mg | ORAL_TABLET | Freq: Every day | ORAL | 0 refills | Status: AC

## 2021-09-18 MED ORDER — CEFDINIR 300 MG PO CAPS: 300.0000 mg | ORAL_CAPSULE | Freq: Two times a day (BID) | ORAL | 0 refills | Status: AC

## 2021-09-18 MED ORDER — PROMETHAZINE-DM 6.25-15 MG/5ML PO SYRP: 5.0000 mL | ORAL_SOLUTION | Freq: Four times a day (QID) | ORAL | 0 refills | Status: DC | PRN

## 2021-09-18 MED ORDER — PREDNISONE 20 MG PO TABS
40.0000 mg | ORAL_TABLET | Freq: Once | ORAL | Status: AC
  Administered 2021-09-18: 40 mg via ORAL
  Filled 2021-09-18: qty 2

## 2021-09-18 NOTE — Plan of Care (Signed)

## 2021-09-18 NOTE — Discharge Summary (Addendum)
Physician Discharge Summary  Sonya Gaines KPT:[id number] DOB: 07-08-1946 DOA: 09/15/2021  PCP: Tracie Harrier, MD  Admit date: 09/15/2021 Discharge date: 09/18/2021  Discharge disposition: Home   Recommendations for Outpatient Follow-Up:   Follow up with PCP in 1 week   Discharge Diagnosis:   Principal Problem:   Multifocal pneumonia Active Problems:   GERD (gastroesophageal reflux disease)   Essential (primary) hypertension   COPD with acute exacerbation (HCC)   Glaucoma   At risk for allergic reaction to medication   RSV (respiratory syncytial virus infection)    Discharge Condition: Stable.  Diet recommendation:  Diet Order             Diet - low sodium heart healthy                     Code Status: Prior     Hospital Course:   Sonya Gaines is a 75 y.o. female with medical history significant for COPD, remote history of tobacco use disorder, chronic sinusitis, chronic cough, history of COVID-19 infection in July 2022, recent treatment with Ceftin in August 2022 and treatment with doxycycline about 2 weeks prior to admission for lung infection.  She presented to the hospital because of shortness of breath, cough, fever and generalized weakness.   She was admitted to the hospital for RSV infection, multifocal pneumonia and COPD exacerbation.  It was difficult to determine whether pneumonia was viral or bacterial.  She was treated with empiric IV antibiotics, steroids and bronchodilators and her condition improved.  She is deemed stable for discharge to home today.     Discharge Exam:    Vitals:   09/17/21 2001 09/18/21 0327 09/18/21 0811 09/18/21 0838  BP:  128/64  (!) 118/93  Pulse:  66  64  Resp:  16    Temp:  98 F (36.7 C)  97.7 F (36.5 C)  TempSrc:  Oral  Oral  SpO2: 97% 97% 97% 97%  Weight:      Height:         GEN: NAD SKIN: No rash EYES: EOMI ENT: MMM CV: RRR PULM: CTA B ABD: soft, ND, NT, +BS CNS: AAO x 3,  non focal EXT: No edema or tenderness   The results of significant diagnostics from this hospitalization (including imaging, microbiology, ancillary and laboratory) are listed below for reference.     Procedures and Diagnostic Studies:   DG Chest 2 View  Result Date: 09/15/2021 CLINICAL DATA:  Shortness of breath x1 month. EXAM: CHEST - 2 VIEW COMPARISON:  June 16, 2021 FINDINGS: The heart size and mediastinal contours are within normal limits. Both lungs are hyperinflated and otherwise clear. The visualized skeletal structures are unremarkable. IMPRESSION: No active cardiopulmonary disease. Electronically Signed   By: Virgina Norfolk M.D.   On: 09/15/2021 18:23   CT Angio Chest PE W and/or Wo Contrast  Result Date: 09/16/2021 CLINICAL DATA:  PE suspected, high prob.  Dyspnea EXAM: CT ANGIOGRAPHY CHEST WITH CONTRAST TECHNIQUE: Multidetector CT imaging of the chest was performed using the standard protocol during bolus administration of intravenous contrast. Multiplanar CT image reconstructions and MIPs were obtained to evaluate the vascular anatomy. CONTRAST:  61mL OMNIPAQUE IOHEXOL 350 MG/ML SOLN COMPARISON:  06/16/2021 FINDINGS: Cardiovascular: Adequate opacification of the pulmonary arterial tree. No intraluminal filling defect identified to suggest acute pulmonary embolism. Central pulmonary arteries are of normal caliber. Mild coronary artery calcification. Global cardiac size within normal limits. No pericardial effusion. Mild atherosclerotic  calcification within the thoracic aorta. No aortic aneurysm. Mediastinum/Nodes: No enlarged mediastinal, hilar, or axillary lymph nodes. Thyroid gland, trachea, and esophagus demonstrate no significant findings. Lungs/Pleura: Nodular centrilobular and peribronchovascular infiltrate and asymmetric peripheral airway impaction is seen within the lung bases bilaterally, significantly progressed since prior examination. Peripheral peribronchovascular nodular  infiltrate within the right upper lobe, right middle lobe, and superior segment of the right lower lobe laterally as well as the lingula also appears progressive since prior examination. The findings are most suggestive of multifocal pneumonia in the acute setting. Superimposed aspiration, however, is difficult to exclude. No pneumothorax or pleural effusion. There is progressive bronchial wall thickening in keeping with airway inflammation. No central obstructing lesion. Upper Abdomen: No acute abnormality. Musculoskeletal: No chest wall abnormality. No acute or significant osseous findings. Review of the MIP images confirms the above findings. IMPRESSION: No pulmonary embolism. Mild coronary artery calcification. Marked interval progression of peribronchovascular nodular pulmonary infiltrate within the lung periphery, asymmetrically more severe within the lung bases bilaterally. The findings are most suggestive of progressive multifocal pneumonia, though given the basilar predominance and airway impaction, superimposed aspiration is difficult to exclude. Aortic Atherosclerosis (ICD10-I70.0). Electronically Signed   By: Fidela Salisbury M.D.   On: 09/16/2021 01:50     Labs:   Basic Metabolic Panel: Recent Labs  Lab 09/15/21 1807 09/16/21 0656 09/17/21 0534  NA 132*  --  136  K 4.0  --  4.2  CL 99  --  104  CO2 23  --  26  GLUCOSE 201*  --  162*  BUN 13  --  20  CREATININE 0.93 1.00 0.62  CALCIUM 9.1  --  9.1   GFR Estimated Creatinine Clearance: 50.5 mL/min (by C-G formula based on SCr of 0.62 mg/dL). Liver Function Tests: Recent Labs  Lab 09/15/21 1807  AST 19  ALT 15  ALKPHOS 67  BILITOT 0.7  PROT 6.9  ALBUMIN 3.3*   No results for input(s): LIPASE, AMYLASE in the last 168 hours. No results for input(s): AMMONIA in the last 168 hours. Coagulation profile No results for input(s): INR, PROTIME in the last 168 hours.  CBC: Recent Labs  Lab 09/15/21 1807 09/16/21 0656  09/17/21 0534 09/18/21 0846  WBC 16.4* 14.4* 16.2* 9.3  NEUTROABS 13.4*  --  14.9* 6.5  HGB 12.6 12.4 11.3* 11.3*  HCT 36.4 36.2 32.8* 32.4*  MCV 86.7 86.4 86.3 86.9  PLT 287 288 268 275   Cardiac Enzymes: No results for input(s): CKTOTAL, CKMB, CKMBINDEX, TROPONINI in the last 168 hours. BNP: Invalid input(s): POCBNP CBG: No results for input(s): GLUCAP in the last 168 hours. D-Dimer No results for input(s): DDIMER in the last 72 hours. Hgb A1c No results for input(s): HGBA1C in the last 72 hours. Lipid Profile No results for input(s): CHOL, HDL, LDLCALC, TRIG, CHOLHDL, LDLDIRECT in the last 72 hours. Thyroid function studies No results for input(s): TSH, T4TOTAL, T3FREE, THYROIDAB in the last 72 hours.  Invalid input(s): FREET3 Anemia work up No results for input(s): VITAMINB12, FOLATE, FERRITIN, TIBC, IRON, RETICCTPCT in the last 72 hours. Microbiology Recent Results (from the past 240 hour(s))  Resp Panel by RT-PCR (Flu A&B, Covid) Nasopharyngeal Swab     Status: None   Collection Time: 09/16/21 12:49 AM   Specimen: Nasopharyngeal Swab; Nasopharyngeal(NP) swabs in vial transport medium  Result Value Ref Range Status   SARS Coronavirus 2 by RT PCR NEGATIVE NEGATIVE Final    Comment: (NOTE) SARS-CoV-2 target nucleic acids are  NOT DETECTED.  The SARS-CoV-2 RNA is generally detectable in upper respiratory specimens during the acute phase of infection. The lowest concentration of SARS-CoV-2 viral copies this assay can detect is 138 copies/mL. A negative result does not preclude SARS-Cov-2 infection and should not be used as the sole basis for treatment or other patient management decisions. A negative result may occur with  improper specimen collection/handling, submission of specimen other than nasopharyngeal swab, presence of viral mutation(s) within the areas targeted by this assay, and inadequate number of viral copies(<138 copies/mL). A negative result must be  combined with clinical observations, patient history, and epidemiological information. The expected result is Negative.  Fact Sheet for Patients:  EntrepreneurPulse.com.au  Fact Sheet for Healthcare Providers:  IncredibleEmployment.be  This test is no t yet approved or cleared by the [place] FDA and  has been authorized for detection and/or diagnosis of SARS-CoV-2 by FDA under an Emergency Use Authorization (EUA). This EUA will remain  in effect (meaning this test can be used) for the duration of the COVID-19 declaration under Section 564(b)(1) of the Act, 21 U.S.C.section 360bbb-3(b)(1), unless the authorization is terminated  or revoked sooner.       Influenza A by PCR NEGATIVE NEGATIVE Final   Influenza B by PCR NEGATIVE NEGATIVE Final    Comment: (NOTE) The Xpert Xpress SARS-CoV-2/FLU/RSV plus assay is intended as an aid in the diagnosis of influenza from Nasopharyngeal swab specimens and should not be used as a sole basis for treatment. Nasal washings and aspirates are unacceptable for Xpert Xpress SARS-CoV-2/FLU/RSV testing.  Fact Sheet for Patients: EntrepreneurPulse.com.au  Fact Sheet for Healthcare Providers: IncredibleEmployment.be  This test is not yet approved or cleared by the [place] FDA and has been authorized for detection and/or diagnosis of SARS-CoV-2 by FDA under an Emergency Use Authorization (EUA). This EUA will remain in effect (meaning this test can be used) for the duration of the COVID-19 declaration under Section 564(b)(1) of the Act, 21 U.S.C. section 360bbb-3(b)(1), unless the authorization is terminated or revoked.  Performed at [facility], [address] [place], [place] [address]   Culture, blood (Routine X 2) w Reflex to ID Panel     Status: None (Preliminary result)   Collection Time: 09/16/21 12:49 AM   Specimen: BLOOD  Result Value Ref  Range Status   Specimen Description BLOOD RAC  Final   Special Requests   Final    BOTTLES DRAWN AEROBIC AND ANAEROBIC Blood Culture results may not be optimal due to an excessive volume of blood received in culture bottles   Culture   Final    NO GROWTH 2 DAYS Performed at [facility], [address] [place], [place] [address]    Report Status PENDING  Incomplete  Culture, blood (Routine X 2) w Reflex to ID Panel     Status: None (Preliminary result)   Collection Time: 09/16/21 12:50 AM   Specimen: BLOOD  Result Value Ref Range Status   Specimen Description BLOOD R FOREARM  Final   Special Requests   Final    BOTTLES DRAWN AEROBIC AND ANAEROBIC Blood Culture adequate volume   Culture   Final    NO GROWTH 2 DAYS Performed at [facility], [address] [place], [place] [address]    Report Status PENDING  Incomplete  Respiratory (~20 pathogens) panel by PCR     Status: Abnormal   Collection Time: 09/16/21  3:37 AM   Specimen: Nasopharyngeal Swab; Respiratory  Result  Value Ref Range Status   Adenovirus NOT DETECTED NOT DETECTED Final   Coronavirus 229E NOT DETECTED NOT DETECTED Final    Comment: (NOTE) The Coronavirus on the Respiratory Panel, DOES NOT test for the novel  Coronavirus (2019 nCoV)    Coronavirus HKU1 NOT DETECTED NOT DETECTED Final   Coronavirus NL63 NOT DETECTED NOT DETECTED Final   Coronavirus OC43 NOT DETECTED NOT DETECTED Final   Metapneumovirus NOT DETECTED NOT DETECTED Final   Rhinovirus / Enterovirus NOT DETECTED NOT DETECTED Final   Influenza A NOT DETECTED NOT DETECTED Final   Influenza B NOT DETECTED NOT DETECTED Final   Parainfluenza Virus 1 NOT DETECTED NOT DETECTED Final   Parainfluenza Virus 2 NOT DETECTED NOT DETECTED Final   Parainfluenza Virus 3 NOT DETECTED NOT DETECTED Final   Parainfluenza Virus 4 NOT DETECTED NOT DETECTED Final   Respiratory Syncytial Virus DETECTED (A) NOT DETECTED Final   Bordetella pertussis NOT  DETECTED NOT DETECTED Final   Bordetella Parapertussis NOT DETECTED NOT DETECTED Final   Chlamydophila pneumoniae NOT DETECTED NOT DETECTED Final   Mycoplasma pneumoniae NOT DETECTED NOT DETECTED Final    Comment: Performed at [facility], [place] [address] [place], [place] [address]  Expectorated Sputum Assessment w Gram Stain, Rflx to Resp Cult     Status: None   Collection Time: 09/16/21  9:13 AM   Specimen: Sputum  Result Value Ref Range Status   Specimen Description SPUTUM  Final   Special Requests NONE  Final   Sputum evaluation   Final    THIS SPECIMEN IS ACCEPTABLE FOR SPUTUM CULTURE Performed at [facility], [address] [place], [place] [address]    Report Status 09/17/2021 FINAL  Final  Culture, Respiratory w Gram Stain     Status: None   Collection Time: 09/16/21  9:13 AM   Specimen: SPU  Result Value Ref Range Status   Specimen Description   Final    SPUTUM Performed at [facility], [address] [place], [place] [address]    Special Requests   Final    NONE Reflexed from [phone] Performed at [facility], [address] [place], [place] [address]    Gram Stain   Final    FEW WBC PRESENT, PREDOMINANTLY PMN MODERATE GRAM POSITIVE COCCI IN PAIRS IN CHAINS FEW GRAM NEGATIVE RODS    Culture   Final    Normal respiratory flora-no Staph aureus or Pseudomonas seen Performed at [facility], [place] [address], [place], [place] [address]    Report Status 09/18/2021 FINAL  Final  Resp Panel by RT-PCR (Flu A&B, Covid) Nasopharyngeal Swab     Status: None   Collection Time: 09/17/21  4:12 PM   Specimen: Nasopharyngeal Swab; Nasopharyngeal(NP) swabs in vial transport medium  Result Value Ref Range Status   SARS Coronavirus 2 by RT PCR NEGATIVE NEGATIVE Final    Comment: (NOTE) SARS-CoV-2 target nucleic acids are NOT DETECTED.  The SARS-CoV-2 RNA is generally detectable in upper respiratory specimens during the acute phase of  infection. The lowest concentration of SARS-CoV-2 viral copies this assay can detect is 138 copies/mL. A negative result does not preclude SARS-Cov-2 infection and should not be used as the sole basis for treatment or other patient management decisions. A negative result may occur with  improper specimen collection/handling, submission of specimen other than nasopharyngeal swab, presence of viral mutation(s) within the areas targeted by this assay, and inadequate number of viral copies(<138 copies/mL).  A negative result must be combined with clinical observations, patient history, and epidemiological information. The expected result is Negative.  Fact Sheet for Patients:  EntrepreneurPulse.com.au  Fact Sheet for Healthcare Providers:  IncredibleEmployment.be  This test is no t yet approved or cleared by the [place] FDA and  has been authorized for detection and/or diagnosis of SARS-CoV-2 by FDA under an Emergency Use Authorization (EUA). This EUA will remain  in effect (meaning this test can be used) for the duration of the COVID-19 declaration under Section 564(b)(1) of the Act, 21 U.S.C.section 360bbb-3(b)(1), unless the authorization is terminated  or revoked sooner.       Influenza A by PCR NEGATIVE NEGATIVE Final   Influenza B by PCR NEGATIVE NEGATIVE Final    Comment: (NOTE) The Xpert Xpress SARS-CoV-2/FLU/RSV plus assay is intended as an aid in the diagnosis of influenza from Nasopharyngeal swab specimens and should not be used as a sole basis for treatment. Nasal washings and aspirates are unacceptable for Xpert Xpress SARS-CoV-2/FLU/RSV testing.  Fact Sheet for Patients: EntrepreneurPulse.com.au  Fact Sheet for Healthcare Providers: IncredibleEmployment.be  This test is not yet approved or cleared by the [place] FDA and has been authorized for detection and/or diagnosis of SARS-CoV-2  by FDA under an Emergency Use Authorization (EUA). This EUA will remain in effect (meaning this test can be used) for the duration of the COVID-19 declaration under Section 564(b)(1) of the Act, 21 U.S.C. section 360bbb-3(b)(1), unless the authorization is terminated or revoked.  Performed at [facility], [address] [place], [place] [address]      Discharge Instructions:   Discharge Instructions     Diet - low sodium heart healthy   Complete by: As directed    Increase activity slowly   Complete by: As directed       Allergies as of 09/18/2021       Reactions   Apraclonidine Rash   Cheese Other (See Comments)   Sinus congestion   Chocolate Flavor Other (See Comments)   Sinus congestion   Lac Bovis Other (See Comments)   Sinus congestion   Brimonidine Rash   Clarithromycin Rash   Clindamycin/lincomycin Swelling, Rash   Levofloxacin Rash   Nickel Rash   Penicillins Rash   Polytrim [polymyxin B-trimethoprim] Rash   Statins Rash   Sulfamethoxazole Rash   Sulfonamide Derivatives Rash   Tobramycin Rash        Medication List     STOP taking these medications    alendronate 70 MG tablet Commonly known as: FOSAMAX   calcium-vitamin D 500-400 MG-UNIT tablet Commonly known as: OSCAL-500   doxycycline 20 MG tablet Commonly known as: PERIOSTAT   enoxaparin 40 MG/0.4ML injection Commonly known as: LOVENOX   loteprednol 0.5 % ophthalmic suspension Commonly known as: LOTEMAX   magnesium citrate Soln   oxyCODONE 5 MG immediate release tablet Commonly known as: Oxy IR/ROXICODONE   predniSONE 10 MG (21) Tbpk tablet Commonly known as: STERAPRED UNI-PAK 21 TAB Replaced by: predniSONE 20 MG tablet   Rhopressa 0.02 % Soln Generic drug: Netarsudil Dimesylate   senna-docusate 8.6-50 MG tablet Commonly known as: Senokot-S   Xiidra 5 % Soln Generic drug: Lifitegrast       TAKE these medications    albuterol 108 (90 Base) MCG/ACT  inhaler Commonly known as: VENTOLIN HFA Inhale 2 puffs into the lungs every 6 (six) hours as needed for wheezing or shortness of breath.   amLODipine 2.5 MG tablet Commonly known as: NORVASC  Take 2.5 mg by mouth daily.   budesonide-formoterol 160-4.5 MCG/ACT inhaler Commonly known as: SYMBICORT Inhale 2 puffs into the lungs 2 (two) times daily.   cefdinir 300 MG capsule Commonly known as: OMNICEF Take 1 capsule (300 mg total) by mouth 2 (two) times daily for 4 days.   citalopram 10 MG tablet Commonly known as: CELEXA Take 10 mg by mouth daily.   dorzolamide-timolol 22.3-6.8 MG/ML ophthalmic solution Commonly known as: COSOPT Place 1 drop into the right eye 2 (two) times daily.   esomeprazole 40 MG capsule Commonly known as: NEXIUM Take 40 mg by mouth daily.   ezetimibe 10 MG tablet Commonly known as: ZETIA Take 10 mg by mouth daily.   Ferrex 150 150 MG capsule Generic drug: iron polysaccharides Take 150 mg by mouth every other day.   hydrALAZINE 10 MG tablet Commonly known as: APRESOLINE Take 1 tablet (10 mg total) by mouth 2 (two) times daily. What changed: Another medication with the same name was removed. Continue taking this medication, and follow the directions you see here.   ipratropium-albuterol 0.5-2.5 (3) MG/3ML Soln Commonly known as: DUONEB Inhale into the lungs.   latanoprost 0.005 % ophthalmic solution Commonly known as: XALATAN Place 1 drop into the right eye at bedtime. What changed: Another medication with the same name was removed. Continue taking this medication, and follow the directions you see here.   losartan 50 MG tablet Commonly known as: COZAAR Take 50 mg by mouth daily.   meloxicam 7.5 MG tablet Commonly known as: MOBIC Take 7.5 mg by mouth daily as needed.   montelukast 10 MG tablet Commonly known as: SINGULAIR Take 10 mg by mouth at bedtime.   predniSONE 20 MG tablet Commonly known as: DELTASONE Take 2 tablets (40 mg total)  by mouth daily for 2 days. Replaces: predniSONE 10 MG (21) Tbpk tablet   PreserVision AREDS Tabs Take 1 tablet by mouth 2 (two) times daily. What changed: Another medication with the same name was removed. Continue taking this medication, and follow the directions you see here.   promethazine-dextromethorphan 6.25-15 MG/5ML syrup Commonly known as: PROMETHAZINE-DM Take 5 mLs by mouth every 6 (six) hours as needed.           If you experience worsening of your admission symptoms, develop shortness of breath, life threatening emergency, suicidal or homicidal thoughts you must seek medical attention immediately by calling 911 or calling your MD immediately  if symptoms less severe.   You must read complete instructions/literature along with all the possible adverse reactions/side effects for all the medicines you take and that have been prescribed to you. Take any new medicines after you have completely understood and accept all the possible adverse reactions/side effects.    Please note   You were cared for by a hospitalist during your hospital stay. If you have any questions about your discharge medications or the care you received while you were in the hospital after you are discharged, you can call the unit and asked to speak with the hospitalist on call if the hospitalist that took care of you is not available. Once you are discharged, your primary care physician will handle any further medical issues. Please note that NO REFILLS for any discharge medications will be authorized once you are discharged, as it is imperative that you return to your primary care physician (or establish a relationship with a primary care physician if you do not have one) for your aftercare needs so that they  can reassess your need for medications and monitor your lab values.       Time coordinating discharge: 31 minutes  Signed:  Veron Senner  [facility] 09/18/2021, 8:59 PM   Pager on  [email or web address]. If 7PM-7AM, please contact night-coverage at [email or web address]

## 2021-09-18 NOTE — Progress Notes (Signed)
AVS given and explained to patient. Patient awaiting on daughter for pick up.

## 2021-09-21 LAB — CULTURE, BLOOD (ROUTINE X 2)
Culture: NO GROWTH
Culture: NO GROWTH
Special Requests: ADEQUATE

## 2021-10-22 ENCOUNTER — Emergency Department: Payer: Medicare PPO

## 2021-10-22 ENCOUNTER — Other Ambulatory Visit: Payer: Self-pay

## 2021-10-22 ENCOUNTER — Inpatient Hospital Stay
Admission: EM | Admit: 2021-10-22 | Discharge: 2021-10-25 | DRG: 871 | Disposition: A | Discharge status: Home / Self Care | Payer: Medicare PPO | Attending: Internal Medicine | Admitting: Internal Medicine

## 2021-10-22 DIAGNOSIS — Z7951 Long term (current) use of inhaled steroids: Secondary | ICD-10-CM

## 2021-10-22 DIAGNOSIS — J449 Chronic obstructive pulmonary disease, unspecified: Secondary | ICD-10-CM

## 2021-10-22 DIAGNOSIS — E7800 Pure hypercholesterolemia, unspecified: Secondary | ICD-10-CM

## 2021-10-22 DIAGNOSIS — J45991 Cough variant asthma: Secondary | ICD-10-CM | POA: Diagnosis present

## 2021-10-22 DIAGNOSIS — J440 Chronic obstructive pulmonary disease with acute lower respiratory infection: Secondary | ICD-10-CM | POA: Diagnosis present

## 2021-10-22 DIAGNOSIS — Z803 Family history of malignant neoplasm of breast: Secondary | ICD-10-CM

## 2021-10-22 DIAGNOSIS — Z880 Allergy status to penicillin: Secondary | ICD-10-CM

## 2021-10-22 DIAGNOSIS — Z888 Allergy status to other drugs, medicaments and biological substances status: Secondary | ICD-10-CM

## 2021-10-22 DIAGNOSIS — Z8249 Family history of ischemic heart disease and other diseases of the circulatory system: Secondary | ICD-10-CM

## 2021-10-22 DIAGNOSIS — Z881 Allergy status to other antibiotic agents status: Secondary | ICD-10-CM

## 2021-10-22 DIAGNOSIS — G4733 Obstructive sleep apnea (adult) (pediatric): Secondary | ICD-10-CM | POA: Diagnosis present

## 2021-10-22 DIAGNOSIS — Z825 Family history of asthma and other chronic lower respiratory diseases: Secondary | ICD-10-CM

## 2021-10-22 DIAGNOSIS — I10 Essential (primary) hypertension: Secondary | ICD-10-CM | POA: Diagnosis not present

## 2021-10-22 DIAGNOSIS — Z20822 Contact with and (suspected) exposure to covid-19: Secondary | ICD-10-CM | POA: Diagnosis present

## 2021-10-22 DIAGNOSIS — H409 Unspecified glaucoma: Secondary | ICD-10-CM | POA: Diagnosis present

## 2021-10-22 DIAGNOSIS — G4730 Sleep apnea, unspecified: Secondary | ICD-10-CM | POA: Diagnosis not present

## 2021-10-22 DIAGNOSIS — A419 Sepsis, unspecified organism: Secondary | ICD-10-CM | POA: Diagnosis not present

## 2021-10-22 DIAGNOSIS — J189 Pneumonia, unspecified organism: Secondary | ICD-10-CM | POA: Diagnosis present

## 2021-10-22 DIAGNOSIS — Z882 Allergy status to sulfonamides status: Secondary | ICD-10-CM

## 2021-10-22 DIAGNOSIS — F32A Depression, unspecified: Secondary | ICD-10-CM | POA: Diagnosis present

## 2021-10-22 DIAGNOSIS — E785 Hyperlipidemia, unspecified: Secondary | ICD-10-CM | POA: Diagnosis present

## 2021-10-22 DIAGNOSIS — Z8701 Personal history of pneumonia (recurrent): Secondary | ICD-10-CM

## 2021-10-22 DIAGNOSIS — Z801 Family history of malignant neoplasm of trachea, bronchus and lung: Secondary | ICD-10-CM

## 2021-10-22 DIAGNOSIS — Z79899 Other long term (current) drug therapy: Secondary | ICD-10-CM

## 2021-10-22 DIAGNOSIS — F419 Anxiety disorder, unspecified: Secondary | ICD-10-CM | POA: Diagnosis present

## 2021-10-22 DIAGNOSIS — M810 Age-related osteoporosis without current pathological fracture: Secondary | ICD-10-CM | POA: Diagnosis present

## 2021-10-22 DIAGNOSIS — Z91018 Allergy to other foods: Secondary | ICD-10-CM

## 2021-10-22 DIAGNOSIS — J441 Chronic obstructive pulmonary disease with (acute) exacerbation: Secondary | ICD-10-CM

## 2021-10-22 DIAGNOSIS — K219 Gastro-esophageal reflux disease without esophagitis: Secondary | ICD-10-CM | POA: Diagnosis present

## 2021-10-22 DIAGNOSIS — Z8616 Personal history of COVID-19: Secondary | ICD-10-CM

## 2021-10-22 DIAGNOSIS — Z87891 Personal history of nicotine dependence: Secondary | ICD-10-CM

## 2021-10-22 DIAGNOSIS — R0602 Shortness of breath: Secondary | ICD-10-CM | POA: Diagnosis not present

## 2021-10-22 LAB — COMPREHENSIVE METABOLIC PANEL
ALT: 20 U/L (ref 0–44)
AST: 21 U/L (ref 15–41)
Albumin: 4.1 g/dL (ref 3.5–5.0)
Alkaline Phosphatase: 82 U/L (ref 38–126)
Anion gap: 6 (ref 5–15)
BUN: 18 mg/dL (ref 8–23)
CO2: 28 mmol/L (ref 22–32)
Calcium: 9.6 mg/dL (ref 8.9–10.3)
Chloride: 101 mmol/L (ref 98–111)
Creatinine, Ser: 0.73 mg/dL (ref 0.44–1.00)
GFR, Estimated: >60 mL/min (ref >60)
Glucose, Bld: 98 mg/dL (ref 70–99)
Potassium: 4.2 mmol/L (ref 3.5–5.1)
Sodium: 135 mmol/L (ref 135–145)
Total Bilirubin: 0.5 mg/dL (ref 0.3–1.2)
Total Protein: 7.6 g/dL (ref 6.5–8.1)

## 2021-10-22 LAB — URINALYSIS, COMPLETE (UACMP) WITH MICROSCOPIC
Bacteria, UA: NONE SEEN
Bilirubin Urine: NEGATIVE
Glucose, UA: NEGATIVE mg/dL
Hgb urine dipstick: NEGATIVE
Ketones, ur: NEGATIVE mg/dL
Leukocytes,Ua: NEGATIVE
Nitrite: NEGATIVE
Protein, ur: NEGATIVE mg/dL
Specific Gravity, Urine: 1.021 (ref 1.005–1.030)
Squamous Epithelial / HPF: NONE SEEN (ref 0–5)
pH: 6 (ref 5.0–8.0)

## 2021-10-22 LAB — TROPONIN I (HIGH SENSITIVITY)
Troponin I (High Sensitivity): 3 ng/L (ref <18)
Troponin I (High Sensitivity): 4 ng/L (ref <18)

## 2021-10-22 LAB — CBC
HCT: 43.2 % (ref 36.0–46.0)
Hemoglobin: 13.9 g/dL (ref 12.0–15.0)
MCH: 28.7 pg (ref 26.0–34.0)
MCHC: 32.2 g/dL (ref 30.0–36.0)
MCV: 89.1 fL (ref 80.0–100.0)
Platelets: 303 10*3/uL (ref 150–400)
RBC: 4.85 MIL/uL (ref 3.87–5.11)
RDW: 13.7 % (ref 11.5–15.5)
WBC: 14.4 10*3/uL — ABNORMAL HIGH (ref 4.0–10.5)
nRBC: 0 % (ref 0.0–0.2)

## 2021-10-22 LAB — RESP PANEL BY RT-PCR (FLU A&B, COVID) ARPGX2
Influenza A by PCR: NEGATIVE
Influenza B by PCR: NEGATIVE
SARS Coronavirus 2 by RT PCR: NEGATIVE

## 2021-10-22 LAB — PROCALCITONIN: Procalcitonin: 0.15 ng/mL

## 2021-10-22 LAB — LACTIC ACID, PLASMA: Lactic Acid, Venous: 3.8 mmol/L (ref 0.5–1.9)

## 2021-10-22 MED ORDER — NETARSUDIL DIMESYLATE 0.02 % OP SOLN: 1.0000 [drp] | Freq: Every day | OPHTHALMIC | Status: DC

## 2021-10-22 MED ORDER — ENOXAPARIN SODIUM 40 MG/0.4ML IJ SOSY
40.0000 mg | PREFILLED_SYRINGE | INTRAMUSCULAR | Status: DC
  Administered 2021-10-23 – 2021-10-24 (×3): 40 mg via SUBCUTANEOUS
  Filled 2021-10-22 (×3): qty 0.4

## 2021-10-22 MED ORDER — DORZOLAMIDE HCL-TIMOLOL MAL 2-0.5 % OP SOLN
1.0000 [drp] | Freq: Two times a day (BID) | OPHTHALMIC | Status: DC
  Administered 2021-10-23 – 2021-10-25 (×6): 1 [drp] via OPHTHALMIC
  Filled 2021-10-22: qty 10

## 2021-10-22 MED ORDER — CALCIUM CARBONATE ANTACID 500 MG PO CHEW
1500.0000 mg | CHEWABLE_TABLET | Freq: Every day | ORAL | Status: DC
  Administered 2021-10-25: 09:00:00 1500 mg via ORAL
  Filled 2021-10-22 (×2): qty 8

## 2021-10-22 MED ORDER — SODIUM CHLORIDE 0.9 % IV SOLN: INTRAVENOUS | Status: DC

## 2021-10-22 MED ORDER — FLUTICASONE FUROATE-VILANTEROL 200-25 MCG/ACT IN AEPB
1.0000 | INHALATION_SPRAY | Freq: Every day | RESPIRATORY_TRACT | Status: DC
  Filled 2021-10-22: qty 28

## 2021-10-22 MED ORDER — PANTOPRAZOLE SODIUM 40 MG PO TBEC
40.0000 mg | DELAYED_RELEASE_TABLET | Freq: Every day | ORAL | Status: DC
  Administered 2021-10-23 – 2021-10-25 (×4): 40 mg via ORAL
  Filled 2021-10-22 (×4): qty 1

## 2021-10-22 MED ORDER — VITAMIN B-12 1000 MCG PO TABS
1000.0000 ug | ORAL_TABLET | Freq: Every day | ORAL | Status: DC
  Administered 2021-10-23 – 2021-10-25 (×4): 1000 ug via ORAL
  Filled 2021-10-22 (×4): qty 1

## 2021-10-22 MED ORDER — IPRATROPIUM-ALBUTEROL 0.5-2.5 (3) MG/3ML IN SOLN
3.0000 mL | Freq: Once | RESPIRATORY_TRACT | Status: AC
  Administered 2021-10-22: 3 mL via RESPIRATORY_TRACT
  Filled 2021-10-22: qty 3

## 2021-10-22 MED ORDER — EZETIMIBE 10 MG PO TABS
10.0000 mg | ORAL_TABLET | Freq: Every day | ORAL | Status: DC
  Administered 2021-10-23 – 2021-10-25 (×4): 10 mg via ORAL
  Filled 2021-10-22 (×4): qty 1

## 2021-10-22 MED ORDER — DOXYCYCLINE HYCLATE 100 MG PO TABS
100.0000 mg | ORAL_TABLET | Freq: Two times a day (BID) | ORAL | Status: DC
Start: 2021-10-23 — End: 2021-10-25
  Administered 2021-10-23 – 2021-10-25 (×5): 100 mg via ORAL
  Filled 2021-10-22 (×5): qty 1

## 2021-10-22 MED ORDER — POLYSACCHARIDE IRON COMPLEX 150 MG PO CAPS
150.0000 mg | ORAL_CAPSULE | ORAL | Status: DC
  Administered 2021-10-24: 150 mg via ORAL
  Filled 2021-10-22: qty 1

## 2021-10-22 MED ORDER — SODIUM CHLORIDE 0.9 % IV BOLUS
1000.0000 mL | Freq: Once | INTRAVENOUS | Status: AC
  Administered 2021-10-22: 1000 mL via INTRAVENOUS

## 2021-10-22 MED ORDER — VANCOMYCIN HCL 1500 MG/300ML IV SOLN
1500.0000 mg | Freq: Once | INTRAVENOUS | Status: AC
  Administered 2021-10-22: 1500 mg via INTRAVENOUS
  Filled 2021-10-22: qty 300

## 2021-10-22 MED ORDER — LOSARTAN POTASSIUM 50 MG PO TABS
50.0000 mg | ORAL_TABLET | Freq: Every day | ORAL | Status: DC
  Administered 2021-10-23 – 2021-10-25 (×4): 50 mg via ORAL
  Filled 2021-10-22 (×4): qty 1

## 2021-10-22 MED ORDER — IPRATROPIUM-ALBUTEROL 0.5-2.5 (3) MG/3ML IN SOLN
3.0000 mL | RESPIRATORY_TRACT | Status: DC | PRN
  Filled 2021-10-22: qty 3

## 2021-10-22 MED ORDER — LATANOPROST 0.005 % OP SOLN
1.0000 [drp] | Freq: Every day | OPHTHALMIC | Status: DC
  Administered 2021-10-23 – 2021-10-24 (×3): 1 [drp] via OPHTHALMIC
  Filled 2021-10-22: qty 2.5

## 2021-10-22 MED ORDER — OCUVITE-LUTEIN PO CAPS
1.0000 | ORAL_CAPSULE | Freq: Two times a day (BID) | ORAL | Status: DC
  Administered 2021-10-23 – 2021-10-25 (×5): 1 via ORAL
  Filled 2021-10-22 (×8): qty 1

## 2021-10-22 MED ORDER — ACETAMINOPHEN 500 MG PO TABS
1000.0000 mg | ORAL_TABLET | Freq: Once | ORAL | Status: AC
  Administered 2021-10-22: 1000 mg via ORAL
  Filled 2021-10-22: qty 2

## 2021-10-22 MED ORDER — MONTELUKAST SODIUM 10 MG PO TABS
10.0000 mg | ORAL_TABLET | Freq: Every day | ORAL | Status: DC
  Administered 2021-10-23 – 2021-10-24 (×3): 10 mg via ORAL
  Filled 2021-10-22 (×3): qty 1

## 2021-10-22 MED ORDER — HYDRALAZINE HCL 10 MG PO TABS: 10.0000 mg | ORAL_TABLET | Freq: Two times a day (BID) | ORAL | Status: DC

## 2021-10-22 MED ORDER — DOXYCYCLINE HYCLATE 100 MG PO TABS
100.0000 mg | ORAL_TABLET | Freq: Once | ORAL | Status: AC
  Administered 2021-10-22: 100 mg via ORAL
  Filled 2021-10-22: qty 1

## 2021-10-22 MED ORDER — AMLODIPINE BESYLATE 5 MG PO TABS
2.5000 mg | ORAL_TABLET | Freq: Every day | ORAL | Status: DC
  Administered 2021-10-23 – 2021-10-25 (×3): 2.5 mg via ORAL
  Filled 2021-10-22 (×3): qty 1

## 2021-10-22 MED ORDER — SODIUM CHLORIDE 0.9 % IV SOLN
1.0000 g | Freq: Once | INTRAVENOUS | Status: AC
  Administered 2021-10-22: 1 g via INTRAVENOUS
  Filled 2021-10-22: qty 10

## 2021-10-22 MED ORDER — IPRATROPIUM-ALBUTEROL 0.5-2.5 (3) MG/3ML IN SOLN
RESPIRATORY_TRACT | Status: AC
  Administered 2021-10-22: 3 mL via RESPIRATORY_TRACT
  Filled 2021-10-22: qty 9

## 2021-10-22 MED ORDER — SODIUM CHLORIDE 0.9 % IV SOLN
2.0000 g | INTRAVENOUS | Status: DC
  Administered 2021-10-23 – 2021-10-24 (×2): 2 g via INTRAVENOUS
  Filled 2021-10-22: qty 20
  Filled 2021-10-22 (×2): qty 2

## 2021-10-22 MED ORDER — CITALOPRAM HYDROBROMIDE 20 MG PO TABS
10.0000 mg | ORAL_TABLET | Freq: Every day | ORAL | Status: DC
  Administered 2021-10-23 – 2021-10-25 (×4): 10 mg via ORAL
  Filled 2021-10-22 (×4): qty 1

## 2021-10-22 NOTE — ED Provider Notes (Addendum)
[facility]  ____________________________________________   Event Date/Time   First MD Initiated Contact with Patient 10/22/21 1143     (approximate)  I have reviewed the triage vital signs and the nursing notes.   HISTORY  Chief Complaint Shortness of Breath    HPI Sonya Gaines is a 75 y.o. female pmh COPD, remote history of tobacco use disorder, chronic sinusitis, chronic cough, history of COVID-19 infection in July 2022 who presents with shortness of breath and weakness.  Patient tells me that since having COVID in July she is really never fully recovered and has been chronically weak.  She was treated for multifocal pneumonia at the end of October and just completed a course of steroids 2 days ago.  She endorses cough that is productive of clear sputum and dyspnea that is rather chronic.  She has been feeling just very fatigued and weak in the last 4 days.  Still eating and drinking but has less appetite.  She denies nausea vomiting diarrhea constipation or urinary symptoms.  Has a nebulizer at home which he uses about once per day.         Past Medical History:  Diagnosis Date   Asthma    Slight   COPD (chronic obstructive pulmonary disease) (HCC)    GERD (gastroesophageal reflux disease)    Glaucoma    Heart murmur    Hyperlipidemia    Hypertension    Macular degeneration    OSA (obstructive sleep apnea)    not on CPAP    Osteoporosis     Patient Active Problem List   Diagnosis Date Noted   RSV (respiratory syncytial virus infection) 09/18/2021   Multifocal pneumonia 09/16/2021   COPD with acute exacerbation ([place]) 09/16/2021   At risk for allergic reaction to medication 09/16/2021   Glaucoma    Closed right hip fracture ([place]) 03/10/2017   Cough variant asthma 01/25/2015   Essential (primary) hypertension 10/28/2013   GERD (gastroesophageal reflux disease) 04/27/2009   DYSPNEA 03/20/2009   HYPERTENSION 01/26/2009   COUGH, CHRONIC  01/26/2009    Past Surgical History:  Procedure Laterality Date   ABDOMINAL HYSTERECTOMY     BREAST CYST ASPIRATION Right YRS AGO   COLONOSCOPY WITH PROPOFOL N/A 07/14/2019   Procedure: COLONOSCOPY WITH PROPOFOL;  Surgeon: Toledo, Benay Pike, MD;  Location: [facility] ENDOSCOPY;  Service: Endoscopy;  Laterality: N/A;   GLAUCOMA SURGERY     x 2    HIP PINNING,CANNULATED Right 03/12/2017   Procedure: CANNULATED HIP PINNING;  Surgeon: Thornton Park, MD;  Location: [facility] ORS;  Service: Orthopedics;  Laterality: Right;   NASAL SINUS SURGERY     x 3    VESICOVAGINAL FISTULA CLOSURE W/ TAH      Prior to Admission medications   Medication Sig Start Date End Date Taking? Authorizing Provider  albuterol (VENTOLIN HFA) 108 (90 Base) MCG/ACT inhaler Inhale 2 puffs into the lungs every 6 (six) hours as needed for wheezing or shortness of breath.    [provider]  amLODipine (NORVASC) 2.5 MG tablet Take 2.5 mg by mouth daily. 06/27/21 06/27/22  [provider]  budesonide-formoterol (SYMBICORT) 160-4.5 MCG/ACT inhaler Inhale 2 puffs into the lungs 2 (two) times daily.    [provider]  citalopram (CELEXA) 10 MG tablet Take 10 mg by mouth daily. 07/11/21   [provider]  dorzolamide-timolol (COSOPT) 22.3-6.8 MG/ML ophthalmic solution Place 1 drop into the right eye 2 (two) times daily.     [provider]  esomeprazole (NEXIUM) 40 MG capsule Take 40 mg by mouth daily. 11/05/16   [provider]  ezetimibe (ZETIA) 10 MG tablet Take 10 mg by mouth daily.    [provider]  FERREX 150 150 MG capsule Take 150 mg by mouth every other day. 09/12/21   [provider]  hydrALAZINE (APRESOLINE) 10 MG tablet Take 1 tablet (10 mg total) by mouth 2 (two) times daily. 02/01/19 02/01/20  Rudene Re, MD  ipratropium-albuterol (DUONEB) 0.5-2.5 (3) MG/3ML SOLN Inhale into the lungs. 06/27/21 06/22/22  [provider]  latanoprost (XALATAN)  0.005 % ophthalmic solution Place 1 drop into the right eye at bedtime.     [provider]  losartan (COZAAR) 50 MG tablet Take 50 mg by mouth daily.    [provider]  meloxicam (MOBIC) 7.5 MG tablet Take 7.5 mg by mouth daily as needed. 05/25/21 05/25/22  [provider]  montelukast (SINGULAIR) 10 MG tablet Take 10 mg by mouth at bedtime.    [provider]  Multiple Vitamins-Minerals (PRESERVISION AREDS) TABS Take 1 tablet by mouth 2 (two) times daily.     [provider]  promethazine-dextromethorphan (PROMETHAZINE-DM) 6.25-15 MG/5ML syrup Take 5 mLs by mouth every 6 (six) hours as needed. 09/18/21   Jennye Boroughs, MD    Allergies Apraclonidine, Cheese, Chocolate flavor, Lac bovis, Brimonidine, Clarithromycin, Clindamycin/lincomycin, Levofloxacin, Nickel, Penicillins, Polytrim [polymyxin b-trimethoprim], Statins, Sulfamethoxazole, Sulfonamide derivatives, and Tobramycin  Family History  Problem Relation Age of Onset   Emphysema Father        smoked   Heart disease Father    Heart disease Brother    Breast cancer Maternal Aunt    Lung cancer Paternal Grandfather        never smoker   Lung cancer Son        never smoker    Social History Social History   Tobacco Use   Smoking status: Former    Packs/day: 1.50    Years: 30.00    Pack years: 45.00    Types: Cigarettes    Quit date: 11/19/1991    Years since quitting: 29.9   Smokeless tobacco: Never  Substance Use Topics   Alcohol use: No    Alcohol/week: 0.0 standard drinks   Drug use: No    Review of Systems   Review of Systems  Constitutional:  Positive for activity change and appetite change. Negative for chills and fever.  Respiratory:  Positive for cough and shortness of breath.   Cardiovascular:  Negative for chest pain.  Gastrointestinal:  Negative for abdominal pain, diarrhea, nausea and vomiting.  Genitourinary:  Negative for dysuria.  All other systems reviewed and  are negative.  Physical Exam Updated Vital Signs BP (!) 121/59 (BP Location: Right Arm)   Pulse (!) 119   Temp (!) 102.1 F (38.9 C) (Oral)   Resp 20   Ht 5\' 1"  (1.549 m)   Wt 60.3 kg   SpO2 96%   BMI 25.13 kg/m   Physical Exam Vitals and nursing note reviewed.  Constitutional:      General: She is not in acute distress.    Appearance: Normal appearance.  HENT:     Head: Normocephalic and atraumatic.  Eyes:     General: No scleral icterus.    Conjunctiva/sclera: Conjunctivae normal.     Comments: Left-sided cataract  Pulmonary:     Effort: Respiratory distress present.     Breath sounds: No stridor.  Comments: Patient is tachypneic but able to speak in full sentences, expiratory wheezing and rhonchi throughout, good air movement Musculoskeletal:        General: No deformity or signs of injury.     Cervical back: Normal range of motion.     Right lower leg: No edema.     Left lower leg: No edema.  Skin:    General: Skin is dry.     Coloration: Skin is not jaundiced or pale.  Neurological:     General: No focal deficit present.     Mental Status: She is alert and oriented to person, place, and time. Mental status is at baseline.  Psychiatric:        Mood and Affect: Mood normal.        Behavior: Behavior normal.     LABS (all labs ordered are listed, but only abnormal results are displayed)  Labs Reviewed  CBC - Abnormal; Notable for the following components:      Result Value   WBC 14.4 (*)    All other components within normal limits  URINALYSIS, COMPLETE (UACMP) WITH MICROSCOPIC - Abnormal; Notable for the following components:   Color, Urine YELLOW (*)    APPearance CLEAR (*)    All other components within normal limits  LACTIC ACID, PLASMA - Abnormal; Notable for the following components:   Lactic Acid, Venous 3.8 (*)    All other components within normal limits  RESP PANEL BY RT-PCR (FLU A&B, COVID) ARPGX2  CULTURE, BLOOD (ROUTINE X 2)  CULTURE,  BLOOD (ROUTINE X 2)  URINE CULTURE  COMPREHENSIVE METABOLIC PANEL  PROCALCITONIN  LACTIC ACID, PLASMA  TROPONIN I (HIGH SENSITIVITY)  TROPONIN I (HIGH SENSITIVITY)   ____________________________________________  EKG  NSR, nml axis, nml intervals, no acute ischemic changes  ____________________________________________  RADIOLOGY Almeta Monas, personally viewed and evaluated these images (plain radiographs) as part of my medical decision making, as well as reviewing the written report by the radiologist.  ED MD interpretation:  I reviewed the CXR which does not show any acute cardiopulmonary process      ____________________________________________   PROCEDURES  Procedure(s) performed (including Critical Care):  Procedures   ____________________________________________   INITIAL IMPRESSION / ASSESSMENT AND PLAN / ED COURSE     75 year old female history of COPD presents with shortness of breath cough and generalized weakness.  Says that she has not really recovered since having COVID in July.  Hospitalized last month for multifocal pneumonia and COPD exacerbation.  Has been feeling progressively weak over the last 4 days with ongoing cough and dyspnea that is chronic.  She has no chest pain fevers chills no other infectious symptoms.  Vital signs are notable for hypertension and tachypnea but no hypoxia.  Patient does appear tachypneic on exam and has wheezing throughout but is moving good air and is not in frank respiratory distress.  Rest of her exam is reassuring.  Her chest x-ray today does not show any focal infiltrate and her CBC BMP and troponin are reassuring.  She does have a leukocytosis of 14 but is recently on steroids.  We will give duo nebs for her COPD.  She just finished a steroid course 2 days ago so would like to avoid further use.   Patient notably spiked a temp to 102.6 while in the ED, she is also tachycardic with a leukocytosis meeting sepsis  criteria.  Blood cultures and lactate obtained.  Lactate is 3.8.  She was given 30  cc/kg fluid, blood pressure remained stable in the ED.  Unclear of the source.  We will send urine for culture sent and will obtain a CT of the chest without contrast given patient had a recent multifocal pneumonia and her symptoms are primarily respiratory.  We will cover with ceftriaxone and Doxy at this time for presumed CAP.  Will require admission to the hospital.  CT chest is read as resolving bibasilar infiltrates with a new left upper lobe infiltrate potentially atypical infection.  Unclear if this is driving her sepsis.  She was covered with ceftriaxone and doxycycline.  Will discuss with hospitalist for admission. Clinical Course as of 10/22/21 1557  Mon Oct 22, 2021  1556  IMPRESSION: There does appear to be improvement involving the bilateral posterior basilar opacities noted on prior exam, suggesting improving atypical infection in this area. However, there appears to be significantly increased nodular opacities in the left upper lobe since prior exam consistent with worsening atypical infection in this area.   Aortic Atherosclerosis (ICD10-I70.0).   [KM]    Clinical Course User Index [KM] Rada Hay, MD     ____________________________________________   FINAL CLINICAL IMPRESSION(S) / ED DIAGNOSES  Final diagnoses:  Sepsis, due to unspecified organism, unspecified whether acute organ dysfunction present [facility])  COPD exacerbation [facility])     ED Discharge Orders     None        Note:  This document was prepared using Dragon voice recognition software and may include unintentional dictation errors.    Rada Hay, MD 10/22/21 1521    Rada Hay, MD 10/22/21 [phone]

## 2021-10-22 NOTE — H&P (Signed)
History and Physical    DOA: 10/22/2021  PCP: Tracie Harrier, MD  Patient coming from: home  Chief Complaint: cough, weakness  HPI: Sonya Gaines is a 75 y.o. female with history h/o COPD, asthma, GERD, hypertension, hyperlipidemia, obstructive sleep apnea (not on CPAP or home O2) who was recently admitted to this [facility] for RSV infection and multilobar pneumonia, subsequently discharged home on 09/18/2021-now presents with complaints of recurrent cough and extreme weakness.  Patient states since last discharge, she was doing okay until 10 days or so later developed congestion and productive cough with whitish phlegm.  Lives alone and denies any sick contacts.  Denies any hemoptysis or chest pain.  Did not notice any fevers but felt extremely weak/drained out and was sleeping most of the time in the last 3 days. ED course: Febrile with T-max 102.72F, tachycardic with heart rate 126, SBP 96-172, respiratory rate 22 and O2 sat 94 to 99% on room air.  WBC 14.4K, hemoglobin 13.9, BUN 18, creatinine 0.73.  Procalcitonin 0.15.  Troponin 3-4.  Lactic acid elevated at 3.8.  Chest x-ray unremarkable but CT chest showed resolving prior bibasilar infiltrates but new left upper lobe infiltrate.  Patient requested to be admitted for further management of sepsis due to recurrent pneumonia   Review of Systems: As per HPI, otherwise review of systems negative.    Past Medical History:  Diagnosis Date   Asthma    Slight   COPD (chronic obstructive pulmonary disease) (HCC)    GERD (gastroesophageal reflux disease)    Glaucoma    Heart murmur    Hyperlipidemia    Hypertension    Macular degeneration    OSA (obstructive sleep apnea)    not on CPAP    Osteoporosis     Past Surgical History:  Procedure Laterality Date   ABDOMINAL HYSTERECTOMY     BREAST CYST ASPIRATION Right YRS AGO   COLONOSCOPY WITH PROPOFOL N/A 07/14/2019   Procedure: COLONOSCOPY WITH PROPOFOL;  Surgeon: Toledo, Benay Pike, MD;  Location: [facility] ENDOSCOPY;  Service: Endoscopy;  Laterality: N/A;   GLAUCOMA SURGERY     x 2    HIP PINNING,CANNULATED Right 03/12/2017   Procedure: CANNULATED HIP PINNING;  Surgeon: Thornton Park, MD;  Location: [facility] ORS;  Service: Orthopedics;  Laterality: Right;   NASAL SINUS SURGERY     x 3    VESICOVAGINAL FISTULA CLOSURE W/ TAH      Social history:  reports that she quit smoking about 29 years ago. Her smoking use included cigarettes. She has a 45.00 pack-year smoking history. She has never used smokeless tobacco. She reports that she does not drink alcohol and does not use drugs.   Allergies  Allergen Reactions   Apraclonidine Rash   Lifitegrast Rash and Swelling    Eye swelling   Cheese Other (See Comments)    Sinus congestion   Chocolate Flavor Other (See Comments)    Sinus congestion   Lac Bovis Other (See Comments)    Sinus congestion    Brimonidine Rash   Clarithromycin Rash   Clindamycin/Lincomycin Swelling and Rash   Levofloxacin Rash   Nickel Rash   Penicillins Rash   Polytrim [Polymyxin B-Trimethoprim] Rash   Statins Rash   Sulfamethoxazole Rash   Sulfonamide Derivatives Rash   Tobramycin Rash    Family History  Problem Relation Age of Onset   Emphysema Father        smoked   Heart disease Father    Heart  disease Brother    Breast cancer Maternal Aunt    Lung cancer Paternal Grandfather        never smoker   Lung cancer Son        never smoker      Prior to Admission medications   Medication Sig Start Date End Date Taking? Authorizing Provider  albuterol (VENTOLIN HFA) 108 (90 Base) MCG/ACT inhaler Inhale 2 puffs into the lungs every 6 (six) hours as needed for wheezing or shortness of breath.   Yes [provider]  amLODipine (NORVASC) 2.5 MG tablet Take 2.5 mg by mouth daily. 06/27/21 06/27/22 Yes [provider]  budesonide-formoterol (SYMBICORT) 160-4.5 MCG/ACT inhaler Inhale 2 puffs into the lungs 2 (two) times  daily.   Yes [provider]  calcium carbonate (OSCAL) 1500 (600 Ca) MG TABS tablet Take 1,500 mg by mouth daily.   Yes [provider]  citalopram (CELEXA) 10 MG tablet Take 10 mg by mouth daily. 07/11/21  Yes [provider]  dorzolamide-timolol (COSOPT) 22.3-6.8 MG/ML ophthalmic solution Place 1 drop into the right eye 2 (two) times daily.    Yes [provider]  esomeprazole (NEXIUM) 40 MG capsule Take 40 mg by mouth daily. 11/05/16  Yes [provider]  ezetimibe (ZETIA) 10 MG tablet Take 10 mg by mouth daily.   Yes [provider]  FERREX 150 150 MG capsule Take 150 mg by mouth every other day. 09/12/21  Yes [provider]  ipratropium-albuterol (DUONEB) 0.5-2.5 (3) MG/3ML SOLN Inhale 3 mLs into the lungs every 4 (four) hours as needed. 06/27/21 06/22/22 Yes [provider]  latanoprost (XALATAN) 0.005 % ophthalmic solution Place 1 drop into the right eye at bedtime.    Yes [provider]  losartan (COZAAR) 50 MG tablet Take 50 mg by mouth daily.   Yes [provider]  meloxicam (MOBIC) 7.5 MG tablet Take 7.5 mg by mouth daily as needed. 05/25/21 05/25/22 Yes [provider]  montelukast (SINGULAIR) 10 MG tablet Take 10 mg by mouth at bedtime.   Yes [provider]  Multiple Vitamins-Minerals (PRESERVISION AREDS) TABS Take 1 tablet by mouth 2 (two) times daily.    Yes [provider]  vitamin B-12 (CYANOCOBALAMIN) 1000 MCG tablet Take 1,000 mcg by mouth daily.   Yes [provider]  hydrALAZINE (APRESOLINE) 10 MG tablet Take 1 tablet (10 mg total) by mouth 2 (two) times daily. Patient not taking: Reported on 10/22/2021 02/01/19 02/01/20  Rudene Re, MD  predniSONE (DELTASONE) 5 MG tablet Take 5 mg by mouth daily. Patient not taking: Reported on 10/22/2021 10/05/21   [provider]  promethazine-dextromethorphan (PROMETHAZINE-DM) 6.25-15 MG/5ML syrup Take 5 mLs  by mouth every 6 (six) hours as needed. Patient not taking: Reported on 10/22/2021 09/18/21   Jennye Boroughs, MD  RHOPRESSA 0.02 % SOLN Place 1 drop into the right eye at bedtime. 10/08/21   [provider]    Physical Exam: Vitals:   10/22/21 1545 10/22/21 1600 10/22/21 1630 10/22/21 1636  BP: (!) 121/59 (!) 117/57 (!) 122/58   Pulse: (!) 118 (!) 114 (!) 111   Resp:      Temp:    98.3 F (36.8 C)  TempSrc:    Oral  SpO2: 96% 95% 94%   Weight:      Height:        Constitutional: NAD, calm, comfortable, no apparent respiratory distress but appears weak Eyes: PERRL, lids and conjunctivae normal ENMT: Mucous membranes  are moist. Posterior pharynx clear of any exudate or lesions.Normal dentition.  Neck: normal, supple, no masses, no thyromegaly Respiratory: clear to auscultation bilaterally, no wheezing, no crackles.  Upper airway congestion noted.  Normal respiratory effort. No accessory muscle use.  Cardiovascular: Regular rate and rhythm, no murmurs / rubs / gallops. No extremity edema. 2+ pedal pulses. No carotid bruits.  Abdomen: no tenderness, no masses palpated. No hepatosplenomegaly. Bowel sounds positive.  Musculoskeletal: no clubbing / cyanosis. No joint deformity upper and lower extremities. Good ROM, no contractures. Normal muscle tone.  Neurologic: CN 2-12 grossly intact. Sensation intact, DTR normal. Strength 5/5 in all 4.  Psychiatric: Normal judgment and insight. Alert and oriented x 3. Normal mood.  SKIN/catheters: no rashes, lesions, ulcers. No induration  Labs on Admission: I have personally reviewed following labs and imaging studies  CBC: Recent Labs  Lab 10/22/21 0936  WBC 14.4*  HGB 13.9  HCT 43.2  MCV 89.1  PLT 937   Basic Metabolic Panel: Recent Labs  Lab 10/22/21 0936  NA 135  K 4.2  CL 101  CO2 28  GLUCOSE 98  BUN 18  CREATININE 0.73  CALCIUM 9.6   GFR: Estimated Creatinine Clearance: 50.6 mL/min (by C-G formula based on SCr of  0.73 mg/dL). Recent Labs  Lab 10/22/21 0936 10/22/21 1400  PROCALCITON  --  0.15  WBC 14.4*  --   LATICACIDVEN  --  3.8*   Liver Function Tests: Recent Labs  Lab 10/22/21 0936  AST 21  ALT 20  ALKPHOS 82  BILITOT 0.5  PROT 7.6  ALBUMIN 4.1   No results for input(s): LIPASE, AMYLASE in the last 168 hours. No results for input(s): AMMONIA in the last 168 hours. Coagulation Profile: No results for input(s): INR, PROTIME in the last 168 hours. Cardiac Enzymes: No results for input(s): CKTOTAL, CKMB, CKMBINDEX, TROPONINI in the last 168 hours. BNP (last 3 results) No results for input(s): PROBNP in the last 8760 hours. HbA1C: No results for input(s): HGBA1C in the last 72 hours. CBG: No results for input(s): GLUCAP in the last 168 hours. Lipid Profile: No results for input(s): CHOL, HDL, LDLCALC, TRIG, CHOLHDL, LDLDIRECT in the last 72 hours. Thyroid Function Tests: No results for input(s): TSH, T4TOTAL, FREET4, T3FREE, THYROIDAB in the last 72 hours. Anemia Panel: No results for input(s): VITAMINB12, FOLATE, FERRITIN, TIBC, IRON, RETICCTPCT in the last 72 hours. Urine analysis:    Component Value Date/Time   COLORURINE YELLOW (A) 10/22/2021 1500   APPEARANCEUR CLEAR (A) 10/22/2021 1500   LABSPEC 1.021 10/22/2021 1500   PHURINE 6.0 10/22/2021 1500   GLUCOSEU NEGATIVE 10/22/2021 1500   HGBUR NEGATIVE 10/22/2021 1500   BILIRUBINUR NEGATIVE 10/22/2021 1500   KETONESUR NEGATIVE 10/22/2021 1500   PROTEINUR NEGATIVE 10/22/2021 1500   NITRITE NEGATIVE 10/22/2021 1500   LEUKOCYTESUR NEGATIVE 10/22/2021 1500    Radiological Exams on Admission: Personally reviewed  DG Chest 2 View  Result Date: 10/22/2021 CLINICAL DATA:  Shortness of breath, pneumonia EXAM: CHEST - 2 VIEW COMPARISON:  09/15/2021.  CT 09/16/2021 FINDINGS: Heart and mediastinal contours are within normal limits. No focal opacities or effusions. No acute bony abnormality. IMPRESSION: No active cardiopulmonary  disease. Electronically Signed   By: Rolm Baptise M.D.   On: 10/22/2021 10:21   CT Chest Wo Contrast  Result Date: 10/22/2021 CLINICAL DATA:  Pneumonia. EXAM: CT CHEST WITHOUT CONTRAST TECHNIQUE: Multidetector CT imaging of the chest was performed following the standard protocol without IV contrast. COMPARISON:  September 16, 2021. FINDINGS: Cardiovascular: Atherosclerosis of thoracic aorta is noted without aneurysm formation. Normal cardiac size. No pericardial effusion. Mild coronary artery calcifications are noted. Mediastinum/Nodes: No enlarged mediastinal or axillary lymph nodes. Thyroid gland, trachea, and esophagus demonstrate no significant findings. Lungs/Pleura: No pneumothorax or pleural effusion is noted. There appears to be improvement involving the nodular opacity seen posteriorly in both lung bases suggesting improving atypical inflammation in this area. However, there appears to be significantly increased nodular opacities involving the left upper lobe concerning for worsening atypical infection in this area. Upper Abdomen: No acute abnormality. Musculoskeletal: No chest wall mass or suspicious bone lesions identified. IMPRESSION: There does appear to be improvement involving the bilateral posterior basilar opacities noted on prior exam, suggesting improving atypical infection in this area. However, there appears to be significantly increased nodular opacities in the left upper lobe since prior exam consistent with worsening atypical infection in this area. Aortic Atherosclerosis (ICD10-I70.0). Electronically Signed   By: Marijo Conception M.D.   On: 10/22/2021 15:47    EKG: Independently reviewed.  Normal sinus rhythm, left atrial enlargement, QTC WNL     Assessment and Plan:   Principal Problem:   Pneumonia  1.Left upper lobe pneumonia: CT as above indicated worsening of atypical infection in LUL compared to prior although improvement noted in bibasilar lobes.  Patient met sepsis  criteria with fever, leukocytosis, tachycardia and lactic acidosis.  Blood pressure has been fluctuating.  Overall appears weak but hemodynamically stable currently.   Blood culture sent.  Admit and continue IV antibiotics started in the ED-Rocephin/doxycycline.  Not sure if we should treat as HCAP given recent hospitalization as CT findings show partial improvement and procalcitonin not significantly elevated-Will obtain MRSA screen and sputum culture if possible.  If no improvement or sputum culture indicates otherwise, can escalate antibiotic regimen.  Given recurrent episode after recent discharge wonder if patient has underlying predisposition due to sleep apnea/aspiration versus recurrent viral infection.COVID/flu test negative.   Will obtain speech/swallow evaluation.  Patient reports being up-to-date with flu shot and pneumonia shot this year.  She states she could not get the booster shot for COVID as she had COVID infection in July.  2.  COPD/asthma: Resume home medications/nebulizer treatments.  Antibiotics should cover bronchitis.  3.  Sleep apnea: Not on CPAP at home.  May benefit from follow-up pulmonary appointment.  4.  Hypertension, hyperlipidemia: Resume home meds   5. Depression/anxiety: Resume citalopram  DVT prophylaxis: Lovenox  COVID screen: Negative  Code Status: Full code as confirmed with patient   .Health care proxy would be her daughter [name]  Patient/Family Communication: Discussed with patient and all questions answered to satisfaction.  Consults called: None Admission status :Patient will be admitted under OBSERVATION status.The patient's presenting symptoms, physical exam findings, and initial radiographic and laboratory data in the context of their medical condition is felt to place them at low risk for further clinical deterioration. Furthermore, it is anticipated that the patient will be medically stable for discharge from the hospital within 2 midnights of hospital  stay.       Guilford Shi MD [facility] Pager in [place]  If 7PM-7AM, please contact night-coverage [email or web address]   10/22/2021, 7:31 PM

## 2021-10-22 NOTE — ED Notes (Signed)
See triage note. SOB since July, had PNA, been treated several times recently for PNA. Currently getting duoneb.

## 2021-10-22 NOTE — ED Notes (Signed)
Informed EDP lactic 3.8.

## 2021-10-22 NOTE — ED Notes (Signed)
Patient taken to CT scan.

## 2021-10-22 NOTE — ED Notes (Signed)
SQ printer not working, will send blood work with chart labels.

## 2021-10-22 NOTE — ED Notes (Signed)
Patient states she is allergic to the "mycins:. Pharmacist Clarissa was informed and she is going to message the MD.

## 2021-10-22 NOTE — ED Notes (Signed)
Patient ambulated to hallway bathroom with a steady gait. 

## 2021-10-22 NOTE — ED Triage Notes (Signed)
Pt in with co shob states was dx with pneumonia since July states has been on different rounds on antibiotics and steroids. States has also been admitted for the same, pt here for persistent shob and cough.

## 2021-10-23 ENCOUNTER — Encounter: Payer: Self-pay | Admitting: Internal Medicine

## 2021-10-23 DIAGNOSIS — R0602 Shortness of breath: Secondary | ICD-10-CM | POA: Diagnosis present

## 2021-10-23 DIAGNOSIS — Z825 Family history of asthma and other chronic lower respiratory diseases: Secondary | ICD-10-CM | POA: Diagnosis not present

## 2021-10-23 DIAGNOSIS — M810 Age-related osteoporosis without current pathological fracture: Secondary | ICD-10-CM | POA: Diagnosis present

## 2021-10-23 DIAGNOSIS — Z801 Family history of malignant neoplasm of trachea, bronchus and lung: Secondary | ICD-10-CM | POA: Diagnosis not present

## 2021-10-23 DIAGNOSIS — E785 Hyperlipidemia, unspecified: Secondary | ICD-10-CM | POA: Diagnosis present

## 2021-10-23 DIAGNOSIS — Z8616 Personal history of COVID-19: Secondary | ICD-10-CM | POA: Diagnosis not present

## 2021-10-23 DIAGNOSIS — Z20822 Contact with and (suspected) exposure to covid-19: Secondary | ICD-10-CM | POA: Diagnosis present

## 2021-10-23 DIAGNOSIS — F419 Anxiety disorder, unspecified: Secondary | ICD-10-CM | POA: Diagnosis present

## 2021-10-23 DIAGNOSIS — Z7951 Long term (current) use of inhaled steroids: Secondary | ICD-10-CM | POA: Diagnosis not present

## 2021-10-23 DIAGNOSIS — A419 Sepsis, unspecified organism: Secondary | ICD-10-CM | POA: Diagnosis present

## 2021-10-23 DIAGNOSIS — Z888 Allergy status to other drugs, medicaments and biological substances status: Secondary | ICD-10-CM | POA: Diagnosis not present

## 2021-10-23 DIAGNOSIS — F32A Depression, unspecified: Secondary | ICD-10-CM | POA: Diagnosis present

## 2021-10-23 DIAGNOSIS — J440 Chronic obstructive pulmonary disease with acute lower respiratory infection: Secondary | ICD-10-CM | POA: Diagnosis present

## 2021-10-23 DIAGNOSIS — Z881 Allergy status to other antibiotic agents status: Secondary | ICD-10-CM | POA: Diagnosis not present

## 2021-10-23 DIAGNOSIS — Z8249 Family history of ischemic heart disease and other diseases of the circulatory system: Secondary | ICD-10-CM | POA: Diagnosis not present

## 2021-10-23 DIAGNOSIS — I10 Essential (primary) hypertension: Secondary | ICD-10-CM | POA: Diagnosis present

## 2021-10-23 DIAGNOSIS — K219 Gastro-esophageal reflux disease without esophagitis: Secondary | ICD-10-CM | POA: Diagnosis present

## 2021-10-23 DIAGNOSIS — H409 Unspecified glaucoma: Secondary | ICD-10-CM | POA: Diagnosis present

## 2021-10-23 DIAGNOSIS — J189 Pneumonia, unspecified organism: Secondary | ICD-10-CM | POA: Diagnosis present

## 2021-10-23 DIAGNOSIS — G4733 Obstructive sleep apnea (adult) (pediatric): Secondary | ICD-10-CM | POA: Diagnosis present

## 2021-10-23 DIAGNOSIS — Z882 Allergy status to sulfonamides status: Secondary | ICD-10-CM | POA: Diagnosis not present

## 2021-10-23 DIAGNOSIS — Z803 Family history of malignant neoplasm of breast: Secondary | ICD-10-CM | POA: Diagnosis not present

## 2021-10-23 DIAGNOSIS — Z79899 Other long term (current) drug therapy: Secondary | ICD-10-CM | POA: Diagnosis not present

## 2021-10-23 DIAGNOSIS — Z880 Allergy status to penicillin: Secondary | ICD-10-CM | POA: Diagnosis not present

## 2021-10-23 DIAGNOSIS — Z91018 Allergy to other foods: Secondary | ICD-10-CM | POA: Diagnosis not present

## 2021-10-23 LAB — HIV ANTIBODY (ROUTINE TESTING W REFLEX): HIV Screen 4th Generation wRfx: NONREACTIVE

## 2021-10-23 LAB — LACTIC ACID, PLASMA: Lactic Acid, Venous: 0.8 mmol/L (ref 0.5–1.9)

## 2021-10-23 MED ORDER — SODIUM CHLORIDE 0.9 % IV SOLN
1.0000 g | Freq: Once | INTRAVENOUS | Status: AC
  Administered 2021-10-23: 1 g via INTRAVENOUS
  Filled 2021-10-23: qty 10

## 2021-10-23 NOTE — Evaluation (Signed)
Clinical/Bedside Swallow Evaluation Patient Details  Name: Sonya Gaines MRN: [id number] Date of Birth: 09-21-1946  Today's Date: 10/23/2021 Time: SLP Start Time (ACUTE ONLY): 22 SLP Stop Time (ACUTE ONLY): 1555 SLP Time Calculation (min) (ACUTE ONLY): 45 min  Past Medical History:  Past Medical History:  Diagnosis Date   Asthma    Slight   COPD (chronic obstructive pulmonary disease) (HCC)    GERD (gastroesophageal reflux disease)    Glaucoma    Heart murmur    Hyperlipidemia    Hypertension    Macular degeneration    OSA (obstructive sleep apnea)    not on CPAP    Osteoporosis    Past Surgical History:  Past Surgical History:  Procedure Laterality Date   ABDOMINAL HYSTERECTOMY     BREAST CYST ASPIRATION Right YRS AGO   COLONOSCOPY WITH PROPOFOL N/A 07/14/2019   Procedure: COLONOSCOPY WITH PROPOFOL;  Surgeon: Toledo, Benay Pike, MD;  Location: [facility] ENDOSCOPY;  Service: Endoscopy;  Laterality: N/A;   GLAUCOMA SURGERY     x 2    HIP PINNING,CANNULATED Right 03/12/2017   Procedure: CANNULATED HIP PINNING;  Surgeon: Thornton Park, MD;  Location: [facility] ORS;  Service: Orthopedics;  Laterality: Right;   NASAL SINUS SURGERY     x 3    VESICOVAGINAL FISTULA CLOSURE W/ TAH     HPI:  Pt is a 75 y.o. female with history h/o COPD, asthma, GERD, hypertension, hyperlipidemia, obstructive sleep apnea (not on CPAP or home O2) who was recently admitted to this [facility] for RSV infection and multilobar pneumonia, subsequently discharged home on 09/18/2021-now presents with complaints of recurrent cough and extreme weakness.  Patient states since last discharge, she was doing okay until 10 days or so later developed congestion and productive cough with whitish phlegm.  Lives alone and denies any sick contacts.  Denies any hemoptysis or chest pain.  Did not notice any fevers but felt extremely weak/drained out and was sleeping most of the time in the last 3 days. Pt stated she has had "more  pneumonia since having Covid19".   CT of Chest: There does appear to be improvement involving the bilateral  posterior basilar opacities noted on prior exam, suggesting  improving atypical infection in this area. However, there appears to  be significantly increased nodular opacities in the left upper lobe  since prior exam consistent with worsening atypical infection in  this area. Aortic Atherosclerosis.    Assessment / Plan / Recommendation  Clinical Impression  Pt appears to present w/ adequate oropharyngeal phase swallow function w/ No oropharyngeal phase dysphagia noted, No neuromuscular deficits noted. Pt has No h/o CVA or other neurological deficits per chart/report. Pt consumed po trials w/ No overt, clinical s/s of aspiration during po trials. Pt appears at reduced risk for aspiration following general aspiration precautions.     During po trials, pt consumed all consistencies w/ no overt coughing, decline in vocal quality(raspy), or change in respiratory presentation during/post trials. O2 sats remained 98%. Oral phase appeared [facility] w/ timely bolus management, mastication, and control of bolus propulsion for A-P transfer for swallowing. Oral clearing achieved w/ all trial consistencies. OM Exam appeared [facility] w/ no unilateral weakness noted. Speech Clear. Vocal quality c/b raspy, hoarse quality -- unsure if related to her baseline airway issues. Recommend f/u w/ ENT for direct viewing and management/education. Pt stated she has had several episodes of pneumonia "since having Covid10". Pt fed self w/ setup support.     Recommend continue  a Regular consistency diet w/ moistened foods; Thin liquids. Recommend general aspiration precautions. Education given on Pills in Puree if needed; food consistencies; general aspiration precautions. NSG to reconsult if any new needs arise. NSG agreed. pt may benefit from Outpatient f/u w/ the Cardiopulmonary exercise group at [facility]; MD updated. SLP Visit Diagnosis:  Dysphagia, unspecified (R13.10)    Aspiration Risk   (reduced)    Diet Recommendation   Regular consistency diet w/ moistened foods; Thin liquids. Recommend general aspiration precautions.  Medication Administration: Whole meds with liquid    Other  Recommendations Recommended Consults: Consider ENT evaluation (for vocal quality assessment) Oral Care Recommendations: Oral care BID;Patient independent with oral care Other Recommendations:  (n/a)    Recommendations for follow up therapy are one component of a multi-disciplinary discharge planning process, led by the attending physician.  Recommendations may be updated based on patient status, additional functional criteria and insurance authorization.  Follow up Recommendations No SLP follow up      Assistance Recommended at Discharge None  Functional Status Assessment Patient has had a recent decline in their functional status and demonstrates the ability to make significant improvements in function in a reasonable and predictable amount of time.  Frequency and Duration  (n/a)   (n/a)       Prognosis Prognosis for Safe Diet Advancement: Good Barriers to Reach Goals:  (n/a)      Swallow Study   General Date of Onset: 10/22/21 HPI: Pt is a 75 y.o. female with history h/o COPD, asthma, GERD, hypertension, hyperlipidemia, obstructive sleep apnea (not on CPAP or home O2) who was recently admitted to this [facility] for RSV infection and multilobar pneumonia, subsequently discharged home on 09/18/2021-now presents with complaints of recurrent cough and extreme weakness.  Patient states since last discharge, she was doing okay until 10 days or so later developed congestion and productive cough with whitish phlegm.  Lives alone and denies any sick contacts.  Denies any hemoptysis or chest pain.  Did not notice any fevers but felt extremely weak/drained out and was sleeping most of the time in the last 3 days. Pt stated she has had "more  pneumonia since having Covid19".   CT of Chest: There does appear to be improvement involving the bilateral  posterior basilar opacities noted on prior exam, suggesting  improving atypical infection in this area. However, there appears to  be significantly increased nodular opacities in the left upper lobe  since prior exam consistent with worsening atypical infection in  this area. Aortic Atherosclerosis. Type of Study: Bedside Swallow Evaluation Previous Swallow Assessment: none Diet Prior to this Study: Regular;Thin liquids Temperature Spikes Noted: No (wbc elevated) Respiratory Status: Room air History of Recent Intubation: No Behavior/Cognition: Alert;Cooperative;Pleasant mood Oral Cavity Assessment: Within Functional Limits Oral Care Completed by SLP: Recent completion by staff Oral Cavity - Dentition: Adequate natural dentition Vision: Functional for self-feeding Self-Feeding Abilities: Able to feed self Patient Positioning: Upright in bed Baseline Vocal Quality: Hoarse (raspy) Volitional Cough: Strong;Congested (min) Volitional Swallow: Able to elicit    Oral/Motor/Sensory Function Overall Oral Motor/Sensory Function: Within functional limits   Ice Chips Ice chips: Not tested   Thin Liquid Thin Liquid: Within functional limits Presentation: Self Fed;Straw (~4 ozs)    Nectar Thick Nectar Thick Liquid: Not tested   Honey Thick Honey Thick Liquid: Not tested   Puree Puree: Within functional limits Presentation: Self Fed;Spoon (3 trials)   Solid     Solid: Within functional limits Presentation: Self Fed (  4 trials)         Orinda Kenner, MS, CCC-SLP Speech Language Pathologist Rehab Services [phone] [place] Hargan 10/23/2021,4:46 PM

## 2021-10-23 NOTE — Progress Notes (Signed)
PROGRESS NOTE  Sonya Gaines  DOB: [birth year]  PCP: Tracie Harrier, MD OVZ:[id number]  DOA: 10/22/2021  LOS: 0 days  Hospital Day: 2  Chief Complaint  Sonya Gaines presents with   Shortness of Breath   Brief narrative: Sonya Gaines is a 74 y.o. female with PMH significant for HTN, HLD, OSA, COPD, GERD, osteoporosis who was recently hospitalized for RSV infection multilobar pneumonia, discharged home on 11/1, lives alone. Sonya Gaines presented to the ED on 12/5 with complaint of recurrent cough, worsening fatigue ongoing for last 3 weeks.  No fever.  For the last 3 days, Sonya Gaines was feeling extremely weak and was sleeping most of the time.  In the ED, Sonya Gaines had a fever of 102.6, tachycardic to 126. Labs with WC count 14.4, hemoglobin 13.3, platelet 303, BMP unremarkable Troponin negative, lactic acid elevated to 3.8, procalcitonin 0.15 Respiratory virus panel negative. CT chest showed resolving bibasilar infiltrates and new left upper lobe infiltrate. Admitted to hospitalist service for further evaluation management  Subjective: Sonya Gaines was seen and examined this morning.  Pleasant elderly Caucasian female.  Lying down in bed.  Not in distress.  No new symptoms.  Not on supplemental oxygen. Chart reviewed No fever since 4 PM yesterday afternoon.  Assessment/Plan: Sepsis secondary to pneumonia -Presented with 3 weeks of progressive fatigue, cough, significantly worse for last 3 days -CT chest with resolving bibasilar infiltrates and new left upper lobe infiltrate. -Currently on IV Rocephin and doxycycline. -Pending blood culture, sputum culture -Lactic acid level normalized.  No fever since yesterday afternoon.   -Trend temperature trend, WBC count and procalcitonin level. -Not requiring supplemental oxygen. Recent Labs  Lab 10/22/21 0936 10/22/21 1400 10/23/21 0527  WBC 14.4*  --   --   LATICACIDVEN  --  3.8* 0.8  PROCALCITON  --  0.15  --    COPD -Home meds include  Symbicort, albuterol, as needed DuoNeb,  Sleep apnea -Does not use CPAP at home  Essential hypertension -All meds include losartan 50 mg daily, amlodipine 2.5 mg daily  Hyperlipidemia -Zetia  Depression/anxiety -Citalopram  GERD -Protonix, iron supplement Recent Labs    09/15/21 1807 09/16/21 0656 09/17/21 0534 09/18/21 0846 10/22/21 0936  HGB 12.6 12.4 11.3* 11.3* 13.9  MCV 86.7 86.4 86.3 86.9 89.1   Mobility: Encourage ambulation Living condition: Lives alone Goals of care:   Code Status: Full Code  Nutritional status: Body mass index is 25.13 kg/m.      Diet:  Diet Order             Diet Heart Room service appropriate? Yes; Fluid consistency: Thin  Diet effective now                  DVT prophylaxis:  enoxaparin (LOVENOX) injection 40 mg Start: 10/23/21 0019 SCDs Start: 10/22/21 1927   Antimicrobials: IV Rocephin and doxycycline Fluid: Normal saline at 44 mill per hour Consultants: None Family Communication: None at bedside  Status is: Observation  Continue in-hospital care because: Needs IV antibiotics Level of care: Telemetry Medical   Dispo: The Sonya Gaines is from: Home              Anticipated d/c is to: Hopefully home in 1 to 2 days, pending PT              Sonya Gaines currently is not medically stable to d/c.   Difficult to place Sonya Gaines No     Infusions:   sodium chloride 75 mL/hr at 10/23/21 0416  cefTRIAXone (ROCEPHIN)  IV      Scheduled Meds:  amLODipine  2.5 mg Oral Daily   calcium carbonate  1,500 mg Oral Daily   citalopram  10 mg Oral Daily   dorzolamide-timolol  1 drop Right Eye BID   doxycycline  100 mg Oral Q12H   enoxaparin (LOVENOX) injection  40 mg Subcutaneous Q24H   ezetimibe  10 mg Oral Daily   fluticasone furoate-vilanterol  1 puff Inhalation Daily   iron polysaccharides  150 mg Oral QODAY   latanoprost  1 drop Right Eye QHS   losartan  50 mg Oral Daily   montelukast  10 mg Oral QHS   multivitamin-lutein  1  capsule Oral BID   pantoprazole  40 mg Oral Daily   vitamin B-12  1,000 mcg Oral Daily    PRN meds: ipratropium-albuterol   Antimicrobials: Anti-infectives (From admission, onward)    Start     Dose/Rate Route Frequency Ordered Stop   10/23/21 1500  cefTRIAXone (ROCEPHIN) 2 g in sodium chloride 0.9 % 100 mL IVPB        2 g 200 mL/hr over 30 Minutes Intravenous Every 24 hours 10/22/21 1929 10/27/21 1459   10/23/21 0030  doxycycline (VIBRA-TABS) tablet 100 mg        100 mg Oral Every 12 hours 10/22/21 1929     10/23/21 0030  cefTRIAXone (ROCEPHIN) 1 g in sodium chloride 0.9 % 100 mL IVPB       Note to Pharmacy: Give following initial 1 gm dose to make total starting dose of 2 gm.   1 g 200 mL/hr over 30 Minutes Intravenous  Once 10/23/21 0020 10/23/21 0414   10/22/21 1615  vancomycin (VANCOREADY) IVPB 1500 mg/300 mL        1,500 mg 150 mL/hr over 120 Minutes Intravenous  Once 10/22/21 1614 10/22/21 1938   10/22/21 1515  cefTRIAXone (ROCEPHIN) 1 g in sodium chloride 0.9 % 100 mL IVPB        1 g 200 mL/hr over 30 Minutes Intravenous  Once 10/22/21 1508 10/22/21 1636   10/22/21 1515  doxycycline (VIBRA-TABS) tablet 100 mg        100 mg Oral  Once 10/22/21 1508 10/22/21 1522       Objective: Vitals:   10/23/21 1100 10/23/21 1144  BP: 120/72 125/80  Pulse:  75  Resp:  14  Temp: 98.2 F (36.8 C) 98.4 F (36.9 C)  SpO2:  98%    Intake/Output Summary (Last 24 hours) at 10/23/2021 1308 Last data filed at 10/22/2021 1938 Gross per 24 hour  Intake 2400.38 ml  Output --  Net 2400.38 ml   Filed Weights   10/22/21 0927  Weight: 60.3 kg   Weight change:  Body mass index is 25.13 kg/m.   Physical Exam: General exam: Pleasant, elderly Caucasian female.  Not in physical distress Skin: No rashes, lesions or ulcers. HEENT: Atraumatic, normocephalic, no obvious bleeding Lungs: Clear to auscultation bilaterally CVS: Regular rate and rhythm, no murmur GI/Abd soft, nontender,  nondistended, bowel sound present CNS: Alert, awake, oriented x3 Psychiatry: Mood appropriate Extremities: No pedal edema, no calf tenderness  Data Review: I have personally reviewed the laboratory data and studies available.  F/u labs ordered Unresulted Labs (From admission, onward)     Start     Ordered   10/29/21 0500  Creatinine, serum  (enoxaparin (LOVENOX)    CrCl >/= 30 ml/min)  Weekly,   STAT     Comments: while  on enoxaparin therapy    10/22/21 1929   10/24/21 0500  CBC with Differential/Platelet  Daily,   R      10/23/21 1308   10/24/21 3241  Basic metabolic panel  Daily,   R      10/23/21 1308   10/22/21 1929  MRSA Next Gen by PCR, Nasal  (Non-severe pneumonia (non-ICU care) in adult without resistant organism risk factors.)  Once,   STAT        10/22/21 1929   10/22/21 1510  Urine Culture  ONCE - STAT,   STAT       Question:  Indication  Answer:  Dysuria   10/22/21 1509            Signed, Terrilee Croak, MD [facility] 10/23/2021

## 2021-10-24 LAB — BASIC METABOLIC PANEL
Anion gap: 6 (ref 5–15)
BUN: 9 mg/dL (ref 8–23)
CO2: 23 mmol/L (ref 22–32)
Calcium: 8.5 mg/dL — ABNORMAL LOW (ref 8.9–10.3)
Chloride: 108 mmol/L (ref 98–111)
Creatinine, Ser: 0.56 mg/dL (ref 0.44–1.00)
GFR, Estimated: >60 mL/min (ref >60)
Glucose, Bld: 97 mg/dL (ref 70–99)
Potassium: 3.5 mmol/L (ref 3.5–5.1)
Sodium: 137 mmol/L (ref 135–145)

## 2021-10-24 LAB — CBC WITH DIFFERENTIAL/PLATELET
Abs Immature Granulocytes: 0.04 10*3/uL (ref 0.00–0.07)
Basophils Absolute: 0 10*3/uL (ref 0.0–0.1)
Basophils Relative: 1 %
Eosinophils Absolute: 0.1 10*3/uL (ref 0.0–0.5)
Eosinophils Relative: 1 %
HCT: 30.5 % — ABNORMAL LOW (ref 36.0–46.0)
Hemoglobin: 10.1 g/dL — ABNORMAL LOW (ref 12.0–15.0)
Immature Granulocytes: 1 %
Lymphocytes Relative: 22 %
Lymphs Abs: 1.8 10*3/uL (ref 0.7–4.0)
MCH: 28.7 pg (ref 26.0–34.0)
MCHC: 33.1 g/dL (ref 30.0–36.0)
MCV: 86.6 fL (ref 80.0–100.0)
Monocytes Absolute: 0.5 10*3/uL (ref 0.1–1.0)
Monocytes Relative: 7 %
Neutro Abs: 5.6 10*3/uL (ref 1.7–7.7)
Neutrophils Relative %: 68 %
Platelets: 195 10*3/uL (ref 150–400)
RBC: 3.52 MIL/uL — ABNORMAL LOW (ref 3.87–5.11)
RDW: 14.1 % (ref 11.5–15.5)
WBC: 8 10*3/uL (ref 4.0–10.5)
nRBC: 0 % (ref 0.0–0.2)

## 2021-10-24 LAB — URINE CULTURE: Culture: NO GROWTH

## 2021-10-24 NOTE — TOC Initial Note (Signed)
Transition of Care [facility]) - Initial/Assessment Note    Patient Details  Name: Sonya Gaines MRN: [id number] Date of Birth: 1946-03-05  Transition [facility]) CM/SW Contact:    Kerin Salen, RN Phone Number: 10/24/2021, 11:24 AM  Clinical Narrative: TOCRN spoke with patient who indicated that she lives alone, drives herself to medical visits, [place], and able to do cooking and shopping. Independent with ADL's use no assisted devices. Do have nebulizer machine at home no oxygen. Sister will live with her for a while. No TOC needs assessed at this time, will continue to track for discharge needs.                  Expected Discharge Plan: Home/Self Care Barriers to Discharge: Continued Medical Work up   Patient Goals and CMS Choice Patient states their goals for this hospitalization and ongoing recovery are:: To return home, sister will stay with her for a while.   Choice offered to / list presented to : NA  Expected Discharge Plan and Services Expected Discharge Plan: Home/Self Care       Living arrangements for the past 2 months: Single Family Home                                      Prior Living Arrangements/Services Living arrangements for the past 2 months: Single Family Home Lives with:: Self Patient language and need for interpreter reviewed:: Yes Do you feel safe going back to the place where you live?: Yes      Need for Family Participation in Patient Care: No (Comment) Care giver support system in place?: Yes (comment)      Activities of Daily Living Home Assistive Devices/Equipment: None ADL Screening (condition at time of admission) Patient's cognitive ability adequate to safely complete daily activities?: Yes Is the patient deaf or have difficulty hearing?: No Does the patient have difficulty seeing, even when wearing glasses/contacts?: Yes Does the patient have difficulty concentrating, remembering, or making decisions?:  No Patient able to express need for assistance with ADLs?: Yes Does the patient have difficulty dressing or bathing?: No Independently performs ADLs?: Yes (appropriate for developmental age) Does the patient have difficulty walking or climbing stairs?: No Weakness of Legs: None Weakness of Arms/Hands: None  Permission Sought/Granted Permission sought to share information with : Case Manager                Emotional Assessment Appearance:: Appears stated age Attitude/Demeanor/Rapport: Engaged Affect (typically observed): Accepting Orientation: : Oriented to Place, Oriented to Self, Oriented to  Time, Oriented to Situation Alcohol / Substance Use: Not Applicable Psych Involvement: No (comment)  Admission diagnosis:  Pneumonia [J18.9] COPD exacerbation (HCC) [J44.1] Sepsis, due to unspecified organism, unspecified whether acute organ dysfunction present [facility]) [A41.9] Patient Active Problem List   Diagnosis Date Noted   Pneumonia 10/22/2021   RSV (respiratory syncytial virus infection) 09/18/2021   Multifocal pneumonia 09/16/2021   COPD with acute exacerbation ([place]) 09/16/2021   At risk for allergic reaction to medication 09/16/2021   Glaucoma    Closed right hip fracture ([place]) 03/10/2017   Cough variant asthma 01/25/2015   Essential (primary) hypertension 10/28/2013   GERD (gastroesophageal reflux disease) 04/27/2009   DYSPNEA 03/20/2009   Essential hypertension 01/26/2009   COUGH, CHRONIC 01/26/2009   PCP:  Tracie Harrier, MD Pharmacy:   [place], [place] -  [address]  [place] [address] Phone: [phone] Fax: [phone]     Social Determinants of Health (SDOH) Interventions    Readmission Risk Interventions No flowsheet data found.

## 2021-10-24 NOTE — Progress Notes (Signed)
PROGRESS NOTE  Sonya Gaines  DOB: [birth year]  PCP: Tracie Harrier, MD WCB:[id number]  DOA: 10/22/2021  LOS: 1 day  Hospital Day: 3  Chief Complaint  Patient presents with   Shortness of Breath   Brief narrative: Sonya Gaines is a 75 y.o. female with PMH significant for HTN, HLD, OSA, COPD, GERD, osteoporosis who was recently hospitalized for RSV infection multilobar pneumonia, discharged home on 11/1, lives alone. Patient presented to the ED on 12/5 with complaint of recurrent cough, worsening fatigue ongoing for last 3 weeks.  No fever.  For the last 3 days, patient was feeling extremely weak and was sleeping most of the time.  In the ED, patient had a fever of 102.6, tachycardic to 126. Labs with WC count 14.4, hemoglobin 13.3, platelet 303, BMP unremarkable Troponin negative, lactic acid elevated to 3.8, procalcitonin 0.15 Respiratory virus panel negative. CT chest showed resolving bibasilar infiltrates and new left upper lobe infiltrate. Admitted to hospitalist service for further evaluation management  Subjective: Patient was seen and examined this morning.  Propped up in bed.  Not in distress.  Not in supplemental oxygen.  States she is coughing up phlegm.  On deep breathing, she has less cough today.   No recurrence of fever in last 36 hours.  Assessment/Plan: Sepsis secondary to pneumonia -Presented with 3 weeks of progressive fatigue, cough, significantly worse for 3 days prior to presentation -CT chest with resolving bibasilar infiltrates and new left upper lobe infiltrate. -Clinically improving on IV Rocephin and doxycycline. -No protein blood culture so far. -No fever in 36 hours.  WBC normalized, lactic acid level normalized.  Not on supplemental oxygen at rest -Check ambulatory oxygen requirement. -Patient wants to see pulmonologist as an outpatient.  Will order for ambulatory referral at discharge. Recent Labs  Lab 10/22/21 0936 10/22/21 1400  10/23/21 0527 10/24/21 0408  WBC 14.4*  --   --  8.0  LATICACIDVEN  --  3.8* 0.8  --   PROCALCITON  --  0.15  --   --    COPD -Home meds include Symbicort, albuterol, as needed DuoNeb  Sleep apnea -Does not use CPAP at home  Essential hypertension -Home meds include losartan 50 mg daily, amlodipine 2.5 mg daily -Continue the same.  Hyperlipidemia -Zetia  Depression/anxiety -Citalopram  GERD -Protonix, iron supplement Recent Labs    09/16/21 0656 09/17/21 0534 09/18/21 0846 10/22/21 0936 10/24/21 0408  HGB 12.4 11.3* 11.3* 13.9 10.1*  MCV 86.4 86.3 86.9 89.1 86.6   Mobility: Encourage ambulation Living condition: Lives alone Goals of care:   Code Status: Full Code  Nutritional status: Body mass index is 25.13 kg/m.      Diet:  Diet Order             Diet Heart Room service appropriate? Yes; Fluid consistency: Thin  Diet effective now                  DVT prophylaxis:  enoxaparin (LOVENOX) injection 40 mg Start: 10/23/21 0019 SCDs Start: 10/22/21 1927   Antimicrobials: IV Rocephin and doxycycline Fluid: Stop IV hydration Consultants: None Family Communication: None at bedside  Status is: Observation  Continue in-hospital care because: Clinically improving gradually Level of care: Telemetry Medical   Dispo: The patient is from: Home              Anticipated d/c is to: Hopefully home tomorrow              Patient currently  is not medically stable to d/c.   Difficult to place patient No     Infusions:   cefTRIAXone (ROCEPHIN)  IV Stopped (10/23/21 2300)    Scheduled Meds:  amLODipine  2.5 mg Oral Daily   calcium carbonate  1,500 mg Oral Daily   citalopram  10 mg Oral Daily   dorzolamide-timolol  1 drop Right Eye BID   doxycycline  100 mg Oral Q12H   enoxaparin (LOVENOX) injection  40 mg Subcutaneous Q24H   ezetimibe  10 mg Oral Daily   fluticasone furoate-vilanterol  1 puff Inhalation Daily   iron polysaccharides  150 mg Oral QODAY    latanoprost  1 drop Right Eye QHS   losartan  50 mg Oral Daily   montelukast  10 mg Oral QHS   multivitamin-lutein  1 capsule Oral BID   pantoprazole  40 mg Oral Daily   vitamin B-12  1,000 mcg Oral Daily    PRN meds: ipratropium-albuterol   Antimicrobials: Anti-infectives (From admission, onward)    Start     Dose/Rate Route Frequency Ordered Stop   10/23/21 1500  cefTRIAXone (ROCEPHIN) 2 g in sodium chloride 0.9 % 100 mL IVPB        2 g 200 mL/hr over 30 Minutes Intravenous Every 24 hours 10/22/21 1929 10/27/21 1459   10/23/21 0030  doxycycline (VIBRA-TABS) tablet 100 mg        100 mg Oral Every 12 hours 10/22/21 1929     10/23/21 0030  cefTRIAXone (ROCEPHIN) 1 g in sodium chloride 0.9 % 100 mL IVPB       Note to Pharmacy: Give following initial 1 gm dose to make total starting dose of 2 gm.   1 g 200 mL/hr over 30 Minutes Intravenous  Once 10/23/21 0020 10/23/21 0414   10/22/21 1615  vancomycin (VANCOREADY) IVPB 1500 mg/300 mL        1,500 mg 150 mL/hr over 120 Minutes Intravenous  Once 10/22/21 1614 10/22/21 1938   10/22/21 1515  cefTRIAXone (ROCEPHIN) 1 g in sodium chloride 0.9 % 100 mL IVPB        1 g 200 mL/hr over 30 Minutes Intravenous  Once 10/22/21 1508 10/22/21 1636   10/22/21 1515  doxycycline (VIBRA-TABS) tablet 100 mg        100 mg Oral  Once 10/22/21 1508 10/22/21 1522       Objective: Vitals:   10/24/21 0633 10/24/21 0754  BP: (!) 142/76 (!) 144/70  Pulse: 77 78  Resp: 17 18  Temp: 98 F (36.7 C) 97.9 F (36.6 C)  SpO2: 97% 97%    Intake/Output Summary (Last 24 hours) at 10/24/2021 1152 Last data filed at 10/24/2021 1020 Gross per 24 hour  Intake 360 ml  Output --  Net 360 ml   Filed Weights   10/22/21 0927  Weight: 60.3 kg   Weight change:  Body mass index is 25.13 kg/m.   Physical Exam: General exam: Pleasant, elderly Caucasian female.  Not in physical distress Skin: No rashes, lesions or ulcers. HEENT: Atraumatic, normocephalic,  no obvious bleeding Lungs: Mild scattered wheezing bilaterally CVS: Regular rate and rhythm, no murmur GI/Abd soft, nontender, nondistended, bowel sound present CNS: Alert, awake, oriented x3 Psychiatry: Mood appropriate Extremities: No pedal edema, no calf tenderness  Data Review: I have personally reviewed the laboratory data and studies available.  F/u labs ordered Unresulted Labs (From admission, onward)     Start     Ordered   10/29/21 0500  Creatinine, serum  (enoxaparin (LOVENOX)    CrCl >/= 30 ml/min)  Weekly,   STAT     Comments: while on enoxaparin therapy    10/22/21 1929   10/24/21 0500  CBC with Differential/Platelet  Daily,   STAT      10/23/21 1308   10/24/21 8676  Basic metabolic panel  Daily,   STAT      10/23/21 1308   10/22/21 1929  MRSA Next Gen by PCR, Nasal  (Non-severe pneumonia (non-ICU care) in adult without resistant organism risk factors.)  Once,   STAT        10/22/21 1929            Signed, Terrilee Croak, MD [facility] 10/24/2021

## 2021-10-24 NOTE — Evaluation (Signed)
Physical Therapy Evaluation Patient Details Name: Sonya Gaines MRN: [id number] DOB: 08-05-1946 Today's Date: 10/24/2021  History of Present Illness  Sonya Gaines is a 75 y.o. female with history h/o COPD, asthma, GERD, hypertension, hyperlipidemia, obstructive sleep apnea (not on CPAP or home O2) who was recently admitted to this [facility] for RSV infection and multilobar pneumonia, subsequently discharged home on 09/18/2021-now presents with complaints of recurrent cough and extreme weakness.  Patient states since last discharge, she was doing okay until 10 days or so later developed congestion and productive cough with whitish phlegm.  Lives alone and denies any sick contacts.   Clinical Impression  Patient received in bed, talking on phone. She is agreeable to PT assessment. She reports she has been up to bathroom several times independently. Patient independent with bed mobility and transfers. Ambulated 200 feet with supervision, pushing her IV pole. Patient appears to be at baseline level of function. No further skilled PT needs at this time.          Recommendations for follow up therapy are one component of a multi-disciplinary discharge planning process, led by the attending physician.  Recommendations may be updated based on patient status, additional functional criteria and insurance authorization.  Follow Up Recommendations No PT follow up    Assistance Recommended at Discharge Intermittent Supervision/Assistance  Functional Status Assessment Patient has had a recent decline in their functional status and demonstrates the ability to make significant improvements in function in a reasonable and predictable amount of time.  Equipment Recommendations  None recommended by PT    Recommendations for Other Services       Precautions / Restrictions Precautions Precautions: None Restrictions Weight Bearing Restrictions: No      Mobility  Bed Mobility Overal bed mobility:  Independent                  Transfers Overall transfer level: Independent                      Ambulation/Gait Ambulation/Gait assistance: Independent   Assistive device: IV Pole Gait Pattern/deviations: WFL(Within Functional Limits) Gait velocity: WNL     General Gait Details: patient has been getting up to bathroom independently. ambulated 200 feet with supervision.  Stairs            Wheelchair Mobility    Modified Rankin (Stroke Patients Only)       Balance Overall balance assessment: Independent                                           Pertinent Vitals/Pain Pain Assessment: No/denies pain    Home Living Family/patient expects to be discharged to:: Private residence Living Arrangements: Alone Available Help at Discharge: Family;Available PRN/intermittently Type of Home: House Home Access: Stairs to enter   [employer] of Steps: 2-3   Home Layout: One level Home Equipment: None      Prior Function Prior Level of Function : Independent/Modified Independent             Mobility Comments: independent, takes care of her mother who is 69. ADLs Comments: independent     Hand Dominance        Extremity/Trunk Assessment   Upper Extremity Assessment Upper Extremity Assessment: Overall WFL for tasks assessed    Lower Extremity Assessment Lower Extremity Assessment: Overall WFL for tasks assessed  Cervical / Trunk Assessment Cervical / Trunk Assessment: Normal  Communication   Communication: No difficulties  Cognition Arousal/Alertness: Awake/alert Behavior During Therapy: WFL for tasks assessed/performed Overall Cognitive Status: Within Functional Limits for tasks assessed                                          General Comments      Exercises     Assessment/Plan    PT Assessment Patient does not need any further PT services  PT Problem List         PT  Treatment Interventions      PT Goals (Current goals can be found in the Care Plan section)  Acute Rehab PT Goals Patient Stated Goal: to go home PT Goal Formulation: With patient Time For Goal Achievement: 10/31/21 Potential to Achieve Goals: Good    Frequency     Barriers to discharge        Co-evaluation               AM-PAC PT "6 Clicks" Mobility  Outcome Measure Help needed turning from your back to your side while in a flat bed without using bedrails?: None Help needed moving from lying on your back to sitting on the side of a flat bed without using bedrails?: None Help needed moving to and from a bed to a chair (including a wheelchair)?: None Help needed standing up from a chair using your arms (e.g., wheelchair or bedside chair)?: None Help needed to walk in hospital room?: None Help needed climbing 3-5 steps with a railing? : None 6 Click Score: 24    End of Session   Activity Tolerance: Patient tolerated treatment well Patient left: in chair;with call bell/phone within reach Nurse Communication: Mobility status      Time: 1041-1056 PT Time Calculation (min) (ACUTE ONLY): 15 min   Charges:   PT Evaluation $PT Eval Low Complexity: 1 Low          Jaben Benegas, PT, GCS 10/24/21,11:03 AM

## 2021-10-25 LAB — CBC WITH DIFFERENTIAL/PLATELET
Abs Immature Granulocytes: 0.02 10*3/uL (ref 0.00–0.07)
Basophils Absolute: 0 10*3/uL (ref 0.0–0.1)
Basophils Relative: 1 %
Eosinophils Absolute: 0.1 10*3/uL (ref 0.0–0.5)
Eosinophils Relative: 2 %
HCT: 31.8 % — ABNORMAL LOW (ref 36.0–46.0)
Hemoglobin: 10.5 g/dL — ABNORMAL LOW (ref 12.0–15.0)
Immature Granulocytes: 0 %
Lymphocytes Relative: 30 %
Lymphs Abs: 1.9 10*3/uL (ref 0.7–4.0)
MCH: 28.5 pg (ref 26.0–34.0)
MCHC: 33 g/dL (ref 30.0–36.0)
MCV: 86.2 fL (ref 80.0–100.0)
Monocytes Absolute: 0.4 10*3/uL (ref 0.1–1.0)
Monocytes Relative: 6 %
Neutro Abs: 4.1 10*3/uL (ref 1.7–7.7)
Neutrophils Relative %: 61 %
Platelets: 222 10*3/uL (ref 150–400)
RBC: 3.69 MIL/uL — ABNORMAL LOW (ref 3.87–5.11)
RDW: 13.7 % (ref 11.5–15.5)
WBC: 6.5 10*3/uL (ref 4.0–10.5)
nRBC: 0 % (ref 0.0–0.2)

## 2021-10-25 LAB — BASIC METABOLIC PANEL
Anion gap: 8 (ref 5–15)
BUN: 10 mg/dL (ref 8–23)
CO2: 25 mmol/L (ref 22–32)
Calcium: 8.9 mg/dL (ref 8.9–10.3)
Chloride: 107 mmol/L (ref 98–111)
Creatinine, Ser: 0.54 mg/dL (ref 0.44–1.00)
GFR, Estimated: >60 mL/min (ref >60)
Glucose, Bld: 84 mg/dL (ref 70–99)
Potassium: 3.6 mmol/L (ref 3.5–5.1)
Sodium: 140 mmol/L (ref 135–145)

## 2021-10-25 MED ORDER — CEFDINIR 300 MG PO CAPS
300.0000 mg | ORAL_CAPSULE | Freq: Two times a day (BID) | ORAL | Status: DC
  Administered 2021-10-25: 15:00:00 300 mg via ORAL
  Filled 2021-10-25 (×2): qty 1

## 2021-10-25 MED ORDER — SACCHAROMYCES BOULARDII 250 MG PO CAPS: 250.0000 mg | ORAL_CAPSULE | Freq: Two times a day (BID) | ORAL | 0 refills | Status: AC

## 2021-10-25 MED ORDER — GUAIFENESIN-DM 100-10 MG/5ML PO SYRP
5.0000 mL | ORAL_SOLUTION | Freq: Four times a day (QID) | ORAL | 0 refills | Status: DC | PRN
Start: 2021-10-25 — End: 2024-01-08

## 2021-10-25 MED ORDER — GUAIFENESIN-DM 100-10 MG/5ML PO SYRP
5.0000 mL | ORAL_SOLUTION | Freq: Four times a day (QID) | ORAL | Status: DC | PRN
  Filled 2021-10-25: qty 5

## 2021-10-25 MED ORDER — GUAIFENESIN ER 600 MG PO TB12: 600.0000 mg | ORAL_TABLET | Freq: Two times a day (BID) | ORAL | 0 refills | Status: AC

## 2021-10-25 MED ORDER — CEFDINIR 300 MG PO CAPS: 300.0000 mg | ORAL_CAPSULE | Freq: Two times a day (BID) | ORAL | 0 refills | Status: AC

## 2021-10-25 MED ORDER — GUAIFENESIN ER 600 MG PO TB12
600.0000 mg | ORAL_TABLET | Freq: Two times a day (BID) | ORAL | Status: DC
  Administered 2021-10-25: 600 mg via ORAL
  Filled 2021-10-25: qty 1

## 2021-10-25 NOTE — Discharge Summary (Signed)
Physician Discharge Summary  Sonya Gaines ZGY:[id number] DOB: 04/11/1946 DOA: 10/22/2021  PCP: Tracie Harrier, MD  Admit date: 10/22/2021 Discharge date: 10/25/2021  Admitted From: Home Discharge disposition: Home   Code Status: Full Code   Discharge Diagnosis:   Principal Problem:   Pneumonia Active Problems:   Essential hypertension   GERD (gastroesophageal reflux disease)   Cough variant asthma   Essential (primary) hypertension    Chief Complaint  Patient presents with   Shortness of Breath   Brief narrative: Sonya Gaines is a 75 y.o. female with PMH significant for HTN, HLD, OSA, COPD, GERD, osteoporosis who was recently hospitalized for RSV infection multilobar pneumonia, discharged home on 11/1, lives alone. Patient presented to the ED on 12/5 with complaint of recurrent cough, worsening fatigue ongoing for last 3 weeks.  No fever.  For the last 3 days, patient was feeling extremely weak and was sleeping most of the time.  In the ED, patient had a fever of 102.6, tachycardic to 126. Labs with WC count 14.4, hemoglobin 13.3, platelet 303, BMP unremarkable Troponin negative, lactic acid elevated to 3.8, procalcitonin 0.15 Respiratory virus panel negative. CT chest showed resolving bibasilar infiltrates and new left upper lobe infiltrate. Admitted to hospitalist service for further evaluation management  Subjective: Patient was seen and examined this morning.  Lying down in bed.  Not in distress.  Feels better.  Earlier she had a bad coughing spell and started wheezing after that.  It improved after few deep breaths. Feels ready to go home.  Assessment/Plan: Sepsis secondary to pneumonia -Presented with 3 weeks of progressive fatigue, cough, significantly worse for 3 days prior to presentation -CT chest showed resolving bibasilar infiltrates and new left upper lobe infiltrate. -She was started on IV Rocephin and doxycycline with clinical improvement.  Blood  culture did not show any growth.  No recurrence of fever.  WC count and lactic acid level normalized.  Not requiring supplemental oxygen.  Able to ambulate in the hallway without oxygen. -We will discharge her on 5 more days of oral antibiotics, not any steroids currently. -She wants to see pulmonologist as an outpatient.  I sent a message to Dr. Lanney Gins.  Recent Labs  Lab 10/22/21 0936 10/22/21 1400 10/23/21 0527 10/24/21 0408 10/25/21 0407  WBC 14.4*  --   --  8.0 6.5  LATICACIDVEN  --  3.8* 0.8  --   --   PROCALCITON  --  0.15  --   --   --    COPD -Home meds include Symbicort, albuterol, as needed DuoNeb -Patient has a nebulizer machine at home.  She has been using nebulizer at home.  In the hospital, she finds it easier with the mask and wants to continue with the mask at home.  Sleep apnea -Does not use CPAP at home encouraged compliance.  Essential hypertension -Home meds include losartan 50 mg daily, amlodipine 2.5 mg daily -Continue the same.  Hyperlipidemia -Zetia  Depression/anxiety -Citalopram  GERD -Protonix, iron supplement Recent Labs    09/17/21 0534 09/18/21 0846 10/22/21 0936 10/24/21 0408 10/25/21 0407  HGB 11.3* 11.3* 13.9 10.1* 10.5*  MCV 86.3 86.9 89.1 86.6 86.2   Mobility: Encourage ambulation Living condition: Lives alone Goals of care:   Code Status: Full Code  Nutritional status: Body mass index is 25.13 kg/m.       Discharge Medications:   Allergies as of 10/25/2021       Reactions   Apraclonidine Rash   Lifitegrast  Rash, Swelling   Eye swelling   Cheese Other (See Comments)   Sinus congestion   Chocolate Flavor Other (See Comments)   Sinus congestion   Lac Bovis Other (See Comments)   Sinus congestion   Brimonidine Rash   Clarithromycin Rash   Clindamycin/lincomycin Swelling, Rash   Levofloxacin Rash   Nickel Rash   Penicillins Rash   Polytrim [polymyxin B-trimethoprim] Rash   Statins Rash   Sulfamethoxazole Rash    Sulfonamide Derivatives Rash   Tobramycin Rash        Medication List     STOP taking these medications    hydrALAZINE 10 MG tablet Commonly known as: APRESOLINE   predniSONE 5 MG tablet Commonly known as: DELTASONE   promethazine-dextromethorphan 6.25-15 MG/5ML syrup Commonly known as: PROMETHAZINE-DM       TAKE these medications    albuterol 108 (90 Base) MCG/ACT inhaler Commonly known as: VENTOLIN HFA Inhale 2 puffs into the lungs every 6 (six) hours as needed for wheezing or shortness of breath.   amLODipine 2.5 MG tablet Commonly known as: NORVASC Take 2.5 mg by mouth daily.   budesonide-formoterol 160-4.5 MCG/ACT inhaler Commonly known as: SYMBICORT Inhale 2 puffs into the lungs 2 (two) times daily.   calcium carbonate 1500 (600 Ca) MG Tabs tablet Commonly known as: OSCAL Take 1,500 mg by mouth daily.   cefdinir 300 MG capsule Commonly known as: OMNICEF Take 1 capsule (300 mg total) by mouth every 12 (twelve) hours for 5 days.   citalopram 10 MG tablet Commonly known as: CELEXA Take 10 mg by mouth daily.   dorzolamide-timolol 22.3-6.8 MG/ML ophthalmic solution Commonly known as: COSOPT Place 1 drop into the right eye 2 (two) times daily.   esomeprazole 40 MG capsule Commonly known as: NEXIUM Take 40 mg by mouth daily.   ezetimibe 10 MG tablet Commonly known as: ZETIA Take 10 mg by mouth daily.   Ferrex 150 150 MG capsule Generic drug: iron polysaccharides Take 150 mg by mouth every other day.   guaiFENesin 600 MG 12 hr tablet Commonly known as: MUCINEX Take 1 tablet (600 mg total) by mouth 2 (two) times daily for 14 days.   guaiFENesin-dextromethorphan 100-10 MG/5ML syrup Commonly known as: ROBITUSSIN DM Take 5 mLs by mouth every 6 (six) hours as needed for cough.   ipratropium-albuterol 0.5-2.5 (3) MG/3ML Soln Commonly known as: DUONEB Inhale 3 mLs into the lungs every 4 (four) hours as needed.   latanoprost 0.005 % ophthalmic  solution Commonly known as: XALATAN Place 1 drop into the right eye at bedtime.   losartan 50 MG tablet Commonly known as: COZAAR Take 50 mg by mouth daily.   meloxicam 7.5 MG tablet Commonly known as: MOBIC Take 7.5 mg by mouth daily as needed.   montelukast 10 MG tablet Commonly known as: SINGULAIR Take 10 mg by mouth at bedtime.   PreserVision AREDS Tabs Take 1 tablet by mouth 2 (two) times daily.   Rhopressa 0.02 % Soln Generic drug: Netarsudil Dimesylate Place 1 drop into the right eye at bedtime.   saccharomyces boulardii 250 MG capsule Commonly known as: FLORASTOR Take 1 capsule (250 mg total) by mouth 2 (two) times daily for 5 days.   vitamin B-12 1000 MCG tablet Commonly known as: CYANOCOBALAMIN Take 1,000 mcg by mouth daily.        Wound care:   Incision (Closed) 03/12/17 Hip Right (Active)  Date First Assessed/Time First Assessed: 03/12/17 1056   Location: Hip  Location Orientation:  Right    Assessments 03/12/2017 12:26 PM 03/14/2017  9:22 AM  Dressing Type Honeycomb Honeycomb  Dressing -- Clean;Dry;Intact  Site / Wound Assessment -- Dressing in place / Unable to assess  Drainage Amount -- None     No Linked orders to display    Discharge Instructions:   Discharge Instructions     Call MD for:  difficulty breathing, headache or visual disturbances   Complete by: As directed    Call MD for:  extreme fatigue   Complete by: As directed    Call MD for:  hives   Complete by: As directed    Call MD for:  persistant dizziness or light-headedness   Complete by: As directed    Call MD for:  persistant nausea and vomiting   Complete by: As directed    Call MD for:  severe uncontrolled pain   Complete by: As directed    Call MD for:  temperature >100.4   Complete by: As directed    Diet general   Complete by: As directed    Discharge instructions   Complete by: As directed    General discharge instructions:  Follow with Primary MD Tracie Harrier, MD in 7 days   Get CBC/BMP checked in next visit within 1 week by PCP or SNF MD. (We routinely change or add medications that can affect your baseline labs and fluid status, therefore we recommend that you get the mentioned basic workup next visit with your PCP, your PCP may decide not to get them or add new tests based on their clinical decision)  On your next visit with your PCP, please get your medicines reviewed and adjusted.  Please request your PCP  to go over all hospital tests, procedures, radiology results at the follow up, please get all Hospital records sent to your PCP by signing hospital release before you go home.  Activity: As tolerated with Full fall precautions use walker/cane & assistance as needed  Avoid using any recreational substances like cigarette, tobacco, alcohol, or non-prescribed drug.  If you experience worsening of your admission symptoms, develop shortness of breath, life threatening emergency, suicidal or homicidal thoughts you must seek medical attention immediately by calling 911 or calling your MD immediately  if symptoms less severe.  You must read complete instructions/literature along with all the possible adverse reactions/side effects for all the medicines you take and that have been prescribed to you. Take any new medicine only after you have completely understood and accepted all the possible adverse reactions/side effects.   Do not drive, operate heavy machinery, perform activities at heights, swimming or participation in water activities or provide baby sitting services if your were admitted for syncope or siezures until you have seen by Primary MD or a Neurologist and advised to do so again.  Do not drive when taking Pain medications.  Do not take more than prescribed Pain, Sleep and Anxiety Medications  Wear Seat belts while driving.  Please note You were cared for by a hospitalist during your hospital stay. If you have any questions  about your discharge medications or the care you received while you were in the hospital after you are discharged, you can call the unit and asked to speak with the hospitalist on call if the hospitalist that took care of you is not available. Once you are discharged, your primary care physician will handle any further medical issues. Please note that NO REFILLS for any discharge medications will be authorized once  you are discharged, as it is imperative that you return to your primary care physician (or establish a relationship with a primary care physician if you do not have one) for your aftercare needs so that they can reassess your need for medications and monitor your lab values.   Increase activity slowly   Complete by: As directed        Follow ups:    Follow-up Information     Tracie Harrier, MD Follow up.   Specialty: Internal Medicine Contact information: [place] [address] [phone]                 Discharge Exam:   Vitals:   10/24/21 2344 10/25/21 0548 10/25/21 0757 10/25/21 1154  BP: (!) 150/76 (!) 166/79 (!) 146/72 (!) 157/62  Pulse: 80 83 69 79  Resp: 16 18 18 18   Temp: 98.3 F (36.8 C) 97.9 F (36.6 C) 97.8 F (36.6 C) 97.7 F (36.5 C)  TempSrc: Oral Oral Oral Oral  SpO2: 97% 98% 98% 97%  Weight:      Height:        Body mass index is 25.13 kg/m.   General exam: Pleasant, elderly Caucasian female.  Not in physical distress Skin: No rashes, lesions or ulcers. HEENT: Atraumatic, normocephalic, no obvious bleeding Lungs: Clear to auscultation bilaterally today CVS: Regular rate and rhythm, no murmur GI/Abd soft, nontender, nondistended, bowel sound present CNS: Alert, awake, oriented x3 Psychiatry: Mood appropriate Extremities: No pedal edema, no calf tenderness  Time coordinating discharge: 35 minutes   The results of significant diagnostics from this hospitalization (including imaging, microbiology, ancillary  and laboratory) are listed below for reference.    Procedures and Diagnostic Studies:   DG Chest 2 View  Result Date: 10/22/2021 CLINICAL DATA:  Shortness of breath, pneumonia EXAM: CHEST - 2 VIEW COMPARISON:  09/15/2021.  CT 09/16/2021 FINDINGS: Heart and mediastinal contours are within normal limits. No focal opacities or effusions. No acute bony abnormality. IMPRESSION: No active cardiopulmonary disease. Electronically Signed   By: Rolm Baptise M.D.   On: 10/22/2021 10:21   CT Chest Wo Contrast  Result Date: 10/22/2021 CLINICAL DATA:  Pneumonia. EXAM: CT CHEST WITHOUT CONTRAST TECHNIQUE: Multidetector CT imaging of the chest was performed following the standard protocol without IV contrast. COMPARISON:  September 16, 2021. FINDINGS: Cardiovascular: Atherosclerosis of thoracic aorta is noted without aneurysm formation. Normal cardiac size. No pericardial effusion. Mild coronary artery calcifications are noted. Mediastinum/Nodes: No enlarged mediastinal or axillary lymph nodes. Thyroid gland, trachea, and esophagus demonstrate no significant findings. Lungs/Pleura: No pneumothorax or pleural effusion is noted. There appears to be improvement involving the nodular opacity seen posteriorly in both lung bases suggesting improving atypical inflammation in this area. However, there appears to be significantly increased nodular opacities involving the left upper lobe concerning for worsening atypical infection in this area. Upper Abdomen: No acute abnormality. Musculoskeletal: No chest wall mass or suspicious bone lesions identified. IMPRESSION: There does appear to be improvement involving the bilateral posterior basilar opacities noted on prior exam, suggesting improving atypical infection in this area. However, there appears to be significantly increased nodular opacities in the left upper lobe since prior exam consistent with worsening atypical infection in this area. Aortic Atherosclerosis (ICD10-I70.0).  Electronically Signed   By: Marijo Conception M.D.   On: 10/22/2021 15:47     Labs:   Basic Metabolic Panel: Recent Labs  Lab 10/22/21 0936 10/24/21 0408 10/25/21 0407  NA 135 137  140  K 4.2 3.5 3.6  CL 101 108 107  CO2 28 23 25   GLUCOSE 98 97 84  BUN 18 9 10   CREATININE 0.73 0.56 0.54  CALCIUM 9.6 8.5* 8.9   GFR Estimated Creatinine Clearance: 50.6 mL/min (by C-G formula based on SCr of 0.54 mg/dL). Liver Function Tests: Recent Labs  Lab 10/22/21 0936  AST 21  ALT 20  ALKPHOS 82  BILITOT 0.5  PROT 7.6  ALBUMIN 4.1   No results for input(s): LIPASE, AMYLASE in the last 168 hours. No results for input(s): AMMONIA in the last 168 hours. Coagulation profile No results for input(s): INR, PROTIME in the last 168 hours.  CBC: Recent Labs  Lab 10/22/21 0936 10/24/21 0408 10/25/21 0407  WBC 14.4* 8.0 6.5  NEUTROABS  --  5.6 4.1  HGB 13.9 10.1* 10.5*  HCT 43.2 30.5* 31.8*  MCV 89.1 86.6 86.2  PLT 303 195 222   Cardiac Enzymes: No results for input(s): CKTOTAL, CKMB, CKMBINDEX, TROPONINI in the last 168 hours. BNP: Invalid input(s): POCBNP CBG: No results for input(s): GLUCAP in the last 168 hours. D-Dimer No results for input(s): DDIMER in the last 72 hours. Hgb A1c No results for input(s): HGBA1C in the last 72 hours. Lipid Profile No results for input(s): CHOL, HDL, LDLCALC, TRIG, CHOLHDL, LDLDIRECT in the last 72 hours. Thyroid function studies No results for input(s): TSH, T4TOTAL, T3FREE, THYROIDAB in the last 72 hours.  Invalid input(s): FREET3 Anemia work up No results for input(s): VITAMINB12, FOLATE, FERRITIN, TIBC, IRON, RETICCTPCT in the last 72 hours. Microbiology Recent Results (from the past 240 hour(s))  Resp Panel by RT-PCR (Flu A&B, Covid) Nasopharyngeal Swab     Status: None   Collection Time: 10/22/21  1:41 PM   Specimen: Nasopharyngeal Swab; Nasopharyngeal(NP) swabs in vial transport medium  Result Value Ref Range Status   SARS  Coronavirus 2 by RT PCR NEGATIVE NEGATIVE Final    Comment: (NOTE) SARS-CoV-2 target nucleic acids are NOT DETECTED.  The SARS-CoV-2 RNA is generally detectable in upper respiratory specimens during the acute phase of infection. The lowest concentration of SARS-CoV-2 viral copies this assay can detect is 138 copies/mL. A negative result does not preclude SARS-Cov-2 infection and should not be used as the sole basis for treatment or other patient management decisions. A negative result may occur with  improper specimen collection/handling, submission of specimen other than nasopharyngeal swab, presence of viral mutation(s) within the areas targeted by this assay, and inadequate number of viral copies(<138 copies/mL). A negative result must be combined with clinical observations, patient history, and epidemiological information. The expected result is Negative.  Fact Sheet for Patients:  EntrepreneurPulse.com.au  Fact Sheet for Healthcare Providers:  IncredibleEmployment.be  This test is no t yet approved or cleared by the [place] FDA and  has been authorized for detection and/or diagnosis of SARS-CoV-2 by FDA under an Emergency Use Authorization (EUA). This EUA will remain  in effect (meaning this test can be used) for the duration of the COVID-19 declaration under Section 564(b)(1) of the Act, 21 U.S.C.section 360bbb-3(b)(1), unless the authorization is terminated  or revoked sooner.       Influenza A by PCR NEGATIVE NEGATIVE Final   Influenza B by PCR NEGATIVE NEGATIVE Final    Comment: (NOTE) The Xpert Xpress SARS-CoV-2/FLU/RSV plus assay is intended as an aid in the diagnosis of influenza from Nasopharyngeal swab specimens and should not be used as a sole basis for treatment. Nasal washings and  aspirates are unacceptable for Xpert Xpress SARS-CoV-2/FLU/RSV testing.  Fact Sheet for  Patients: EntrepreneurPulse.com.au  Fact Sheet for Healthcare Providers: IncredibleEmployment.be  This test is not yet approved or cleared by the [place] FDA and has been authorized for detection and/or diagnosis of SARS-CoV-2 by FDA under an Emergency Use Authorization (EUA). This EUA will remain in effect (meaning this test can be used) for the duration of the COVID-19 declaration under Section 564(b)(1) of the Act, 21 U.S.C. section 360bbb-3(b)(1), unless the authorization is terminated or revoked.  Performed at [facility], [address] [place], [place] [address]   Blood culture (routine x 2)     Status: None (Preliminary result)   Collection Time: 10/22/21  2:00 PM   Specimen: BLOOD  Result Value Ref Range Status   Specimen Description BLOOD RIGHT ANTECUBITAL  Final   Special Requests   Final    BOTTLES DRAWN AEROBIC AND ANAEROBIC Blood Culture adequate volume   Culture   Final    NO GROWTH 3 DAYS Performed at [facility], [address], [place], [place] [address]    Report Status PENDING  Incomplete  Blood culture (routine x 2)     Status: None (Preliminary result)   Collection Time: 10/22/21  2:00 PM   Specimen: BLOOD  Result Value Ref Range Status   Specimen Description BLOOD LEFT ANTECUBITAL  Final   Special Requests   Final    BOTTLES DRAWN AEROBIC AND ANAEROBIC Blood Culture results may not be optimal due to an excessive volume of blood received in culture bottles   Culture   Final    NO GROWTH 3 DAYS Performed at [facility], [address] [place], [place] [address]    Report Status PENDING  Incomplete  Urine Culture     Status: None   Collection Time: 10/22/21  3:00 PM   Specimen: Urine, Clean Catch  Result Value Ref Range Status   Specimen Description   Final    URINE, CLEAN CATCH Performed at [facility], [address], [place], [place] [address]    Special  Requests   Final    NONE Performed at [facility], [address], [place], [place] [address]    Culture   Final    NO GROWTH Performed at [facility], [place] [address], [place],  [address]    Report Status 10/24/2021 FINAL  Final     Signed: Marlowe Aschoff Mikele Sifuentes  [facility] 10/25/2021, 12:41 PM

## 2021-10-25 NOTE — Progress Notes (Signed)
Pt d/c instructions reviewed. No questions at time of d/c. Pt request to ambulate out, declined wheel chair.

## 2021-10-27 LAB — CULTURE, BLOOD (ROUTINE X 2)
Culture: NO GROWTH
Culture: NO GROWTH
Special Requests: ADEQUATE

## 2021-12-25 IMAGING — CT CT ABD-PELV W/ CM
2 of 5 series · 16 of 46 positions shown, 18 images · IV contrast (APPLIED)
Comparison: CT abdomen pelvis 08/14/2013.

CLINICAL DATA: Upper abdominal pain and constipation for 2 days.

EXAM:
CT ABDOMEN AND PELVIS WITH CONTRAST
TECHNIQUE: Multidetector CT imaging of the abdomen and pelvis was performed
using the standard protocol following bolus administration of
intravenous contrast.
CONTRAST:  75 mL OMNIPAQUE IOHEXOL 300 MG/ML  SOLN

[Series 2: axial (person_name) (person_name) · axial · 0.72mm/px · z∈[-911,-571]mm · 13 of 78 slices shown, 15 images]
[im 5/78  soft-tissue]
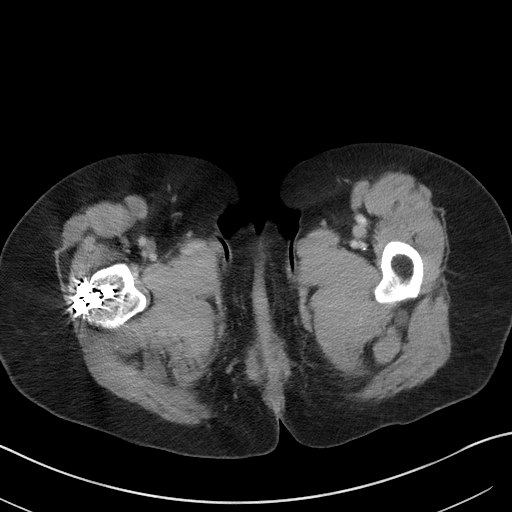
[im 5/78  bone]
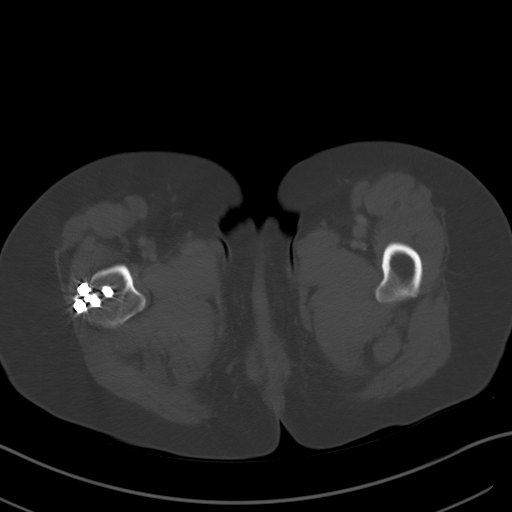
[im 10/78  soft-tissue]
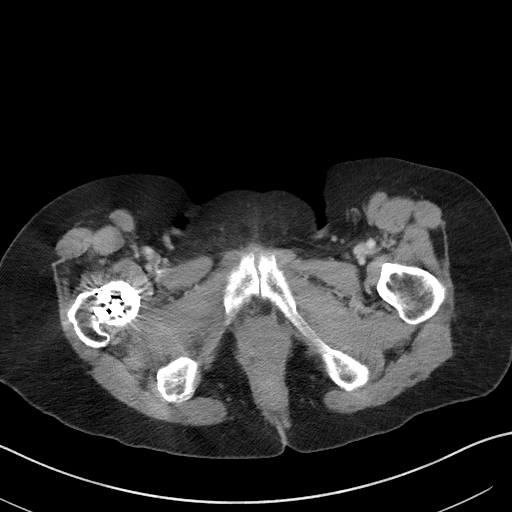
[im 19/78  soft-tissue]
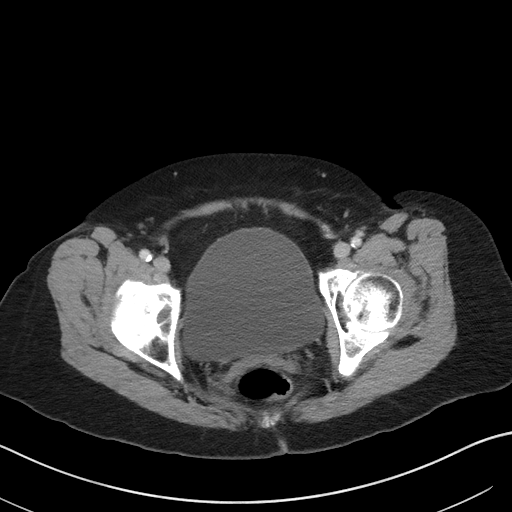
[im 23/78  soft-tissue]
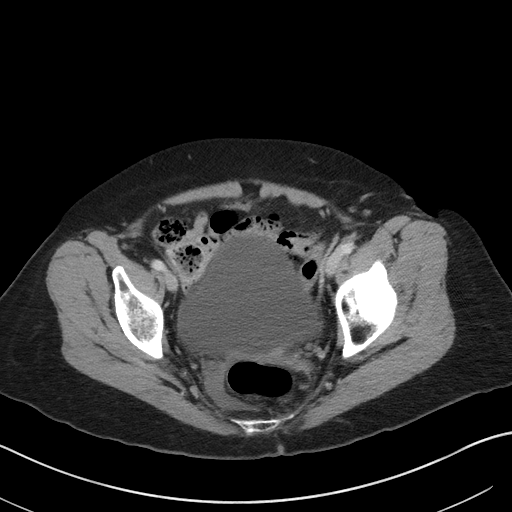
[im 28/78  soft-tissue]
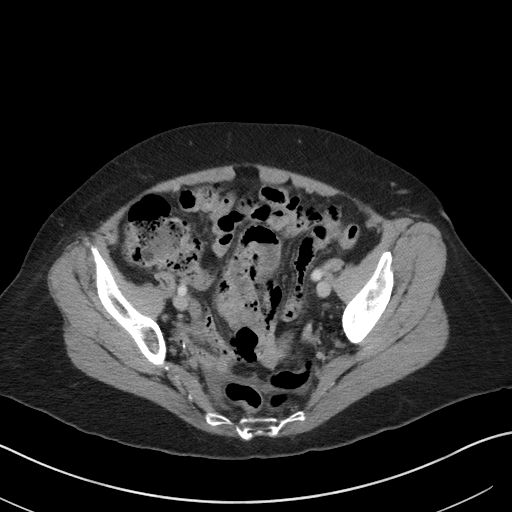
[im 32/78  soft-tissue]
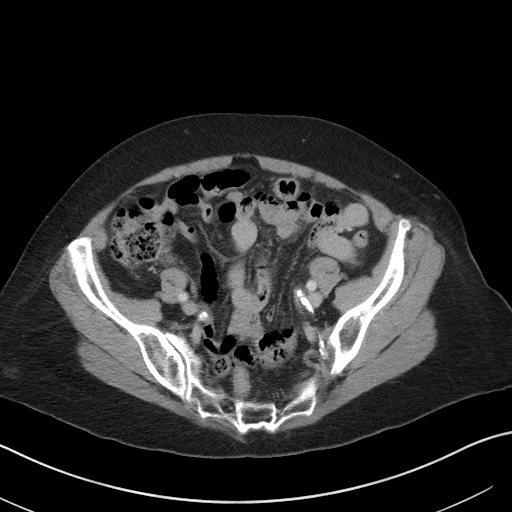
[im 41/78  soft-tissue]
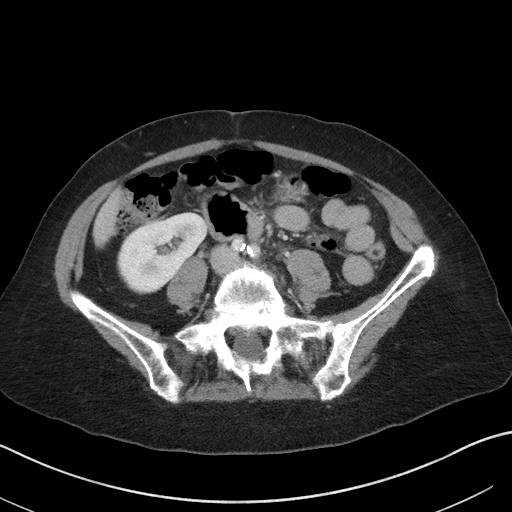
[im 46/78  soft-tissue]
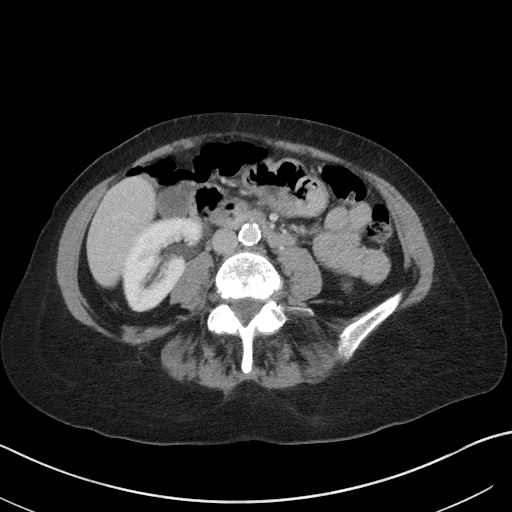
[im 50/78  soft-tissue]
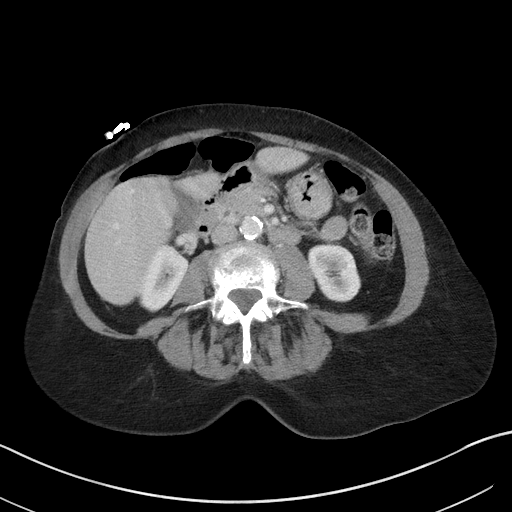
[im 50/78  bone]
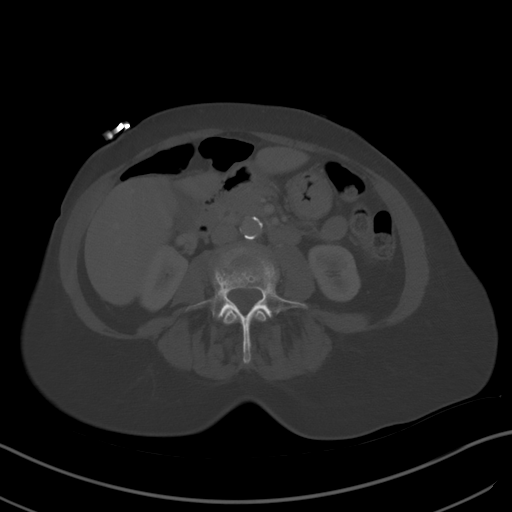
[im 55/78  soft-tissue]
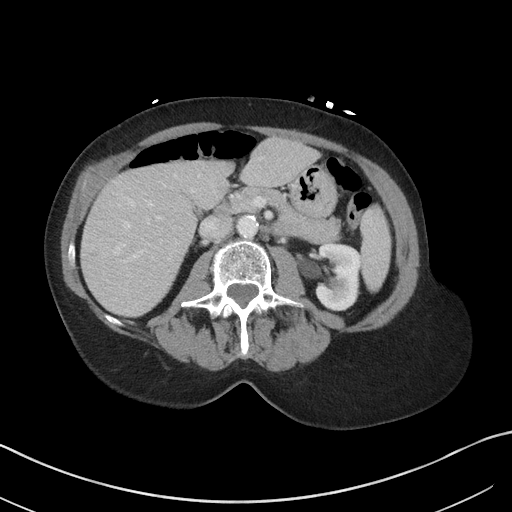
[im 59/78  soft-tissue]
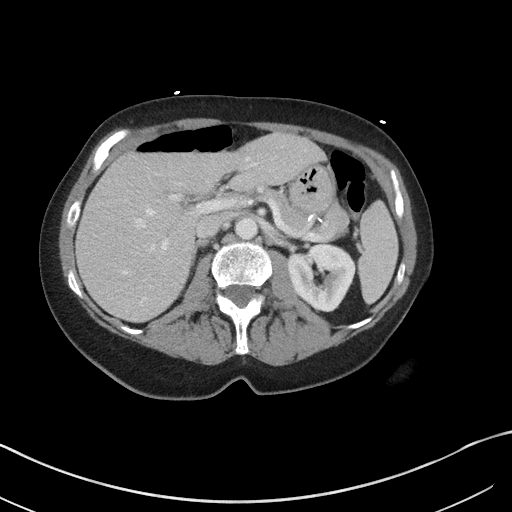
[im 68/78  soft-tissue]
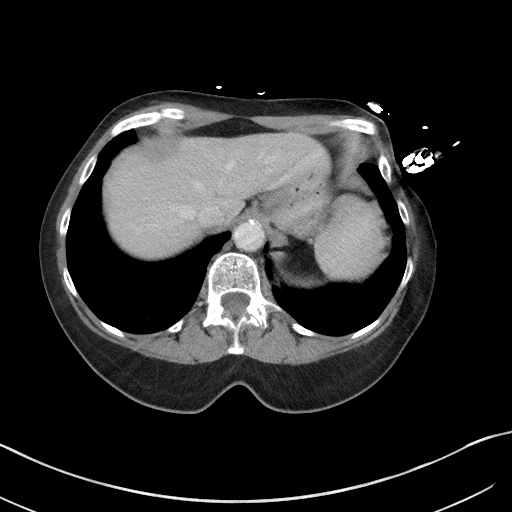
[im 73/78  soft-tissue]
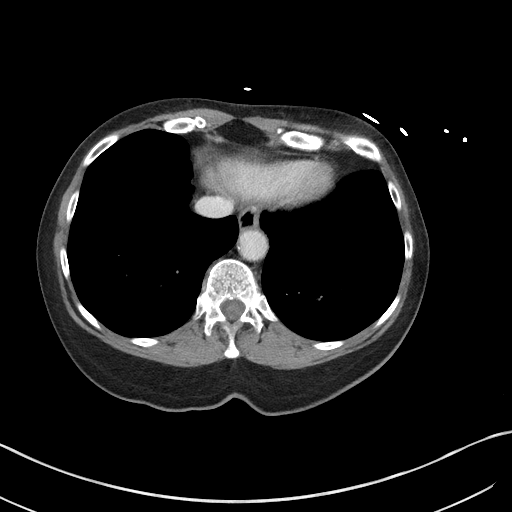

[Series 5: coronal st · coronal · 0.69mm/px · 3 of 86 slices shown]
[im 29/86  soft-tissue]
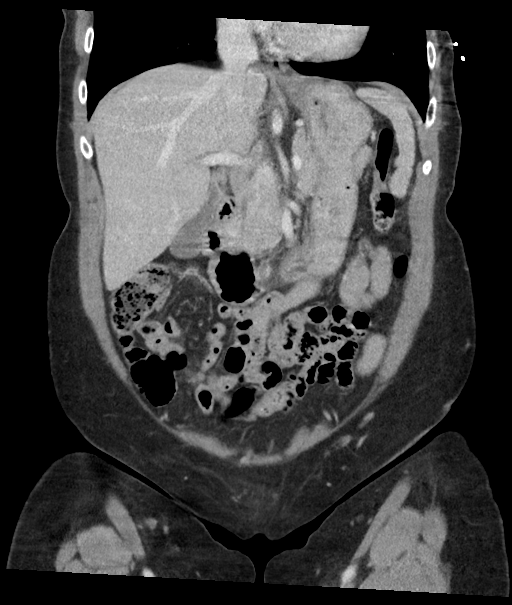
[im 38/86  soft-tissue]
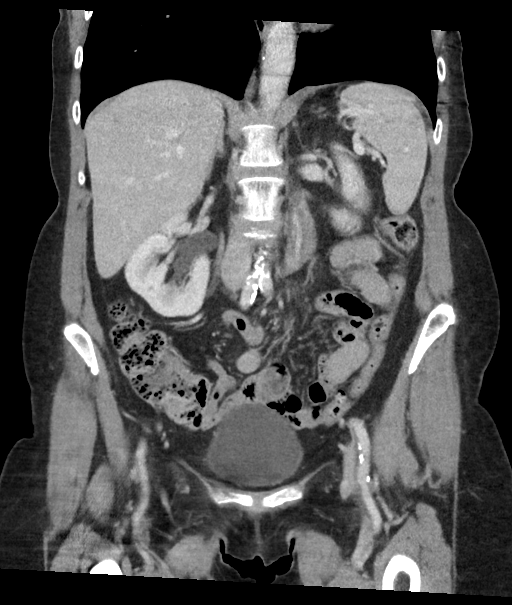
[im 48/86  soft-tissue]
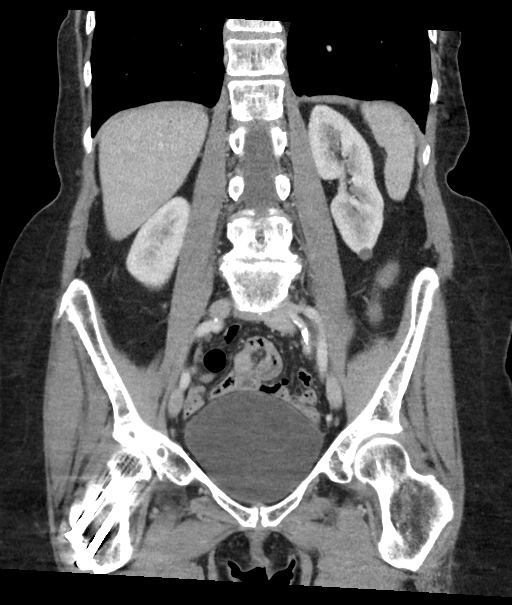

[16 of 46 positions shown; findings below may reference images not displayed]

FINDINGS: Lower chest: Minimal atelectasis or scar in the lung bases. No
pleural or pericardial effusion.

Hepatobiliary: 2 small cysts are seen in the liver. The liver is
otherwise unremarkable. Gallbladder and biliary tree appear normal.

Pancreas: Unremarkable. No pancreatic ductal dilatation or
surrounding inflammatory changes.

Spleen: Normal in size without focal abnormality.

Adrenals/Urinary Tract: Adrenal glands are unremarkable. Kidneys are
normal, without renal calculi, focal lesion, or hydronephrosis.
Bladder is unremarkable.

Stomach/Bowel: Stomach is within normal limits. The appendix is not
visualized but no evidence of appendicitis is seen. No evidence of
bowel wall thickening, distention, or inflammatory changes.

Vascular/Lymphatic: Aortic atherosclerosis. No enlarged abdominal or
pelvic lymph nodes.

Reproductive: Uterus and bilateral adnexa are unremarkable.

Other: There is a small volume of free pelvic fluid. Small fat
containing umbilical hernia is noted.

Musculoskeletal: No acute or focal abnormality. The patient is
status post fixation of a healed right hip fracture.
IMPRESSION: Small volume of free pelvic fluid. Cause for the fluid is not
identified but it could be due to enteritis.

Small fat containing umbilical hernia.

Aortic Atherosclerosis ([id number]).

## 2023-06-09 ENCOUNTER — Other Ambulatory Visit: Payer: Self-pay | Admitting: Internal Medicine

## 2023-06-09 DIAGNOSIS — Z1231 Encounter for screening mammogram for malignant neoplasm of breast: Secondary | ICD-10-CM

## 2023-06-13 ENCOUNTER — Ambulatory Visit
Admission: RE | Admit: 2023-06-13 | Discharge: 2023-06-13 | Disposition: A | Discharge status: Home / Self Care | Payer: Medicare PPO | Source: Ambulatory Visit | Attending: Internal Medicine | Admitting: Internal Medicine

## 2023-06-13 DIAGNOSIS — Z1231 Encounter for screening mammogram for malignant neoplasm of breast: Secondary | ICD-10-CM | POA: Diagnosis present

## 2023-12-22 ENCOUNTER — Encounter: Payer: Self-pay | Admitting: Internal Medicine

## 2023-12-23 ENCOUNTER — Encounter
Admission: RE | Disposition: A | Discharge status: Home / Self Care | Payer: Self-pay | Source: Home / Self Care | Attending: Internal Medicine

## 2023-12-23 ENCOUNTER — Encounter: Payer: Self-pay | Admitting: Internal Medicine

## 2023-12-23 ENCOUNTER — Ambulatory Visit
Admission: RE | Admit: 2023-12-23 | Discharge: 2023-12-23 | Disposition: A | Discharge status: Home / Self Care | Payer: Medicare PPO | Attending: Internal Medicine | Admitting: Internal Medicine

## 2023-12-23 ENCOUNTER — Ambulatory Visit: Payer: Medicare PPO | Admitting: Certified Registered Nurse Anesthetist

## 2023-12-23 ENCOUNTER — Other Ambulatory Visit: Payer: Self-pay

## 2023-12-23 DIAGNOSIS — K317 Polyp of stomach and duodenum: Secondary | ICD-10-CM | POA: Insufficient documentation

## 2023-12-23 DIAGNOSIS — Z87891 Personal history of nicotine dependence: Secondary | ICD-10-CM | POA: Insufficient documentation

## 2023-12-23 DIAGNOSIS — J449 Chronic obstructive pulmonary disease, unspecified: Secondary | ICD-10-CM | POA: Insufficient documentation

## 2023-12-23 DIAGNOSIS — G4733 Obstructive sleep apnea (adult) (pediatric): Secondary | ICD-10-CM | POA: Insufficient documentation

## 2023-12-23 DIAGNOSIS — H3530 Unspecified macular degeneration: Secondary | ICD-10-CM | POA: Insufficient documentation

## 2023-12-23 DIAGNOSIS — K219 Gastro-esophageal reflux disease without esophagitis: Secondary | ICD-10-CM | POA: Insufficient documentation

## 2023-12-23 DIAGNOSIS — K2970 Gastritis, unspecified, without bleeding: Secondary | ICD-10-CM | POA: Diagnosis not present

## 2023-12-23 DIAGNOSIS — K920 Hematemesis: Secondary | ICD-10-CM | POA: Insufficient documentation

## 2023-12-23 DIAGNOSIS — I739 Peripheral vascular disease, unspecified: Secondary | ICD-10-CM | POA: Diagnosis not present

## 2023-12-23 DIAGNOSIS — Z79899 Other long term (current) drug therapy: Secondary | ICD-10-CM | POA: Diagnosis not present

## 2023-12-23 DIAGNOSIS — I10 Essential (primary) hypertension: Secondary | ICD-10-CM | POA: Insufficient documentation

## 2023-12-23 DIAGNOSIS — E785 Hyperlipidemia, unspecified: Secondary | ICD-10-CM | POA: Insufficient documentation

## 2023-12-23 DIAGNOSIS — H409 Unspecified glaucoma: Secondary | ICD-10-CM | POA: Insufficient documentation

## 2023-12-23 DIAGNOSIS — Z7951 Long term (current) use of inhaled steroids: Secondary | ICD-10-CM | POA: Diagnosis not present

## 2023-12-23 HISTORY — PX: BIOPSY: SHX5522

## 2023-12-23 HISTORY — PX: ESOPHAGOGASTRODUODENOSCOPY (EGD) WITH PROPOFOL: SHX5813

## 2023-12-23 SURGERY — ESOPHAGOGASTRODUODENOSCOPY (EGD) WITH PROPOFOL
Anesthesia: General

## 2023-12-23 MED ORDER — PROPOFOL 10 MG/ML IV BOLUS
INTRAVENOUS | Status: DC | PRN
  Administered 2023-12-23: 50 mg via INTRAVENOUS

## 2023-12-23 MED ORDER — PROPOFOL 500 MG/50ML IV EMUL
INTRAVENOUS | Status: DC | PRN
  Administered 2023-12-23: 130 ug/kg/min via INTRAVENOUS

## 2023-12-23 MED ORDER — SODIUM CHLORIDE 0.9 % IV SOLN: INTRAVENOUS | Status: DC

## 2023-12-23 MED ORDER — LIDOCAINE HCL (CARDIAC) PF 100 MG/5ML IV SOSY
PREFILLED_SYRINGE | INTRAVENOUS | Status: DC | PRN
  Administered 2023-12-23: 80 mg via INTRAVENOUS

## 2023-12-23 NOTE — H&P (Signed)
 Outpatient short stay form Pre-procedure 12/23/2023 9:02 AM Joevon Holliman K. Aundria, M.D.  Primary Physician: Tamra Leventhal, M.D.  Reason for visit:  Nausea, hemetemesis (coffee ground emesis).  History of present illness: Ms. Sanpedro is a 78 yo female with history of HTN, HLD, COPD, PVD, macular degeneration, glaucoma who is sent by her PCP for nausea/vomiting with hematemesis and melena. Stable Hgb 12-13.   Patient reports onset 3 months ago she had acute episode of nausea associated with chills, and vomited black emesis.No fresh blood. She had no heartburn, indigestion, or abdominal pain. She has been taking Nexium 40 mg daily for years. Stools have been black for one year. She has been taking oral iron . No change in bowel habits. Colonoscopy was 4 years ago. No hx of HP or PUD. Last EGD was 10 years ago- unremarkable. No HP. She denies NSAID use or Pepto-Bismol use. She has been under great stress. Lab studies with transient leukocytosis and stable hemoglobin 13.7-12.9 . She was advised to increase her Nexium to twice daily and begin Carafate.   Today, she reports her N/V resolved and she felt much better on the Carafate and Nexium twice daily. She has seen no further black stools.   Denies NSAIDs, Anti-plt agents, and anticoagulants Denies family history of gastrointestinal disease and malignancy   07/14/2019: CSY: PH colon polyps: Impression: - Two 5 to 8 mm polyps in the sigmoid colon and in the cecum, removed with a cold snare. Resected and retrieved.- Non-bleeding internal hemorrhoids. - The examination was otherwise normal. Path: one adenomatous and one juvenile polyp. No repeat.    No current facility-administered medications for this encounter.  Medications Prior to Admission  Medication Sig Dispense Refill Last Dose/Taking   albuterol  (VENTOLIN  HFA) 108 (90 Base) MCG/ACT inhaler Inhale 2 puffs into the lungs every 6 (six) hours as needed for wheezing or shortness of breath.       amLODipine  (NORVASC ) 2.5 MG tablet Take 2.5 mg by mouth daily.      budesonide -formoterol  (SYMBICORT ) 160-4.5 MCG/ACT inhaler Inhale 2 puffs into the lungs 2 (two) times daily.      calcium  carbonate (OSCAL) 1500 (600 Ca) MG TABS tablet Take 1,500 mg by mouth daily.      citalopram  (CELEXA ) 10 MG tablet Take 10 mg by mouth daily.      dorzolamide -timolol  (COSOPT ) 22.3-6.8 MG/ML ophthalmic solution Place 1 drop into the right eye 2 (two) times daily.       esomeprazole (NEXIUM) 40 MG capsule Take 40 mg by mouth daily.      ezetimibe  (ZETIA ) 10 MG tablet Take 10 mg by mouth daily.      FERREX 150 150 MG capsule Take 150 mg by mouth every other day.      guaiFENesin -dextromethorphan  (ROBITUSSIN DM) 100-10 MG/5ML syrup Take 5 mLs by mouth every 6 (six) hours as needed for cough. 118 mL 0    latanoprost  (XALATAN ) 0.005 % ophthalmic solution Place 1 drop into the right eye at bedtime.       losartan  (COZAAR ) 50 MG tablet Take 50 mg by mouth daily.      montelukast  (SINGULAIR ) 10 MG tablet Take 10 mg by mouth at bedtime.      Multiple Vitamins-Minerals (PRESERVISION AREDS) TABS Take 1 tablet by mouth 2 (two) times daily.       RHOPRESSA 0.02 % SOLN Place 1 drop into the right eye at bedtime.      vitamin B-12 (CYANOCOBALAMIN ) 1000 MCG tablet Take 1,000 mcg by  mouth daily.        Allergies  Allergen Reactions   Apraclonidine Rash   Lifitegrast Rash and Swelling    Eye swelling   Cheese Other (See Comments)    Sinus congestion   Chocolate Flavoring Agent (Non-Screening) Other (See Comments)    Sinus congestion   Milk (Cow) Other (See Comments)    Sinus congestion    Brimonidine Rash   Clarithromycin Rash   Clindamycin/Lincomycin Swelling and Rash   Levofloxacin Rash   Nickel Rash   Penicillins Rash   Polytrim [Polymyxin B -Trimethoprim] Rash   Statins Rash   Sulfamethoxazole Rash   Sulfonamide Derivatives Rash   Tobramycin Rash     Past Medical History:  Diagnosis Date   Asthma     Slight   COPD (chronic obstructive pulmonary disease) (HCC)    GERD (gastroesophageal reflux disease)    Glaucoma    Heart murmur    Hyperlipidemia    Hypertension    Macular degeneration    OSA (obstructive sleep apnea)    not on CPAP    Osteoporosis     Review of systems:  Otherwise negative.    Physical Exam  Gen: Alert, oriented. Appears stated age.  HEENT: [place]/AT. PERRLA. Lungs: CTA, no wheezes. CV: RR nl S1, S2. Abd: soft, benign, no masses. BS+ Ext: No edema. Pulses 2+    Planned procedures: Proceed with EGD. The patient understands the nature of the planned procedure, indications, risks, alternatives and potential complications including but not limited to bleeding, infection, perforation, damage to internal organs and possible oversedation/side effects from anesthesia. The patient agrees and gives consent to proceed.  Please refer to procedure notes for findings, recommendations and patient disposition/instructions.     Latavion Halls K. Aundria, M.D. Gastroenterology 12/23/2023  9:02 AM

## 2023-12-23 NOTE — Transfer of Care (Signed)
 Immediate Anesthesia Transfer of Care Note  Patient: Sonya Gaines  Procedure(s) Performed: ESOPHAGOGASTRODUODENOSCOPY (EGD) WITH PROPOFOL  BIOPSY  Patient Location: PACU  Anesthesia Type:General  Level of Consciousness: awake and alert   Airway & Oxygen Therapy: Patient Spontanous Breathing and Patient connected to nasal cannula oxygen  Post-op Assessment: Report given to RN and Post -op Vital signs reviewed and stable  Post vital signs: Reviewed and stable  Last Vitals:  Vitals Value Taken Time  BP    Temp    Pulse 70 12/23/23 1017  Resp 18 12/23/23 1017  SpO2 100 % 12/23/23 1017  Vitals shown include unfiled device data.  Last Pain:  Vitals:   12/23/23 0917  TempSrc: Temporal  PainSc: 0-No pain         Complications: No notable events documented.

## 2023-12-23 NOTE — Op Note (Signed)
 [facility] Gastroenterology Patient Name: Sonya Gaines Procedure Date: 12/23/2023 10:05 AM MRN: [id number] Account #: [id number] Date of Birth: [birth year] Admit Type: Outpatient Age: 78 Room: [facility] ENDO ROOM 3 Gender: Female Note Status: Finalized Instrument Name: Upper Endoscope [id number] Procedure:             Upper GI endoscopy Indications:           Coffee-ground emesis, Hematemesis, Nausea Providers:             Nazaiah Navarrete K. Aundria MD, MD Referring MD:          Tamra Leventhal, MD (Referring MD) Medicines:             Propofol  per Anesthesia Complications:         No immediate complications. Estimated blood loss:                         Minimal. Procedure:             Pre-Anesthesia Assessment:                        - The risks and benefits of the procedure and the                         sedation options and risks were discussed with the                         patient. All questions were answered and informed                         consent was obtained.                        - Patient identification and proposed procedure were                         verified prior to the procedure by the nurse. The                         procedure was verified in the procedure room.                        - ASA Grade Assessment: III - A patient with severe                         systemic disease.                        - After reviewing the risks and benefits, the patient                         was deemed in satisfactory condition to undergo the                         procedure.                        After obtaining informed consent, the endoscope was                         passed under  direct vision. Throughout the procedure,                         the patient's blood pressure, pulse, and oxygen                         saturations were monitored continuously. The Endoscope                         was introduced through the mouth, and advanced to the                          third part of duodenum. The upper GI endoscopy was                         accomplished without difficulty. The patient tolerated                         the procedure well. Findings:      The esophagus was normal.      Multiple medium pedunculated and sessile polyps with no bleeding and no       stigmata of recent bleeding were found in the gastric body. Biopsies       were taken with a cold forceps for histology.      Scattered mild inflammation characterized by congestion (edema) and       erythema was found in the gastric antrum.      The exam of the stomach was otherwise normal.      The examined duodenum was normal. Impression:            - Normal esophagus.                        - Multiple gastric polyps. Biopsied.                        - Gastritis.                        - Normal examined duodenum. Recommendation:        - Patient has a contact number available for                         emergencies. The signs and symptoms of potential                         delayed complications were discussed with the patient.                         Return to normal activities tomorrow. Written                         discharge instructions were provided to the patient.                        - Resume previous diet.                        - Continue present medications.                        -  Decrease Nexium to 40mg  ONCE daily before breakfast.                        - Return to GI office PRN.                        - The findings and recommendations were discussed with                         the patient. Procedure Code(s):     --- Professional ---                        [phone], Esophagogastroduodenoscopy, flexible,                         transoral; with biopsy, single or multiple Diagnosis Code(s):     --- Professional ---                        R11.0, Nausea                        K92.0, Hematemesis                        K29.70, Gastritis, unspecified, without  bleeding                        K31.7, Polyp of stomach and duodenum CPT copyright 2022 American [employer]. All rights reserved. The codes documented in this report are preliminary and upon coder review may  be revised to meet current compliance requirements. Ladell MARLA Boss MD, MD 12/23/2023 10:20:46 AM This report has been signed electronically. Number of Addenda: 0 Note Initiated On: 12/23/2023 10:05 AM Estimated Blood Loss:  Estimated blood loss was minimal.      [facility]

## 2023-12-23 NOTE — Anesthesia Postprocedure Evaluation (Signed)
 Anesthesia Post Note  Patient: Sonya Gaines  Procedure(s) Performed: ESOPHAGOGASTRODUODENOSCOPY (EGD) WITH PROPOFOL  BIOPSY  Patient location during evaluation: Endoscopy Anesthesia Type: General Level of consciousness: awake and alert Pain management: pain level controlled Vital Signs Assessment: post-procedure vital signs reviewed and stable Respiratory status: spontaneous breathing, nonlabored ventilation, respiratory function stable and patient connected to nasal cannula oxygen Cardiovascular status: blood pressure returned to baseline and stable Postop Assessment: no apparent nausea or vomiting Anesthetic complications: no   No notable events documented.   Last Vitals:  Vitals:   12/23/23 1039 12/23/23 1040  BP: 95/82   Pulse: 66 68  Resp: 15 14  Temp:    SpO2: 100% 100%    Last Pain:  Vitals:   12/23/23 0917  TempSrc: Temporal  PainSc: 0-No pain                 [name]

## 2023-12-23 NOTE — Anesthesia Preprocedure Evaluation (Signed)
 Anesthesia Evaluation  Patient identified by MRN, date of birth, ID band Patient awake  General Assessment Comment:Wheezing noted on exam, patient took 2 puffs of home inhaler  Reviewed: Allergy & Precautions, NPO status , Patient's Chart, lab work & pertinent test results  History of Anesthesia Complications Negative for: history of anesthetic complications  Airway Mallampati: III  TM Distance: >3 FB Neck ROM: full    Dental no notable dental hx.    Pulmonary asthma , sleep apnea and Continuous Positive Airway Pressure Ventilation , COPD,  COPD inhaler, former smoker    + wheezing      Cardiovascular hypertension, On Medications Normal cardiovascular exam     Neuro/Psych negative neurological ROS  negative psych ROS   GI/Hepatic Neg liver ROS,GERD  Medicated,,  Endo/Other  negative endocrine ROS    Renal/GU negative Renal ROS  negative genitourinary   Musculoskeletal   Abdominal   Peds  Hematology negative hematology ROS (+)   Anesthesia Other Findings Past Medical History: No date: Asthma     Comment:  Slight No date: COPD (chronic obstructive pulmonary disease) (HCC) No date: GERD (gastroesophageal reflux disease) No date: Glaucoma No date: Heart murmur No date: Hyperlipidemia No date: Hypertension No date: Macular degeneration No date: OSA (obstructive sleep apnea)     Comment:  not on CPAP  No date: Osteoporosis  Past Surgical History: No date: ABDOMINAL HYSTERECTOMY YRS AGO: BREAST CYST ASPIRATION; Right 07/14/2019: COLONOSCOPY WITH PROPOFOL ; N/A     Comment:  Procedure: COLONOSCOPY WITH PROPOFOL ;  Surgeon: [place],               Ladell POUR, MD;  Location: [facility] ENDOSCOPY;  Service:               Endoscopy;  Laterality: N/A; No date: GLAUCOMA SURGERY     Comment:  x 2  03/12/2017: HIP PINNING,CANNULATED; Right     Comment:  Procedure: CANNULATED HIP PINNING;  Surgeon: Franky Cranker,  MD;  Location: [facility] ORS;  Service:               Orthopedics;  Laterality: Right; No date: NASAL SINUS SURGERY     Comment:  x 3  No date: VESICOVAGINAL FISTULA CLOSURE W/ TAH  BMI    Body Mass Index: 23.83 kg/m      Reproductive/Obstetrics negative OB ROS                             Anesthesia Physical Anesthesia Plan  ASA: 3  Anesthesia Plan: General   Post-op Pain Management: Minimal or no pain anticipated   Induction: Intravenous  PONV Risk Score and Plan: 2 and Propofol  infusion and TIVA  Airway Management Planned: Natural Airway and Nasal Cannula  Additional Equipment:   Intra-op Plan:   Post-operative Plan:   Informed Consent: I have reviewed the patients History and Physical, chart, labs and discussed the procedure including the risks, benefits and alternatives for the proposed anesthesia with the patient or authorized representative who has indicated his/her understanding and acceptance.     Dental Advisory Given  Plan Discussed with: Anesthesiologist, CRNA and Surgeon  Anesthesia Plan Comments: (Patient consented for risks of anesthesia including but not limited to:  - adverse reactions to medications - risk of airway placement if required - damage to eyes, teeth, lips or other oral mucosa - nerve damage due to  positioning  - sore throat or hoarseness - Damage to heart, brain, nerves, lungs, other parts of body or loss of life  Patient voiced understanding and assent.)        Anesthesia Quick Evaluation

## 2023-12-23 NOTE — Interval H&P Note (Signed)
 History and Physical Interval Note:  12/23/2023 9:04 AM  Sonya Gaines  has presented today for surgery, with the diagnosis of K21.9 (ICD-10-CM) - Gastroesophageal reflux disease without esophagitis Z87.19 (ICD-10-CM) - History of melena Z87.19 (ICD-10-CM) - History of hematemesis.  The various methods of treatment have been discussed with the patient and family. After consideration of risks, benefits and other options for treatment, the patient has consented to  Procedure(s): ESOPHAGOGASTRODUODENOSCOPY (EGD) WITH PROPOFOL  (N/A) as a surgical intervention.  The patient's history has been reviewed, patient examined, no change in status, stable for surgery.  I have reviewed the patient's chart and labs.  Questions were answered to the patient's satisfaction.     [place], Angelito Hopping

## 2023-12-23 NOTE — Anesthesia Procedure Notes (Signed)
 Date/Time: 12/23/2023 10:07 AM  Performed by: Duwayne Craven, CRNAPre-anesthesia Checklist: Patient identified, Emergency Drugs available, Suction available, Patient being monitored and Timeout performed Patient Re-evaluated:Patient Re-evaluated prior to induction Oxygen Delivery Method: Nasal cannula Induction Type: IV induction Airway Equipment and Method: Bite block Placement Confirmation: CO2 detector and positive ETCO2

## 2023-12-24 ENCOUNTER — Encounter: Payer: Self-pay | Admitting: Internal Medicine

## 2023-12-25 LAB — SURGICAL PATHOLOGY

## 2024-01-03 ENCOUNTER — Emergency Department: Payer: Medicare PPO

## 2024-01-03 ENCOUNTER — Observation Stay (HOSPITAL_BASED_OUTPATIENT_CLINIC_OR_DEPARTMENT_OTHER)
Admission: EM | Admit: 2024-01-03 | Discharge: 2024-01-04 | Disposition: A | Discharge status: Home / Self Care | Payer: Medicare PPO | Source: Home / Self Care | Attending: Emergency Medicine | Admitting: Emergency Medicine

## 2024-01-03 ENCOUNTER — Other Ambulatory Visit: Payer: Self-pay

## 2024-01-03 DIAGNOSIS — I10 Essential (primary) hypertension: Secondary | ICD-10-CM | POA: Insufficient documentation

## 2024-01-03 DIAGNOSIS — J441 Chronic obstructive pulmonary disease with (acute) exacerbation: Secondary | ICD-10-CM | POA: Insufficient documentation

## 2024-01-03 DIAGNOSIS — R296 Repeated falls: Secondary | ICD-10-CM | POA: Insufficient documentation

## 2024-01-03 DIAGNOSIS — Z1152 Encounter for screening for COVID-19: Secondary | ICD-10-CM | POA: Insufficient documentation

## 2024-01-03 DIAGNOSIS — H409 Unspecified glaucoma: Secondary | ICD-10-CM | POA: Insufficient documentation

## 2024-01-03 DIAGNOSIS — Z79899 Other long term (current) drug therapy: Secondary | ICD-10-CM | POA: Insufficient documentation

## 2024-01-03 DIAGNOSIS — R531 Weakness: Secondary | ICD-10-CM | POA: Diagnosis not present

## 2024-01-03 DIAGNOSIS — R54 Age-related physical debility: Secondary | ICD-10-CM | POA: Insufficient documentation

## 2024-01-03 DIAGNOSIS — J101 Influenza due to other identified influenza virus with other respiratory manifestations: Secondary | ICD-10-CM | POA: Insufficient documentation

## 2024-01-03 DIAGNOSIS — J1000 Influenza due to other identified influenza virus with unspecified type of pneumonia: Secondary | ICD-10-CM | POA: Diagnosis not present

## 2024-01-03 DIAGNOSIS — J449 Chronic obstructive pulmonary disease, unspecified: Secondary | ICD-10-CM | POA: Insufficient documentation

## 2024-01-03 DIAGNOSIS — Z87891 Personal history of nicotine dependence: Secondary | ICD-10-CM | POA: Insufficient documentation

## 2024-01-03 LAB — CBC
HCT: 39.4 % (ref 36.0–46.0)
Hemoglobin: 13 g/dL (ref 12.0–15.0)
MCH: 27.6 pg (ref 26.0–34.0)
MCHC: 33 g/dL (ref 30.0–36.0)
MCV: 83.7 fL (ref 80.0–100.0)
Platelets: 211 10*3/uL (ref 150–400)
RBC: 4.71 MIL/uL (ref 3.87–5.11)
RDW: 13.5 % (ref 11.5–15.5)
WBC: 9.3 10*3/uL (ref 4.0–10.5)
nRBC: 0 % (ref 0.0–0.2)

## 2024-01-03 LAB — BASIC METABOLIC PANEL
Anion gap: 13 (ref 5–15)
BUN: 16 mg/dL (ref 8–23)
CO2: 23 mmol/L (ref 22–32)
Calcium: 9.2 mg/dL (ref 8.9–10.3)
Chloride: 99 mmol/L (ref 98–111)
Creatinine, Ser: 0.98 mg/dL (ref 0.44–1.00)
GFR, Estimated: 59 mL/min — ABNORMAL LOW (ref >60)
Glucose, Bld: 120 mg/dL — ABNORMAL HIGH (ref 70–99)
Potassium: 3.8 mmol/L (ref 3.5–5.1)
Sodium: 135 mmol/L (ref 135–145)

## 2024-01-03 LAB — RESP PANEL BY RT-PCR (RSV, FLU A&B, COVID)  RVPGX2
Influenza A by PCR: POSITIVE — AB
Influenza B by PCR: NEGATIVE
Resp Syncytial Virus by PCR: NEGATIVE
SARS Coronavirus 2 by RT PCR: NEGATIVE

## 2024-01-03 LAB — TSH: TSH: 0.741 u[IU]/mL (ref 0.350–4.500)

## 2024-01-03 LAB — TROPONIN I (HIGH SENSITIVITY): Troponin I (High Sensitivity): 6 ng/L (ref <18)

## 2024-01-03 MED ORDER — OSELTAMIVIR PHOSPHATE 30 MG PO CAPS
30.0000 mg | ORAL_CAPSULE | Freq: Two times a day (BID) | ORAL | Status: DC
  Administered 2024-01-04: 30 mg via ORAL
  Filled 2024-01-03: qty 1

## 2024-01-03 MED ORDER — DORZOLAMIDE HCL-TIMOLOL MAL 2-0.5 % OP SOLN
1.0000 [drp] | Freq: Two times a day (BID) | OPHTHALMIC | Status: DC
  Filled 2024-01-03: qty 10

## 2024-01-03 MED ORDER — MOMETASONE FURO-FORMOTEROL FUM 200-5 MCG/ACT IN AERO
2.0000 | INHALATION_SPRAY | Freq: Two times a day (BID) | RESPIRATORY_TRACT | Status: DC
  Administered 2024-01-03 – 2024-01-04 (×2): 2 via RESPIRATORY_TRACT
  Filled 2024-01-03: qty 8.8

## 2024-01-03 MED ORDER — ENOXAPARIN SODIUM 40 MG/0.4ML IJ SOSY
40.0000 mg | PREFILLED_SYRINGE | INTRAMUSCULAR | Status: DC
  Administered 2024-01-03: 40 mg via SUBCUTANEOUS
  Filled 2024-01-03: qty 0.4

## 2024-01-03 MED ORDER — NETARSUDIL DIMESYLATE 0.02 % OP SOLN: 1.0000 [drp] | Freq: Every day | OPHTHALMIC | Status: DC

## 2024-01-03 MED ORDER — SODIUM CHLORIDE 0.9 % IV BOLUS
500.0000 mL | Freq: Once | INTRAVENOUS | Status: AC
  Administered 2024-01-03: 500 mL via INTRAVENOUS

## 2024-01-03 MED ORDER — ACETAMINOPHEN 650 MG RE SUPP: 650.0000 mg | Freq: Four times a day (QID) | RECTAL | Status: DC | PRN

## 2024-01-03 MED ORDER — PREDNISONE 20 MG PO TABS
40.0000 mg | ORAL_TABLET | Freq: Every day | ORAL | Status: DC
  Administered 2024-01-03 – 2024-01-04 (×2): 40 mg via ORAL
  Filled 2024-01-03 (×2): qty 2

## 2024-01-03 MED ORDER — ACETAMINOPHEN 325 MG PO TABS: 650.0000 mg | ORAL_TABLET | Freq: Four times a day (QID) | ORAL | Status: DC | PRN

## 2024-01-03 MED ORDER — LATANOPROST 0.005 % OP SOLN
1.0000 [drp] | Freq: Every day | OPHTHALMIC | Status: DC
  Filled 2024-01-03: qty 2.5

## 2024-01-03 MED ORDER — DEXTROSE-SODIUM CHLORIDE 5-0.45 % IV SOLN: INTRAVENOUS | Status: AC

## 2024-01-03 MED ORDER — OSELTAMIVIR PHOSPHATE 75 MG PO CAPS
75.0000 mg | ORAL_CAPSULE | Freq: Once | ORAL | Status: AC
Start: 2024-01-03 — End: 2024-01-03
  Administered 2024-01-03: 75 mg via ORAL
  Filled 2024-01-03: qty 1

## 2024-01-03 NOTE — ED Provider Notes (Signed)
 [facility] Provider Note    Event Date/Time   First MD Initiated Contact with Patient 01/03/24 [phone]     (approximate)   History   Fall   HPI  Sonya Gaines is a 78 y.o. female with history of hypertension, hyperlipidemia, COPD, OSA, GERD, and osteoporosis who presents with generalized weakness, falls, and cough.  The patient states that she has had some dry, nonproductive cough for about the last 2 weeks associated with worsening generalized weakness that has escalated over the last 5 days or so.  The patient was seen in urgent care last week and had a negative viral panel.  Since that time she has become weaker.  She reports the cough but denies any shortness of breath when she is not coughing.  She has had some elevated temperatures to around 99.  She denies any vomiting, diarrhea, or abdominal pain.  The patient states that she has fallen because she just felt generally weak and like her body gave out, rather than feeling like she was going to pass out or actually losing consciousness.  She denies hitting her head or any other injuries from falling.  I reviewed the past medical records.  The patient's most recent hospitalization was in 2022 with the hospitalist service for sepsis due to pneumonia.  She was seen at [facility] clinic on 2/11 with hypotension and generalized weakness.  Respiratory panel was negative at that time.   Physical Exam   Triage Vital Signs: ED Triage Vitals  Encounter Vitals Group     BP 01/03/24 0736 116/75     Systolic BP Percentile --      Diastolic BP Percentile --      Pulse Rate 01/03/24 0736 (!) 105     Resp 01/03/24 0736 20     Temp 01/03/24 0736 98.3 F (36.8 C)     Temp src --      SpO2 01/03/24 0736 95 %     Weight 01/03/24 0738 121 lb 4.1 oz (55 kg)     Height 01/03/24 0738 5' (1.524 m)     Head Circumference --      Peak Flow --      Pain Score 01/03/24 0737 0     Pain Loc --      Pain Education --       Exclude from Growth Chart --     Most recent vital signs: Vitals:   01/03/24 1400 01/03/24 1430  BP: (!) 131/54 (!) 118/59  Pulse: 74 77  Resp: (!) 21 18  Temp:    SpO2: 96% 95%     General: Alert, weak appearing but in no distress.  CV:  Good peripheral perfusion.  Resp:  Normal effort.  Lungs CTAB. Abd:  Soft and nontender.  No distention.  Other:  Left pupil obscured.  EOMI.  No facial droop.  Normal speech.  Motor intact in all extremities.  No ataxia on finger-to-nose.  No pronator drift.  Dry mucous membranes.   ED Results / Procedures / Treatments   Labs (all labs ordered are listed, but only abnormal results are displayed) Labs Reviewed  RESP PANEL BY RT-PCR (RSV, FLU A&B, COVID)  [id number] - Abnormal; Notable for the following components:      Result Value   Influenza A by PCR POSITIVE (*)    All other components within normal limits  BASIC METABOLIC PANEL - Abnormal; Notable for the following components:   Glucose, Bld 120 (*)  GFR, Estimated 59 (*)    All other components within normal limits  CBC  TSH  TROPONIN I (HIGH SENSITIVITY)     EKG  ED ECG REPORT I, Dionne Bucy, the attending physician, personally viewed and interpreted this ECG.  Date: 01/03/2024 EKG Time: 0744 Rate: 78 Rhythm: normal sinus rhythm QRS Axis: normal Intervals: normal ST/T Wave abnormalities: Nonspecific ST abnormalities Narrative Interpretation: no evidence of acute ischemia    RADIOLOGY  Chest x-ray: I independently viewed and interpreted the images; there is no focal consolidation or edema   PROCEDURES:  Critical Care performed: No  Procedures   MEDICATIONS ORDERED IN ED: Medications  enoxaparin (LOVENOX) injection 40 mg (has no administration in time range)  dextrose 5 % and 0.45 % NaCl infusion ( Intravenous New Bag/Given 01/03/24 1306)  acetaminophen (TYLENOL) tablet 650 mg (has no administration in time range)    Or  acetaminophen (TYLENOL)  suppository 650 mg (has no administration in time range)  predniSONE (DELTASONE) tablet 40 mg (40 mg Oral Given 01/03/24 1304)  oseltamivir (TAMIFLU) capsule 30 mg (has no administration in time range)  sodium chloride 0.9 % bolus 500 mL (0 mLs Intravenous Stopped 01/03/24 1040)  sodium chloride 0.9 % bolus 500 mL (0 mLs Intravenous Stopped 01/03/24 1255)  oseltamivir (TAMIFLU) capsule 75 mg (75 mg Oral Given 01/03/24 1304)     IMPRESSION / MDM / ASSESSMENT AND PLAN / ED COURSE  I reviewed the triage vital signs and the nursing notes.  78 year old female with PMH as noted above presents with cough and generalized weakness that have worsened over the last week, resulting in several falls but with no head or other injuries.  Differential diagnosis includes, but is not limited to, influenza, COVID, other viral syndrome, bacterial pneumonia, UTI or other infection, dehydration, electrolyte abnormality, AKI, other metabolic cause, less likely cardiac etiology.  BMP and CBC were obtained and are unremarkable.  I have added on a respiratory panel, cardiac enzymes, and urine, TSH, and chest x-ray.  Patient's presentation is most consistent with acute complicated illness / injury requiring diagnostic workup.  ----------------------------------------- 3:54 PM on 01/03/2024 -----------------------------------------  The patient is positive for influenza A.  BMP and CBC show no acute findings.  I discussed disposition with the patient.  She feels very weak, is concerned about the recurrent falls, and states she is not well enough to go home.  I will admit her for further management.  I consulted Dr. Ashok Pall from the hospitalist service; based on our discussion he agrees to evaluate the patient for admission.  FINAL CLINICAL IMPRESSION(S) / ED DIAGNOSES   Final diagnoses:  Influenza A  Falls     Rx / DC Orders   ED Discharge Orders     None        Note:  This document was prepared using Dragon  voice recognition software and may include unintentional dictation errors.    Dionne Bucy, MD 01/03/24 1555

## 2024-01-03 NOTE — Plan of Care (Signed)
   Problem: Clinical Measurements: Goal: Ability to maintain clinical measurements within normal limits will improve Outcome: Progressing

## 2024-01-03 NOTE — ED Triage Notes (Signed)
 Pt to ED for 4 falls last night. Denies hitting head or LOC, no blood thinner use. Reports falling d/t weakness. Reports seen at [facility] recently for not feeling well, tested neg for viral panel  Reports cough and dry throat

## 2024-01-03 NOTE — H&P (Signed)
 History and Physical    Sonya Gaines WJX:[id number] DOB: [birth year] DOA: 01/03/2024  PCP: Barbette Reichmann, MD  Patient coming from: home   Chief Complaint: weakness  HPI: Sonya Gaines is a 78 y.o. female with medical history significant for copd/asthma, htn, who presents with the above.  Symptoms began about a week ago, generally feeling weak and debilitated, also some cough. No sig sob. No nausea/vomiting, is tolerating PO. No chest pain or fevers. No dysuria. Generalized weakness has worsened and last night had three falls onto her bottom, denies pain or head trauma. Saw PCP this past week, respiratory testing negative there.     Review of Systems: As per HPI otherwise 10 point review of systems negative.    Past Medical History:  Diagnosis Date   Asthma    Slight   COPD (chronic obstructive pulmonary disease) (HCC)    GERD (gastroesophageal reflux disease)    Glaucoma    Heart murmur    Hyperlipidemia    Hypertension    Macular degeneration    OSA (obstructive sleep apnea)    not on CPAP    Osteoporosis     Past Surgical History:  Procedure Laterality Date   ABDOMINAL HYSTERECTOMY     BIOPSY  12/23/2023   Procedure: BIOPSY;  Surgeon: Toledo, Boykin Nearing, MD;  Location: [facility];  Service: Gastroenterology;;   BREAST CYST ASPIRATION Right YRS AGO   COLONOSCOPY WITH PROPOFOL N/A 07/14/2019   Procedure: COLONOSCOPY WITH PROPOFOL;  Surgeon: Toledo, Boykin Nearing, MD;  Location: [facility];  Service: Endoscopy;  Laterality: N/A;   ESOPHAGOGASTRODUODENOSCOPY (EGD) WITH PROPOFOL N/A 12/23/2023   Procedure: ESOPHAGOGASTRODUODENOSCOPY (EGD) WITH PROPOFOL;  Surgeon: Toledo, Boykin Nearing, MD;  Location: [facility];  Service: Gastroenterology;  Laterality: N/A;   GLAUCOMA SURGERY     x 2    HIP PINNING,CANNULATED Right 03/12/2017   Procedure: CANNULATED HIP PINNING;  Surgeon: Juanell Fairly, MD;  Location: [facility];  Service: Orthopedics;  Laterality: Right;   NASAL  SINUS SURGERY     x 3    VESICOVAGINAL FISTULA CLOSURE W/ TAH       reports that she quit smoking about 32 years ago. Her smoking use included cigarettes. She started smoking about 62 years ago. She has a 45 pack-year smoking history. She has never used smokeless tobacco. She reports that she does not drink alcohol and does not use drugs.  Allergies  Allergen Reactions   Apraclonidine Rash   Lifitegrast Rash and Swelling    Eye swelling   Cheese Other (See Comments)    Sinus congestion   Chocolate Flavoring Agent (Non-Screening) Other (See Comments)    Sinus congestion   Milk (Cow) Other (See Comments)    Sinus congestion    Brimonidine Rash   Clarithromycin Rash   Clindamycin/Lincomycin Swelling and Rash   Levofloxacin Rash   Nickel Rash   Penicillins Rash   Polytrim [Polymyxin B-Trimethoprim] Rash   Statins Rash   Sulfamethoxazole Rash   Sulfonamide Derivatives Rash   Tobramycin Rash    Family History  Problem Relation Age of Onset   Emphysema Father        smoked   Heart disease Father    Heart disease Brother    Breast cancer Maternal Aunt    Lung cancer Paternal Grandfather        never smoker   Lung cancer Son        never smoker    Prior to Admission medications  Medication Sig Start Date End Date Taking? Authorizing Provider  albuterol (VENTOLIN HFA) 108 (90 Base) MCG/ACT inhaler Inhale 2 puffs into the lungs every 6 (six) hours as needed for wheezing or shortness of breath.    [provider]  amLODipine (NORVASC) 2.5 MG tablet Take 2.5 mg by mouth daily. 06/27/21 12/23/23  [provider]  budesonide-formoterol (SYMBICORT) 160-4.5 MCG/ACT inhaler Inhale 2 puffs into the lungs 2 (two) times daily.    [provider]  calcium carbonate (OSCAL) 1500 (600 Ca) MG TABS tablet Take 1,500 mg by mouth daily.    [provider]  citalopram (CELEXA) 10 MG tablet Take 10 mg by mouth daily. 07/11/21   [provider]   dorzolamide-timolol (COSOPT) 22.3-6.8 MG/ML ophthalmic solution Place 1 drop into the right eye 2 (two) times daily.     [provider]  esomeprazole (NEXIUM) 40 MG capsule Take 40 mg by mouth daily. 11/05/16   [provider]  ezetimibe (ZETIA) 10 MG tablet Take 10 mg by mouth daily.    [provider]  FERREX 150 150 MG capsule Take 150 mg by mouth every other day. 09/12/21   [provider]  guaiFENesin-dextromethorphan (ROBITUSSIN DM) 100-10 MG/5ML syrup Take 5 mLs by mouth every 6 (six) hours as needed for cough. 10/25/21   Dahal, Melina Schools, MD  latanoprost (XALATAN) 0.005 % ophthalmic solution Place 1 drop into the right eye at bedtime.     [provider]  losartan (COZAAR) 50 MG tablet Take 50 mg by mouth daily.    [provider]  montelukast (SINGULAIR) 10 MG tablet Take 10 mg by mouth at bedtime.    [provider]  Multiple Vitamins-Minerals (PRESERVISION AREDS) TABS Take 1 tablet by mouth 2 (two) times daily.     [provider]  RHOPRESSA 0.02 % SOLN Place 1 drop into the right eye at bedtime. 10/08/21   [provider]  vitamin B-12 (CYANOCOBALAMIN) 1000 MCG tablet Take 1,000 mcg by mouth daily.    [provider]    Physical Exam: Vitals:   01/03/24 0736 01/03/24 0738 01/03/24 1040  BP: 116/75  (!) 103/49  Pulse: (!) 105  91  Resp: 20  (!) 23  Temp: 98.3 F (36.8 C)    SpO2: 95%  95%  Weight:  55 kg   Height:  5' (1.524 m)     Constitutional: No acute distress Head: Atraumatic ENM: Moist mucous membranes. Normal dentition.  Neck: Supple Respiratory: exp wheeze Cardiovascular: Regular rate and rhythm. No murmurs/rubs/gallops. Abdomen: Non-tender, non-distended. No masses  Musculoskeletal: No joint deformity upper and lower extremities.   Skin: No rashes, lesions, or ulcers.  Extremities: No peripheral edema. Palpable peripheral pulses. Neurologic: Alert, moving all 4  extremities. Psychiatric: Normal insight and judgement.   Labs on Admission: I have personally reviewed following labs and imaging studies  CBC: Recent Labs  Lab 01/03/24 0739  WBC 9.3  HGB 13.0  HCT 39.4  MCV 83.7  PLT 211   Basic Metabolic Panel: Recent Labs  Lab 01/03/24 0739  NA 135  K 3.8  CL 99  CO2 23  GLUCOSE 120*  BUN 16  CREATININE 0.98  CALCIUM 9.2   GFR: Estimated Creatinine Clearance: 37.4 mL/min (by C-G formula based on SCr of 0.98 mg/dL). Liver Function Tests: No results for input(s): "AST", "ALT", "ALKPHOS", "BILITOT", "PROT", "ALBUMIN" in the last 168 hours. No results for input(s): "LIPASE", "AMYLASE" in the last 168 hours. No results for  input(s): "AMMONIA" in the last 168 hours. Coagulation Profile: No results for input(s): "INR", "PROTIME" in the last 168 hours. Cardiac Enzymes: No results for input(s): "CKTOTAL", "CKMB", "CKMBINDEX", "TROPONINI" in the last 168 hours. BNP (last 3 results) No results for input(s): "PROBNP" in the last 8760 hours. HbA1C: No results for input(s): "HGBA1C" in the last 72 hours. CBG: No results for input(s): "GLUCAP" in the last 168 hours. Lipid Profile: No results for input(s): "CHOL", "HDL", "LDLCALC", "TRIG", "CHOLHDL", "LDLDIRECT" in the last 72 hours. Thyroid Function Tests: Recent Labs    01/03/24 0850  TSH 0.741   Anemia Panel: No results for input(s): "VITAMINB12", "FOLATE", "FERRITIN", "TIBC", "IRON", "RETICCTPCT" in the last 72 hours. Urine analysis:    Component Value Date/Time   COLORURINE YELLOW (A) 10/22/2021 1500   APPEARANCEUR CLEAR (A) 10/22/2021 1500   LABSPEC 1.021 10/22/2021 1500   PHURINE 6.0 10/22/2021 1500   GLUCOSEU NEGATIVE 10/22/2021 1500   HGBUR NEGATIVE 10/22/2021 1500   BILIRUBINUR NEGATIVE 10/22/2021 1500   KETONESUR NEGATIVE 10/22/2021 1500   PROTEINUR NEGATIVE 10/22/2021 1500   NITRITE NEGATIVE 10/22/2021 1500   LEUKOCYTESUR NEGATIVE 10/22/2021 1500    Radiological  Exams on Admission: DG Chest 2 View Result Date: 01/03/2024 CLINICAL DATA:  Cough.  Weakness and falls. EXAM: CHEST - 2 VIEW COMPARISON:  10/22/2021 FINDINGS: Normal heart size. No pleural fluid, interstitial edema, or airspace consolidation. The visualized osseous structures are unremarkable. IMPRESSION: No acute cardiopulmonary disease. Electronically Signed   By: Signa Kell M.D.   On: 01/03/2024 09:47    EKG: Independently reviewed. nsr  Assessment/Plan Principal Problem:   Influenza A Active Problems:   Essential (primary) hypertension   COPD (chronic obstructive pulmonary disease) (HCC)    # Influenza A With debility. No pneumonia, cxr clear. With wheeze and asthma/copd - start tamiflu - steroids as below - cont fluids  # Asthma/copd with acute exacerbation Nothing focal on cxr, no o2 requirement - start prednisone  # Debility 2/2 influenza, nothing focal - PT consult  # HTN Bp low normal in setting of acute illness - hold home meds  # Glaucoma - home meds after med rec  DVT prophylaxis: lovenox Code Status: do not intubate but willing to undergo cpr and chest compressions  Family Communication: daughter amy updated telephonically 2/15 Consults called: none  Level of care: Med-Surg Status is: Observation The patient remains OBS appropriate and will d/c before 2 midnights.    Silvano Bilis MD [facility] Pager [phone]  If 7PM-7AM, please contact night-coverage [email or web address] Password [email or web address]  01/03/2024, 12:00 PM

## 2024-01-03 NOTE — ED Notes (Signed)
 Tech came and got this RN and stated pt had lost consciousness while sitting and getting EKG obtained. RN to room and pt with pale color, diaphoretic, disoriented. Opened eyes to verbal stimuli. Taken to [facility]

## 2024-01-04 DIAGNOSIS — J101 Influenza due to other identified influenza virus with other respiratory manifestations: Secondary | ICD-10-CM | POA: Diagnosis not present

## 2024-01-04 LAB — BASIC METABOLIC PANEL
Anion gap: 7 (ref 5–15)
BUN: 10 mg/dL (ref 8–23)
CO2: 25 mmol/L (ref 22–32)
Calcium: 9.2 mg/dL (ref 8.9–10.3)
Chloride: 110 mmol/L (ref 98–111)
Creatinine, Ser: 0.52 mg/dL (ref 0.44–1.00)
GFR, Estimated: >60 mL/min (ref >60)
Glucose, Bld: 122 mg/dL — ABNORMAL HIGH (ref 70–99)
Potassium: 3.7 mmol/L (ref 3.5–5.1)
Sodium: 141 mmol/L (ref 135–145)

## 2024-01-04 LAB — CBC
HCT: 35.5 % — ABNORMAL LOW (ref 36.0–46.0)
Hemoglobin: 11.7 g/dL — ABNORMAL LOW (ref 12.0–15.0)
MCH: 27.6 pg (ref 26.0–34.0)
MCHC: 33 g/dL (ref 30.0–36.0)
MCV: 83.7 fL (ref 80.0–100.0)
Platelets: 189 10*3/uL (ref 150–400)
RBC: 4.24 MIL/uL (ref 3.87–5.11)
RDW: 13.6 % (ref 11.5–15.5)
WBC: 4.4 10*3/uL (ref 4.0–10.5)
nRBC: 0 % (ref 0.0–0.2)

## 2024-01-04 MED ORDER — OSELTAMIVIR PHOSPHATE 30 MG PO CAPS: 30.0000 mg | ORAL_CAPSULE | Freq: Two times a day (BID) | ORAL | 0 refills | Status: DC

## 2024-01-04 MED ORDER — PREDNISONE 20 MG PO TABS: 40.0000 mg | ORAL_TABLET | Freq: Every day | ORAL | 0 refills | Status: DC

## 2024-01-04 NOTE — Discharge Summary (Signed)
 Sonya Gaines:[id number] DOB: 25-May-1946 DOA: 01/03/2024  PCP: Barbette Reichmann, MD  Admit date: 01/03/2024 Discharge date: 01/04/2024  Time spent: 35 minutes  Recommendations for Outpatient Follow-up:  Pcp f/u     Discharge Diagnoses:  Principal Problem:   Influenza A Active Problems:   Essential (primary) hypertension   COPD (chronic obstructive pulmonary disease) (HCC)   Weakness   Discharge Condition: improved  Diet recommendation: heart healthy  Filed Weights   01/03/24 0738  Weight: 55 kg    History of present illness:  From admission h and p Symptoms began about a week ago, generally feeling weak and debilitated, also some cough. No sig sob. No nausea/vomiting, is tolerating PO. No chest pain or fevers. No dysuria. Generalized weakness has worsened and last night had three falls onto her bottom, denies pain or head trauma. Saw PCP this past week, respiratory testing negative there.   Hospital Course:  Patient presents with several days cough and weakness with falls the night before admission. Found to be influenza positive with copd/asthma exacerbation. No hypoxia or significant shortness of breath. Treated with IV fluids, prednisone, and tamiflu. Reports marked improvement in symptoms. Ambulated satisfactorily with PT (home health PT advised but patient declined). Tolerating diet, no hypoxia. Discharged home to complete a course of tamiflu and oral prednisone.   Procedures: none   Consultations: none  Discharge Exam: Vitals:   01/04/24 0357 01/04/24 0838  BP: (!) 144/71 (!) 167/82  Pulse: 70 86  Resp: 18 18  Temp: 97.9 F (36.6 C) 98.3 F (36.8 C)  SpO2: 98% 99%    General: NAD Cardiovascular: RRR Respiratory: few scattered rhonchi, normal WOB  Discharge Instructions   Discharge Instructions     Diet - low sodium heart healthy   Complete by: As directed    Increase activity slowly   Complete by: As directed       Allergies as of  01/04/2024       Reactions   Apraclonidine Rash   Lifitegrast Rash, Swelling   Eye swelling   Cheese Other (See Comments)   Sinus congestion   Chocolate Flavoring Agent (non-screening) Other (See Comments)   Sinus congestion   Milk (cow) Other (See Comments)   Sinus congestion   Brimonidine Rash   Clarithromycin Rash   Clindamycin/lincomycin Swelling, Rash   Levofloxacin Rash   Nickel Rash   Penicillins Rash   Polytrim [polymyxin B-trimethoprim] Rash   Statins Rash   Sulfamethoxazole Rash   Sulfonamide Derivatives Rash   Tobramycin Rash        Medication List     TAKE these medications    albuterol 108 (90 Base) MCG/ACT inhaler Commonly known as: VENTOLIN HFA Inhale 2 puffs into the lungs every 6 (six) hours as needed for wheezing or shortness of breath.   amLODipine 5 MG tablet Commonly known as: NORVASC Take 5 mg by mouth daily. What changed: Another medication with the same name was removed. Continue taking this medication, and follow the directions you see here.   brinzolamide 1 % ophthalmic suspension Commonly known as: AZOPT Place 1 drop into the right eye 2 (two) times daily.   budesonide-formoterol 160-4.5 MCG/ACT inhaler Commonly known as: SYMBICORT Inhale 2 puffs into the lungs 2 (two) times daily.   calcium carbonate 1500 (600 Ca) MG Tabs tablet Commonly known as: OSCAL Take 1,500 mg by mouth daily.   citalopram 10 MG tablet Commonly known as: CELEXA Take 10 mg by mouth daily.   cyanocobalamin 1000  MCG tablet Commonly known as: VITAMIN B12 Take 1,000 mcg by mouth daily.   dorzolamide-timolol 2-0.5 % ophthalmic solution Commonly known as: COSOPT Place 1 drop into the right eye 2 (two) times daily.   esomeprazole 40 MG capsule Commonly known as: NEXIUM Take 40 mg by mouth daily.   ezetimibe 10 MG tablet Commonly known as: ZETIA Take 10 mg by mouth daily.   Ferrex 150 150 MG capsule Generic drug: iron polysaccharides Take 150 mg by  mouth every other day.   guaiFENesin-dextromethorphan 100-10 MG/5ML syrup Commonly known as: ROBITUSSIN DM Take 5 mLs by mouth every 6 (six) hours as needed for cough.   latanoprost 0.005 % ophthalmic solution Commonly known as: XALATAN Place 1 drop into the right eye at bedtime.   losartan 50 MG tablet Commonly known as: COZAAR Take 50 mg by mouth daily.   montelukast 10 MG tablet Commonly known as: SINGULAIR Take 10 mg by mouth at bedtime.   oseltamivir 30 MG capsule Commonly known as: Tamiflu Take 1 capsule (30 mg total) by mouth 2 (two) times daily.   predniSONE 20 MG tablet Commonly known as: DELTASONE Take 2 tablets (40 mg total) by mouth daily with breakfast for 3 days. Start on 01/05/2024   PreserVision AREDS Tabs Take 1 tablet by mouth 2 (two) times daily.   Rhopressa 0.02 % Soln Generic drug: Netarsudil Dimesylate Place 1 drop into the right eye at bedtime.   SYSTANE COMPLETE PF OP Apply 1 drop to eye 2 (two) times daily.   timolol 0.5 % ophthalmic solution Commonly known as: TIMOPTIC Place 1 drop into the right eye 2 (two) times daily.       Allergies  Allergen Reactions   Apraclonidine Rash   Lifitegrast Rash and Swelling    Eye swelling   Cheese Other (See Comments)    Sinus congestion   Chocolate Flavoring Agent (Non-Screening) Other (See Comments)    Sinus congestion   Milk (Cow) Other (See Comments)    Sinus congestion    Brimonidine Rash   Clarithromycin Rash   Clindamycin/Lincomycin Swelling and Rash   Levofloxacin Rash   Nickel Rash   Penicillins Rash   Polytrim [Polymyxin B-Trimethoprim] Rash   Statins Rash   Sulfamethoxazole Rash   Sulfonamide Derivatives Rash   Tobramycin Rash    Follow-up Information     Barbette Reichmann, MD Follow up.   Specialty: Internal Medicine Contact information: [address] [place] [address] [phone]                  The results of significant diagnostics from this  hospitalization (including imaging, microbiology, ancillary and laboratory) are listed below for reference.    Significant Diagnostic Studies: DG Chest 2 View Result Date: 01/03/2024 CLINICAL DATA:  Cough.  Weakness and falls. EXAM: CHEST - 2 VIEW COMPARISON:  10/22/2021 FINDINGS: Normal heart size. No pleural fluid, interstitial edema, or airspace consolidation. The visualized osseous structures are unremarkable. IMPRESSION: No acute cardiopulmonary disease. Electronically Signed   By: Signa Kell M.D.   On: 01/03/2024 09:47    Microbiology: Recent Results (from the past 240 hours)  Resp panel by RT-PCR (RSV, Flu A&B, Covid) Anterior Nasal Swab     Status: Abnormal   Collection Time: 01/03/24  8:50 AM   Specimen: Anterior Nasal Swab  Result Value Ref Range Status   SARS Coronavirus 2 by RT PCR NEGATIVE NEGATIVE Final    Comment: (NOTE) SARS-CoV-2 target nucleic acids are NOT DETECTED.  The SARS-CoV-2 RNA is generally detectable in upper respiratory specimens during the acute phase of infection. The lowest concentration of SARS-CoV-2 viral copies this assay can detect is 138 copies/mL. A negative result does not preclude SARS-Cov-2 infection and should not be used as the sole basis for treatment or other patient management decisions. A negative result may occur with  improper specimen collection/handling, submission of specimen other than nasopharyngeal swab, presence of viral mutation(s) within the areas targeted by this assay, and inadequate number of viral copies(<138 copies/mL). A negative result must be combined with clinical observations, patient history, and epidemiological information. The expected result is Negative.  Fact Sheet for Patients:  BloggerCourse.com  Fact Sheet for Healthcare Providers:  SeriousBroker.it  This test is no t yet approved or cleared by the [place] FDA and  has been authorized for  detection and/or diagnosis of SARS-CoV-2 by FDA under an Emergency Use Authorization (EUA). This EUA will remain  in effect (meaning this test can be used) for the duration of the COVID-19 declaration under Section 564(b)(1) of the Act, 21 U.S.C.section 360bbb-3(b)(1), unless the authorization is terminated  or revoked sooner.       Influenza A by PCR POSITIVE (A) NEGATIVE Final   Influenza B by PCR NEGATIVE NEGATIVE Final    Comment: (NOTE) The Xpert Xpress SARS-CoV-2/FLU/RSV plus assay is intended as an aid in the diagnosis of influenza from Nasopharyngeal swab specimens and should not be used as a sole basis for treatment. Nasal washings and aspirates are unacceptable for Xpert Xpress SARS-CoV-2/FLU/RSV testing.  Fact Sheet for Patients: BloggerCourse.com  Fact Sheet for Healthcare Providers: SeriousBroker.it  This test is not yet approved or cleared by the [place] FDA and has been authorized for detection and/or diagnosis of SARS-CoV-2 by FDA under an Emergency Use Authorization (EUA). This EUA will remain in effect (meaning this test can be used) for the duration of the COVID-19 declaration under Section 564(b)(1) of the Act, 21 U.S.C. section 360bbb-3(b)(1), unless the authorization is terminated or revoked.     Resp Syncytial Virus by PCR NEGATIVE NEGATIVE Final    Comment: (NOTE) Fact Sheet for Patients: BloggerCourse.com  Fact Sheet for Healthcare Providers: SeriousBroker.it  This test is not yet approved or cleared by the [place] FDA and has been authorized for detection and/or diagnosis of SARS-CoV-2 by FDA under an Emergency Use Authorization (EUA). This EUA will remain in effect (meaning this test can be used) for the duration of the COVID-19 declaration under Section 564(b)(1) of the Act, 21 U.S.C. section 360bbb-3(b)(1), unless the authorization is  terminated or revoked.  Performed at [facility], [address] [place], [place] [address]      Labs: Basic Metabolic Panel: Recent Labs  Lab 01/03/24 0739 01/04/24 0621  NA 135 141  K 3.8 3.7  CL 99 110  CO2 23 25  GLUCOSE 120* 122*  BUN 16 10  CREATININE 0.98 0.52  CALCIUM 9.2 9.2   Liver Function Tests: No results for input(s): "AST", "ALT", "ALKPHOS", "BILITOT", "PROT", "ALBUMIN" in the last 168 hours. No results for input(s): "LIPASE", "AMYLASE" in the last 168 hours. No results for input(s): "AMMONIA" in the last 168 hours. CBC: Recent Labs  Lab 01/03/24 0739 01/04/24 0621  WBC 9.3 4.4  HGB 13.0 11.7*  HCT 39.4 35.5*  MCV 83.7 83.7  PLT 211 189   Cardiac Enzymes: No results for input(s): "CKTOTAL", "CKMB", "CKMBINDEX", "TROPONINI" in the last 168 hours. BNP: BNP (last 3 results) No  results for input(s): "BNP" in the last 8760 hours.  ProBNP (last 3 results) No results for input(s): "PROBNP" in the last 8760 hours.  CBG: No results for input(s): "GLUCAP" in the last 168 hours.     Signed:  Silvano Bilis MD.  [facility] 01/04/2024, 10:01 AM

## 2024-01-04 NOTE — Evaluation (Signed)
 Physical Therapy Evaluation Patient Details Name: KAIANA MARION MRN: [id number] DOB: 01-07-1946 Today's Date: 01/04/2024  History of Present Illness  Sonya Gaines is a 78 y.o. female with history of hypertension, hyperlipidemia, COPD, OSA, GERD, and osteoporosis who presents with generalized weakness, falls, and cough. Flu +  Clinical Impression  Patient received in bed, she reports she is feeling a bit better. Still weak. She is mod I with bed mobility. Transfers with supervision. Ambulated around nursing unit pushing IV pole and walked in room without UE support. Patient is steady, but weak and cadence decreased. She will continue to benefit from skilled PT at home to improve strength and endurance.          If plan is discharge home, recommend the following: A little help with walking and/or transfers;A little help with bathing/dressing/bathroom;Help with stairs or ramp for entrance   Can travel by private vehicle    yes    Equipment Recommendations None recommended by PT  Recommendations for Other Services       Functional Status Assessment Patient has had a recent decline in their functional status and demonstrates the ability to make significant improvements in function in a reasonable and predictable amount of time.     Precautions / Restrictions Precautions Precautions: Fall Restrictions Weight Bearing Restrictions Per Provider Order: No      Mobility  Bed Mobility Overal bed mobility: Modified Independent                  Transfers Overall transfer level: Independent                      Ambulation/Gait Ambulation/Gait assistance: Supervision Gait Distance (Feet): 160 Feet Assistive device: IV Pole, None Gait Pattern/deviations: Step-through pattern, Decreased step length - right, Decreased step length - left Gait velocity: decr     General Gait Details: patient is weak with ambulation, but no lob or significant difficulties  Stairs             Wheelchair Mobility     Tilt Bed    Modified Rankin (Stroke Patients Only)       Balance Overall balance assessment: Modified Independent                                           Pertinent Vitals/Pain Pain Assessment Pain Assessment: No/denies pain    Home Living Family/patient expects to be discharged to:: Private residence Living Arrangements: Alone Available Help at Discharge: Family;Available PRN/intermittently Type of Home: House Home Access: Stairs to enter Entrance Stairs-Rails: Doctor, general practice of Steps: 2   Home Layout: One level Home Equipment: Agricultural consultant (2 wheels)      Prior Function Prior Level of Function : Independent/Modified Independent;Driving                     Extremity/Trunk Assessment   Upper Extremity Assessment Upper Extremity Assessment: Overall WFL for tasks assessed    Lower Extremity Assessment Lower Extremity Assessment: Generalized weakness    Cervical / Trunk Assessment Cervical / Trunk Assessment: Normal  Communication   Communication Communication: No apparent difficulties    Cognition Arousal: Alert Behavior During Therapy: WFL for tasks assessed/performed   PT - Cognitive impairments: No apparent impairments  Following commands: Intact       Cueing Cueing Techniques: Verbal cues     General Comments      Exercises     Assessment/Plan    PT Assessment All further PT needs can be met in the next venue of care  PT Problem List Decreased strength;Decreased activity tolerance;Decreased mobility       PT Treatment Interventions      PT Goals (Current goals can be found in the Care Plan section)  Acute Rehab PT Goals Patient Stated Goal: return home, get stronger PT Goal Formulation: With patient Time For Goal Achievement: 01/06/24 Potential to Achieve Goals: Good    Frequency       Co-evaluation                AM-PAC PT "6 Clicks" Mobility  Outcome Measure Help needed turning from your back to your side while in a flat bed without using bedrails?: None Help needed moving from lying on your back to sitting on the side of a flat bed without using bedrails?: None Help needed moving to and from a bed to a chair (including a wheelchair)?: A Little Help needed standing up from a chair using your arms (e.g., wheelchair or bedside chair)?: None Help needed to walk in hospital room?: A Little Help needed climbing 3-5 steps with a railing? : A Little 6 Click Score: 21    End of Session   Activity Tolerance: Patient tolerated treatment well Patient left: in bed;with call bell/phone within reach Nurse Communication: Mobility status PT Visit Diagnosis: Muscle weakness (generalized) (M62.81)    Time: 2841-3244 PT Time Calculation (min) (ACUTE ONLY): 11 min   Charges:   PT Evaluation $PT Eval Low Complexity: 1 Low   PT General Charges $$ ACUTE PT VISIT: 1 Visit         Sharod Petsch, PT, GCS 01/04/24,9:46 AM

## 2024-01-04 NOTE — Care Management Obs Status (Signed)
 MEDICARE OBSERVATION STATUS NOTIFICATION   Patient Details  Name: Sonya Gaines MRN: [id number] Date of Birth: 1946-02-26   Medicare Observation Status Notification Given:  Yes    Heidy Mccubbin E Reena Borromeo, LCSW 01/04/2024, 11:11 AM

## 2024-01-04 NOTE — TOC Initial Note (Signed)
 Transition of Care [facility]) - Initial/Assessment Note    Patient Details  Name: Sonya Gaines MRN: [id number] Date of Birth: [birth year]  [facility] CM/SW Contact:    Liliana Cline, LCSW Phone Number: 01/04/2024, 11:12 AM  Clinical Narrative:                 CSW spoke with patient regarding home health recommendations. Patient is from home alone and drives at baseline. PCP is Dr. Marcello Fennel. [facility]. Patient's son in law will transport her home today since she is discharged. Patient has a walker. Patient is aware of HH recs, however does not feel she needs HH. Patient is aware she will need to reach out to her PCP if she changes her mind post discharge about HH.  Expected Discharge Plan: Home/Self Care Barriers to Discharge: Barriers Resolved   Patient Goals and CMS Choice Patient states their goals for this hospitalization and ongoing recovery are:: declines [facility] CMS Medicare.gov Compare Post Acute Care list provided to:: Patient Choice offered to / list presented to : Patient      Expected Discharge Plan and Services       Living arrangements for the past 2 months: Single Family Home Expected Discharge Date: 01/04/24                                    Prior Living Arrangements/Services Living arrangements for the past 2 months: Single Family Home Lives with:: Self Patient language and need for interpreter reviewed:: Yes Do you feel safe going back to the place where you live?: Yes      Need for Family Participation in Patient Care: Yes (Comment) Care giver support system in place?: Yes (comment) Current home services: DME Criminal Activity/Legal Involvement Pertinent to Current Situation/Hospitalization: No - Comment as needed  Activities of Daily Living   ADL Screening (condition at time of admission) Independently performs ADLs?: Yes (appropriate for developmental age) Is the patient deaf or have difficulty hearing?: No Does the  patient have difficulty seeing, even when wearing glasses/contacts?: No Does the patient have difficulty concentrating, remembering, or making decisions?: No  Permission Sought/Granted                  Emotional Assessment       Orientation: : Oriented to Self, Oriented to Place, Oriented to  Time, Oriented to Situation Alcohol / Substance Use: Not Applicable Psych Involvement: No (comment)  Admission diagnosis:  Influenza A [J10.1] Falls [R29.6] Patient Active Problem List   Diagnosis Date Noted   COPD (chronic obstructive pulmonary disease) (HCC) 01/03/2024   Influenza A 01/03/2024   Weakness 01/03/2024   Pneumonia 10/22/2021   RSV (respiratory syncytial virus infection) 09/18/2021   Multifocal pneumonia 09/16/2021   COPD with acute exacerbation (HCC) 09/16/2021   At risk for allergic reaction to medication 09/16/2021   Glaucoma    Major depressive disorder, recurrent, in remission (HCC) 06/23/2020   GAD (generalized anxiety disorder) 02/13/2018   Closed right hip fracture (HCC) 03/10/2017   Cough variant asthma 01/25/2015   Essential (primary) hypertension 10/28/2013   GERD (gastroesophageal reflux disease) 04/27/2009   DYSPNEA 03/20/2009   Essential hypertension 01/26/2009   COUGH, CHRONIC 01/26/2009   PCP:  Barbette Reichmann, MD [phone] [place], [place] - [address] [place] [address] Phone: [phone] Fax: [phone]  Social Drivers of Health (SDOH) Social History: SDOH Screenings   Food Insecurity: No Food Insecurity (01/03/2024)  Housing: Low Risk  (01/03/2024)  Transportation Needs: No Transportation Needs (01/03/2024)  Utilities: Not At Risk (01/03/2024)  Financial Resource Strain: Medium Risk (10/10/2023)   Received from [facility]  Social Connections: Moderately Isolated (01/03/2024)  Tobacco Use: Medium Risk (01/03/2024)   SDOH Interventions:     Readmission Risk  Interventions     No data to display

## 2024-01-06 ENCOUNTER — Inpatient Hospital Stay
Admission: EM | Admit: 2024-01-06 | Discharge: 2024-01-08 | DRG: 194 | Disposition: A | Discharge status: Home / Self Care | Payer: Medicare PPO | Attending: Student | Admitting: Student

## 2024-01-06 ENCOUNTER — Emergency Department: Payer: Medicare PPO

## 2024-01-06 ENCOUNTER — Other Ambulatory Visit: Payer: Self-pay

## 2024-01-06 DIAGNOSIS — Z881 Allergy status to other antibiotic agents status: Secondary | ICD-10-CM

## 2024-01-06 DIAGNOSIS — Z801 Family history of malignant neoplasm of trachea, bronchus and lung: Secondary | ICD-10-CM

## 2024-01-06 DIAGNOSIS — I10 Essential (primary) hypertension: Secondary | ICD-10-CM | POA: Diagnosis present

## 2024-01-06 DIAGNOSIS — G4733 Obstructive sleep apnea (adult) (pediatric): Secondary | ICD-10-CM | POA: Diagnosis present

## 2024-01-06 DIAGNOSIS — Z87891 Personal history of nicotine dependence: Secondary | ICD-10-CM

## 2024-01-06 DIAGNOSIS — E785 Hyperlipidemia, unspecified: Secondary | ICD-10-CM | POA: Diagnosis present

## 2024-01-06 DIAGNOSIS — F411 Generalized anxiety disorder: Secondary | ICD-10-CM | POA: Diagnosis present

## 2024-01-06 DIAGNOSIS — K219 Gastro-esophageal reflux disease without esophagitis: Secondary | ICD-10-CM | POA: Diagnosis present

## 2024-01-06 DIAGNOSIS — Z91011 Allergy to milk products: Secondary | ICD-10-CM

## 2024-01-06 DIAGNOSIS — J45901 Unspecified asthma with (acute) exacerbation: Secondary | ICD-10-CM | POA: Diagnosis present

## 2024-01-06 DIAGNOSIS — J441 Chronic obstructive pulmonary disease with (acute) exacerbation: Secondary | ICD-10-CM | POA: Diagnosis present

## 2024-01-06 DIAGNOSIS — Z90710 Acquired absence of both cervix and uterus: Secondary | ICD-10-CM

## 2024-01-06 DIAGNOSIS — J101 Influenza due to other identified influenza virus with other respiratory manifestations: Secondary | ICD-10-CM

## 2024-01-06 DIAGNOSIS — Z8249 Family history of ischemic heart disease and other diseases of the circulatory system: Secondary | ICD-10-CM | POA: Diagnosis not present

## 2024-01-06 DIAGNOSIS — H409 Unspecified glaucoma: Secondary | ICD-10-CM | POA: Diagnosis present

## 2024-01-06 DIAGNOSIS — J440 Chronic obstructive pulmonary disease with acute lower respiratory infection: Secondary | ICD-10-CM | POA: Diagnosis present

## 2024-01-06 DIAGNOSIS — Z7951 Long term (current) use of inhaled steroids: Secondary | ICD-10-CM | POA: Diagnosis not present

## 2024-01-06 DIAGNOSIS — Z91018 Allergy to other foods: Secondary | ICD-10-CM

## 2024-01-06 DIAGNOSIS — F32A Depression, unspecified: Secondary | ICD-10-CM | POA: Diagnosis present

## 2024-01-06 DIAGNOSIS — Z880 Allergy status to penicillin: Secondary | ICD-10-CM | POA: Diagnosis not present

## 2024-01-06 DIAGNOSIS — Z825 Family history of asthma and other chronic lower respiratory diseases: Secondary | ICD-10-CM

## 2024-01-06 DIAGNOSIS — R531 Weakness: Secondary | ICD-10-CM | POA: Diagnosis present

## 2024-01-06 DIAGNOSIS — Z1152 Encounter for screening for COVID-19: Secondary | ICD-10-CM | POA: Diagnosis not present

## 2024-01-06 DIAGNOSIS — Y95 Nosocomial condition: Secondary | ICD-10-CM | POA: Diagnosis present

## 2024-01-06 DIAGNOSIS — E872 Acidosis, unspecified: Secondary | ICD-10-CM | POA: Diagnosis present

## 2024-01-06 DIAGNOSIS — J189 Pneumonia, unspecified organism: Secondary | ICD-10-CM

## 2024-01-06 DIAGNOSIS — Z803 Family history of malignant neoplasm of breast: Secondary | ICD-10-CM | POA: Diagnosis not present

## 2024-01-06 DIAGNOSIS — Z79899 Other long term (current) drug therapy: Secondary | ICD-10-CM

## 2024-01-06 DIAGNOSIS — Z882 Allergy status to sulfonamides status: Secondary | ICD-10-CM

## 2024-01-06 DIAGNOSIS — R5381 Other malaise: Secondary | ICD-10-CM | POA: Diagnosis present

## 2024-01-06 DIAGNOSIS — E876 Hypokalemia: Secondary | ICD-10-CM | POA: Diagnosis present

## 2024-01-06 DIAGNOSIS — Z888 Allergy status to other drugs, medicaments and biological substances status: Secondary | ICD-10-CM

## 2024-01-06 DIAGNOSIS — J1000 Influenza due to other identified influenza virus with unspecified type of pneumonia: Secondary | ICD-10-CM | POA: Diagnosis present

## 2024-01-06 DIAGNOSIS — E8809 Other disorders of plasma-protein metabolism, not elsewhere classified: Secondary | ICD-10-CM | POA: Diagnosis present

## 2024-01-06 DIAGNOSIS — I959 Hypotension, unspecified: Secondary | ICD-10-CM | POA: Diagnosis present

## 2024-01-06 DIAGNOSIS — E639 Nutritional deficiency, unspecified: Secondary | ICD-10-CM | POA: Diagnosis present

## 2024-01-06 LAB — CBC
HCT: 39.2 % (ref 36.0–46.0)
Hemoglobin: 13.2 g/dL (ref 12.0–15.0)
MCH: 27.9 pg (ref 26.0–34.0)
MCHC: 33.7 g/dL (ref 30.0–36.0)
MCV: 82.9 fL (ref 80.0–100.0)
Platelets: 243 10*3/uL (ref 150–400)
RBC: 4.73 MIL/uL (ref 3.87–5.11)
RDW: 13.2 % (ref 11.5–15.5)
WBC: 17.4 10*3/uL — ABNORMAL HIGH (ref 4.0–10.5)
nRBC: 0 % (ref 0.0–0.2)

## 2024-01-06 LAB — LACTIC ACID, PLASMA
Lactic Acid, Venous: 1.5 mmol/L (ref 0.5–1.9)
Lactic Acid, Venous: 1.1 mmol/L (ref 0.5–1.9)

## 2024-01-06 LAB — PHOSPHORUS: Phosphorus: 1.8 mg/dL — ABNORMAL LOW (ref 2.5–4.6)

## 2024-01-06 LAB — COMPREHENSIVE METABOLIC PANEL
ALT: 27 U/L (ref 0–44)
AST: 43 U/L — ABNORMAL HIGH (ref 15–41)
Albumin: 3.4 g/dL — ABNORMAL LOW (ref 3.5–5.0)
Alkaline Phosphatase: 66 U/L (ref 38–126)
Anion gap: 13 (ref 5–15)
BUN: 16 mg/dL (ref 8–23)
CO2: 23 mmol/L (ref 22–32)
Calcium: 9.3 mg/dL (ref 8.9–10.3)
Chloride: 98 mmol/L (ref 98–111)
Creatinine, Ser: 1.1 mg/dL — ABNORMAL HIGH (ref 0.44–1.00)
GFR, Estimated: 52 mL/min — ABNORMAL LOW (ref >60)
Glucose, Bld: 115 mg/dL — ABNORMAL HIGH (ref 70–99)
Potassium: 3.2 mmol/L — ABNORMAL LOW (ref 3.5–5.1)
Sodium: 134 mmol/L — ABNORMAL LOW (ref 135–145)
Total Bilirubin: 0.5 mg/dL (ref 0.0–1.2)
Total Protein: 6.9 g/dL (ref 6.5–8.1)

## 2024-01-06 LAB — MAGNESIUM: Magnesium: 2 mg/dL (ref 1.7–2.4)

## 2024-01-06 LAB — VITAMIN D 25 HYDROXY (VIT D DEFICIENCY, FRACTURES): Vit D, 25-Hydroxy: 41.11 ng/mL (ref 30–100)

## 2024-01-06 LAB — PROCALCITONIN: Procalcitonin: 2.56 ng/mL

## 2024-01-06 MED ORDER — GUAIFENESIN ER 600 MG PO TB12
600.0000 mg | ORAL_TABLET | Freq: Two times a day (BID) | ORAL | Status: DC
  Administered 2024-01-06 – 2024-01-08 (×5): 600 mg via ORAL
  Filled 2024-01-06 (×5): qty 1

## 2024-01-06 MED ORDER — FLUTICASONE FUROATE-VILANTEROL 200-25 MCG/ACT IN AEPB
1.0000 | INHALATION_SPRAY | Freq: Every day | RESPIRATORY_TRACT | Status: DC
  Administered 2024-01-06 – 2024-01-08 (×3): 1 via RESPIRATORY_TRACT
  Filled 2024-01-06: qty 28

## 2024-01-06 MED ORDER — ONDANSETRON HCL 4 MG PO TABS: 4.0000 mg | ORAL_TABLET | Freq: Four times a day (QID) | ORAL | Status: DC | PRN

## 2024-01-06 MED ORDER — IPRATROPIUM-ALBUTEROL 0.5-2.5 (3) MG/3ML IN SOLN: 3.0000 mL | Freq: Four times a day (QID) | RESPIRATORY_TRACT | Status: DC | PRN

## 2024-01-06 MED ORDER — DEXTROSE 5 % IV SOLN
30.0000 mmol | Freq: Once | INTRAVENOUS | Status: AC
  Administered 2024-01-06: 30 mmol via INTRAVENOUS
  Filled 2024-01-06: qty 10

## 2024-01-06 MED ORDER — SODIUM CHLORIDE 0.9 % IV SOLN
2.0000 g | Freq: Once | INTRAVENOUS | Status: AC
  Administered 2024-01-06: 2 g via INTRAVENOUS
  Filled 2024-01-06: qty 12.5

## 2024-01-06 MED ORDER — ACETAMINOPHEN 650 MG RE SUPP: 650.0000 mg | Freq: Four times a day (QID) | RECTAL | Status: DC | PRN

## 2024-01-06 MED ORDER — SODIUM CHLORIDE 0.9 % IV SOLN: 250.0000 mL | INTRAVENOUS | Status: AC | PRN

## 2024-01-06 MED ORDER — VANCOMYCIN HCL IN DEXTROSE 1-5 GM/200ML-% IV SOLN
1000.0000 mg | Freq: Once | INTRAVENOUS | Status: AC
  Administered 2024-01-06: 1000 mg via INTRAVENOUS
  Filled 2024-01-06: qty 200

## 2024-01-06 MED ORDER — ONDANSETRON HCL 4 MG/2ML IJ SOLN: 4.0000 mg | Freq: Four times a day (QID) | INTRAMUSCULAR | Status: DC | PRN

## 2024-01-06 MED ORDER — PANTOPRAZOLE SODIUM 40 MG PO TBEC
40.0000 mg | DELAYED_RELEASE_TABLET | Freq: Every day | ORAL | Status: DC
  Administered 2024-01-06 – 2024-01-08 (×3): 40 mg via ORAL
  Filled 2024-01-06 (×3): qty 1

## 2024-01-06 MED ORDER — SODIUM CHLORIDE 0.9% FLUSH
3.0000 mL | Freq: Two times a day (BID) | INTRAVENOUS | Status: DC
  Administered 2024-01-06 – 2024-01-08 (×5): 3 mL via INTRAVENOUS

## 2024-01-06 MED ORDER — SODIUM CHLORIDE 0.9% FLUSH
3.0000 mL | INTRAVENOUS | Status: DC | PRN
  Administered 2024-01-06: 3 mL via INTRAVENOUS

## 2024-01-06 MED ORDER — SODIUM CHLORIDE 0.9 % IV BOLUS
1000.0000 mL | Freq: Once | INTRAVENOUS | Status: AC
  Administered 2024-01-06: 1000 mL via INTRAVENOUS

## 2024-01-06 MED ORDER — SODIUM CHLORIDE 0.9 % IV BOLUS (SEPSIS)
1000.0000 mL | Freq: Once | INTRAVENOUS | Status: AC
  Administered 2024-01-06: 1000 mL via INTRAVENOUS

## 2024-01-06 MED ORDER — OSELTAMIVIR PHOSPHATE 30 MG PO CAPS
30.0000 mg | ORAL_CAPSULE | Freq: Two times a day (BID) | ORAL | Status: AC
  Administered 2024-01-06 – 2024-01-07 (×3): 30 mg via ORAL
  Filled 2024-01-06 (×3): qty 1

## 2024-01-06 MED ORDER — VANCOMYCIN HCL 500 MG/100ML IV SOLN
500.0000 mg | INTRAVENOUS | Status: DC
  Filled 2024-01-06: qty 100

## 2024-01-06 MED ORDER — ACETAMINOPHEN 325 MG PO TABS: 650.0000 mg | ORAL_TABLET | Freq: Four times a day (QID) | ORAL | Status: DC | PRN

## 2024-01-06 MED ORDER — SODIUM CHLORIDE 0.9 % IV SOLN
2.0000 g | Freq: Two times a day (BID) | INTRAVENOUS | Status: DC
  Administered 2024-01-06 – 2024-01-08 (×4): 2 g via INTRAVENOUS
  Filled 2024-01-06 (×4): qty 12.5

## 2024-01-06 MED ORDER — SODIUM CHLORIDE 0.9% FLUSH
3.0000 mL | Freq: Two times a day (BID) | INTRAVENOUS | Status: DC
  Administered 2024-01-06 – 2024-01-07 (×4): 3 mL via INTRAVENOUS

## 2024-01-06 MED ORDER — POTASSIUM CHLORIDE CRYS ER 20 MEQ PO TBCR
40.0000 meq | EXTENDED_RELEASE_TABLET | Freq: Once | ORAL | Status: AC
  Administered 2024-01-06: 40 meq via ORAL
  Filled 2024-01-06: qty 2

## 2024-01-06 MED ORDER — HYDROCOD POLI-CHLORPHE POLI ER 10-8 MG/5ML PO SUER
5.0000 mL | Freq: Two times a day (BID) | ORAL | Status: DC | PRN
  Administered 2024-01-08: 5 mL via ORAL
  Filled 2024-01-06: qty 5

## 2024-01-06 MED ORDER — ENOXAPARIN SODIUM 40 MG/0.4ML IJ SOSY
40.0000 mg | PREFILLED_SYRINGE | INTRAMUSCULAR | Status: DC
  Administered 2024-01-06: 40 mg via SUBCUTANEOUS
  Filled 2024-01-06 (×2): qty 0.4

## 2024-01-06 NOTE — ED Triage Notes (Signed)
 First nurse note: pt to ED with daughter, reports recently d/c after dx with flu. States still has flu and PCP wanted repeat chest xray and urine sample.

## 2024-01-06 NOTE — H&P (Addendum)
 [facility] History and Physical   Patient: Sonya Gaines NWG:[id number]   PCP: Barbette Reichmann, MD DOB: Mar 17, 1946   DOA: 01/06/2024   DOS: 01/06/2024   DOS: the patient was seen and examined on 01/06/2024  Patient coming from: The patient is coming from Home  Chief Complaint: SOB, cough, fever and feeling weak  HPI: Sonya Gaines is a 78 y.o. female with Past medical history of HTN, COPD, generalized anxiety disorder, depression, and flu few days ago, she was admitted with shortness of breath and syncopal episode, patient improved and she was discharged on 2/16 on steroids and Tamiflu.  Patient stayed well after discharge, suddenly yesterday she had worsening of cough and phlegm production.  Also had fever 104 so she was brought back to the ED.  Fever 104 at home  ED Course: Afebrile temp 98.2, pulse 88, RR 18, BP 91/57 BMP Na 134, K3.2 hypokalemia, creatinine 1.1 slightly elevated Lactic acid 1.5 within normal range Procalcitonin 2.56 elevated Leukocytosis, WBC 17.4 elevated CXR: left hilar and lingular opacity   Review of Systems: as mentioned in the history of present illness.  All other systems reviewed and are negative.  Past Medical History:  Diagnosis Date   Asthma    Slight   COPD (chronic obstructive pulmonary disease) (HCC)    GERD (gastroesophageal reflux disease)    Glaucoma    Heart murmur    Hyperlipidemia    Hypertension    Macular degeneration    OSA (obstructive sleep apnea)    not on CPAP    Osteoporosis    Past Surgical History:  Procedure Laterality Date   ABDOMINAL HYSTERECTOMY     BIOPSY  12/23/2023   Procedure: BIOPSY;  Surgeon: Toledo, Boykin Nearing, MD;  Location: [facility] ENDOSCOPY;  Service: Gastroenterology;;   BREAST CYST ASPIRATION Right YRS AGO   COLONOSCOPY WITH PROPOFOL N/A 07/14/2019   Procedure: COLONOSCOPY WITH PROPOFOL;  Surgeon: Toledo, Boykin Nearing, MD;  Location: [facility] ENDOSCOPY;  Service: Endoscopy;  Laterality: N/A;    ESOPHAGOGASTRODUODENOSCOPY (EGD) WITH PROPOFOL N/A 12/23/2023   Procedure: ESOPHAGOGASTRODUODENOSCOPY (EGD) WITH PROPOFOL;  Surgeon: Toledo, Boykin Nearing, MD;  Location: [facility] ENDOSCOPY;  Service: Gastroenterology;  Laterality: N/A;   GLAUCOMA SURGERY     x 2    HIP PINNING,CANNULATED Right 03/12/2017   Procedure: CANNULATED HIP PINNING;  Surgeon: Juanell Fairly, MD;  Location: [facility] ORS;  Service: Orthopedics;  Laterality: Right;   NASAL SINUS SURGERY     x 3    VESICOVAGINAL FISTULA CLOSURE W/ TAH     Social History:  reports that she quit smoking about 32 years ago. Her smoking use included cigarettes. She started smoking about 62 years ago. She has a 45 pack-year smoking history. She has never used smokeless tobacco. She reports that she does not drink alcohol and does not use drugs.  Allergies  Allergen Reactions   Apraclonidine Rash   Lifitegrast Rash and Swelling    Eye swelling   Cheese Other (See Comments)    Sinus congestion   Chocolate Flavoring Agent (Non-Screening) Other (See Comments)    Sinus congestion   Milk (Cow) Other (See Comments)    Sinus congestion    Brimonidine Rash   Clarithromycin Rash   Clindamycin/Lincomycin Swelling and Rash   Levofloxacin Rash   Nickel Rash   Penicillins Rash   Polytrim [Polymyxin B-Trimethoprim] Rash   Statins Rash   Sulfamethoxazole Rash   Sulfonamide Derivatives Rash   Tobramycin Rash     Family history  reviewed and not pertinent Family History  Problem Relation Age of Onset   Emphysema Father        smoked   Heart disease Father    Heart disease Brother    Breast cancer Maternal Aunt    Lung cancer Paternal Grandfather        never smoker   Lung cancer Son        never smoker     Prior to Admission medications   Medication Sig Start Date End Date Taking? Authorizing Provider  albuterol (VENTOLIN HFA) 108 (90 Base) MCG/ACT inhaler Inhale 2 puffs into the lungs every 6 (six) hours as needed for wheezing or shortness of  breath.   Yes [provider]  amLODipine (NORVASC) 5 MG tablet Take 5 mg by mouth daily.   Yes [provider]  budesonide-formoterol (SYMBICORT) 160-4.5 MCG/ACT inhaler Inhale 2 puffs into the lungs 2 (two) times daily.   Yes [provider]  calcium carbonate (OSCAL) 1500 (600 Ca) MG TABS tablet Take 1,500 mg by mouth daily.   Yes [provider]  esomeprazole (NEXIUM) 40 MG capsule Take 40 mg by mouth daily. 11/05/16  Yes [provider]  ezetimibe (ZETIA) 10 MG tablet Take 10 mg by mouth daily.   Yes [provider]  latanoprost (XALATAN) 0.005 % ophthalmic solution Place 1 drop into the right eye at bedtime.   Yes [provider]  losartan (COZAAR) 50 MG tablet Take 50 mg by mouth daily.   Yes [provider]  montelukast (SINGULAIR) 10 MG tablet Take 10 mg by mouth at bedtime.   Yes [provider]  Multiple Vitamins-Minerals (PRESERVISION AREDS) TABS Take 1 tablet by mouth 2 (two) times daily.    Yes [provider]  oseltamivir (TAMIFLU) 30 MG capsule Take 1 capsule (30 mg total) by mouth 2 (two) times daily. 01/04/24  Yes Wouk, Wilfred Curtis, MD  Propylene Glycol (SYSTANE COMPLETE PF OP) Apply 1 drop to eye 2 (two) times daily.   Yes [provider]  timolol (TIMOPTIC) 0.5 % ophthalmic solution Place 1 drop into the right eye 2 (two) times daily.   Yes [provider]  vitamin B-12 (CYANOCOBALAMIN) 1000 MCG tablet Take 1,000 mcg by mouth daily.   Yes [provider]  brinzolamide (AZOPT) 1 % ophthalmic suspension Place 1 drop into the right eye 2 (two) times daily. Patient not taking: Reported on 01/06/2024    [provider]  citalopram (CELEXA) 10 MG tablet Take 10 mg by mouth daily. Patient not taking: Reported on 01/03/2024 07/11/21   [provider]  dorzolamide-timolol (COSOPT) 22.3-6.8 MG/ML ophthalmic solution Place 1 drop into the right eye 2 (two)  times daily.  Patient not taking: Reported on 01/04/2024    [provider]  FERREX 150 150 MG capsule Take 150 mg by mouth every other day. Patient not taking: Reported on 01/03/2024 09/12/21   [provider]  guaiFENesin-dextromethorphan (ROBITUSSIN DM) 100-10 MG/5ML syrup Take 5 mLs by mouth every 6 (six) hours as needed for cough. Patient not taking: Reported on 01/03/2024 10/25/21   Lorin Glass, MD  predniSONE (DELTASONE) 20 MG tablet Take 2 tablets (40 mg total) by mouth daily with breakfast for 3 days. Start on 01/05/2024 Patient not taking: Reported on 01/06/2024 01/04/24 01/07/24  Wouk, Wilfred Curtis, MD  RHOPRESSA 0.02 % SOLN Place 1 drop into the right eye at bedtime. Patient not taking: Reported on 01/04/2024 10/08/21   [provider]  Physical Exam: Vitals:   01/06/24 1015 01/06/24 1022 01/06/24 1436  BP: (!) 91/57    Pulse: 88    Resp: 18    Temp: 98.2 F (36.8 C)  97.9 F (36.6 C)  TempSrc: Oral  Oral  SpO2:  95%   Weight:  55 kg   Height:  5' (1.524 m)     General: alert and oriented to time, place, and person. Appear in mild distress, affect appropriate Eyes: PERRLA, Conjunctiva normal ENT: Oral Mucosa Clear, moist  Neck: no JVD, no Abnormal Mass Or lumps Cardiovascular: S1 and S2 Present, no Murmur, peripheral pulses symmetrical Respiratory: good respiratory effort, Bilateral Air entry equal and Decreased, no signs of accessory muscle use, b/l Crackles, no wheezes Abdomen: Bowel Sound present, Soft and no tenderness, on hernia Skin: no rashes  Extremities: no Pedal edema, no calf tenderness Neurologic: without any new focal findings Gait not checked due to patient safety concerns  Data Reviewed: I have personally reviewed and interpreted labs, imaging as discussed below.  CBC: Recent Labs  Lab 01/03/24 0739 01/04/24 0621 01/06/24 1027  WBC 9.3 4.4 17.4*  HGB 13.0 11.7* 13.2  HCT 39.4 35.5* 39.2  MCV 83.7 83.7 82.9  PLT 211  189 243   Basic Metabolic Panel: Recent Labs  Lab 01/03/24 0739 01/04/24 0621 01/06/24 1027  NA 135 141 134*  K 3.8 3.7 3.2*  CL 99 110 98  CO2 23 25 23   GLUCOSE 120* 122* 115*  BUN 16 10 16   CREATININE 0.98 0.52 1.10*  CALCIUM 9.2 9.2 9.3   GFR: Estimated Creatinine Clearance: 33.3 mL/min (A) (by C-G formula based on SCr of 1.1 mg/dL (H)). Liver Function Tests: Recent Labs  Lab 01/06/24 1027  AST 43*  ALT 27  ALKPHOS 66  BILITOT 0.5  PROT 6.9  ALBUMIN 3.4*   No results for input(s): "LIPASE", "AMYLASE" in the last 168 hours. No results for input(s): "AMMONIA" in the last 168 hours. Coagulation Profile: No results for input(s): "INR", "PROTIME" in the last 168 hours. Cardiac Enzymes: No results for input(s): "CKTOTAL", "CKMB", "CKMBINDEX", "TROPONINI" in the last 168 hours. BNP (last 3 results) No results for input(s): "PROBNP" in the last 8760 hours. HbA1C: No results for input(s): "HGBA1C" in the last 72 hours. CBG: No results for input(s): "GLUCAP" in the last 168 hours. Lipid Profile: No results for input(s): "CHOL", "HDL", "LDLCALC", "TRIG", "CHOLHDL", "LDLDIRECT" in the last 72 hours. Thyroid Function Tests: No results for input(s): "TSH", "T4TOTAL", "FREET4", "T3FREE", "THYROIDAB" in the last 72 hours. Anemia Panel: No results for input(s): "VITAMINB12", "FOLATE", "FERRITIN", "TIBC", "IRON", "RETICCTPCT" in the last 72 hours. Urine analysis:    Component Value Date/Time   COLORURINE YELLOW (A) 10/22/2021 1500   APPEARANCEUR CLEAR (A) 10/22/2021 1500   LABSPEC 1.021 10/22/2021 1500   PHURINE 6.0 10/22/2021 1500   GLUCOSEU NEGATIVE 10/22/2021 1500   HGBUR NEGATIVE 10/22/2021 1500   BILIRUBINUR NEGATIVE 10/22/2021 1500   KETONESUR NEGATIVE 10/22/2021 1500   PROTEINUR NEGATIVE 10/22/2021 1500   NITRITE NEGATIVE 10/22/2021 1500   LEUKOCYTESUR NEGATIVE 10/22/2021 1500    Radiological Exams on Admission: DG Chest 2 View Result Date:  01/06/2024 CLINICAL DATA:  Cough. EXAM: CHEST - 2 VIEW COMPARISON:  Chest radiograph dated 01/03/2024. FINDINGS: Left perihilar and lingular opacity most consistent with pneumonia. Clinical correlation and follow-up to resolution recommended. The right lung is clear. There is no pleural effusion or pneumothorax. The cardiac silhouette is within normal limits. No acute osseous pathology. IMPRESSION:  Left perihilar and lingular pneumonia. Electronically Signed   By: Elgie Collard M.D.   On: 01/06/2024 13:18   EKG: Pending  I reviewed all nursing notes, pharmacy notes, vitals, pertinent old records.  Assessment/Plan Principal Problem:   HCAP (healthcare-associated pneumonia)   # HCAP (healthcare-associated pneumonia) Started broad-spectrum antibiotics vancomycin and cefepime Pharmacy consulted for dosing Started Mucinex 600 mg p.o. twice daily, Tussionex prn for cough Supplemental O2 inhalation as needed  # Influenza A infection on 2/15 PCR positive Patient was discharged on prednisone and Tamiflu Continue Tamiflu for 1 day more, to complete the 5-day course  No need of a steroid at this time   # Hypotension 2 L NS bolus given in the ED Monitor BP Held antihypertensive medications for now   # HTN Presented with low blood pressure Held home medications for now Monitor BP and titrate medications accordingly   # Hypokalemia, potassium repleted. # Hypophosphatemia due to nutritional deficiency.  Phos repleted. Monitor electrolytes and replete as needed.   Depression and GAD Continue supportive care Patient is not on any medication at this time   Nutrition: Regular diet DVT Prophylaxis: Subcutaneous Lovenox  Advance goals of care discussion: Full code   Consults: None  Family Communication: family was not present at bedside, at the time of interview.  Opportunity was given to ask question and all questions were answered satisfactorily.  Disposition: Admitted as  inpatient, med-surge unit. Likely to be discharged home, in 2-3 days when stable.  I have discussed plan of care as described above with RN and patient/family.  Severity of Illness: The appropriate patient status for this patient is INPATIENT. Inpatient status is judged to be reasonable and necessary in order to provide the required intensity of service to ensure the patient's safety. The patient's presenting symptoms, physical exam findings, and initial radiographic and laboratory data in the context of their chronic comorbidities is felt to place them at high risk for further clinical deterioration. Furthermore, it is not anticipated that the patient will be medically stable for discharge from the hospital within 2 midnights of admission.   * I certify that at the point of admission it is my clinical judgment that the patient will require inpatient hospital care spanning beyond 2 midnights from the point of admission due to high intensity of service, high risk for further deterioration and high frequency of surveillance required.*   Author: Gillis Santa, MD [facility] 01/06/2024 3:51 PM   To reach On-call, see care teams to locate the attending and reach out to them via [email or web address]. If 7PM-7AM, please contact night-coverage If you still have difficulty reaching the attending provider, please page the [facility] (Director on Call) for [facility] on amion for assistance.

## 2024-01-06 NOTE — Sepsis Progress Note (Signed)
 Sepsis protocol monitored by eLink ?

## 2024-01-06 NOTE — ED Provider Notes (Signed)
 [facility] Provider Note    Event Date/Time   First MD Initiated Contact with Patient 01/06/24 1237     (approximate)  History   Chief Complaint: Fever and Weakness  HPI  Sonya Gaines is a 78 y.o. female with a past medical history of asthma, COPD, hypertension, hyperlipidemia, presents to the emergency department for worsening weakness and shortness of breath.  According to the patient she was diagnosed with influenza approximately 4 days ago after being sick for 2 days or so.  Patient states she was admitted to the hospital due to weakness and a syncopal episode in addition to her influenza.  She was discharged on Sunday and she was feeling much better.  However overnight last night into today she has been feeling weak once again patient noted to have a temperature of 104 at home orally per patient.  Daughter came over and brought the patient to the emergency department for evaluation.  Here patient continues to state cough and shortness of breath.  Patient has been taking a course of prednisone but it has not seemed to help.  Physical Exam   Triage Vital Signs: ED Triage Vitals  Encounter Vitals Group     BP 01/06/24 1015 (!) 91/57     Systolic BP Percentile --      Diastolic BP Percentile --      Pulse Rate 01/06/24 1015 88     Resp 01/06/24 1015 18     Temp 01/06/24 1015 98.2 F (36.8 C)     Temp Source 01/06/24 1015 Oral     SpO2 01/06/24 1022 95 %     Weight 01/06/24 1022 121 lb 4.1 oz (55 kg)     Height 01/06/24 1022 5' (1.524 m)     Head Circumference --      Peak Flow --      Pain Score 01/06/24 1022 0     Pain Loc --      Pain Education --      Exclude from Growth Chart --     Most recent vital signs: Vitals:   01/06/24 1015 01/06/24 1022  BP: (!) 91/57   Pulse: 88   Resp: 18   Temp: 98.2 F (36.8 C)   SpO2:  95%    General: Awake, no distress.  CV:  Good peripheral perfusion.  Regular rate and rhythm around 100  bpm Resp:  Normal effort.  Equal breath sounds bilaterally.  Rhonchi auscultated bilaterally.  Occasional wet sounding cough during evaluation Abd:  No distention.  Soft, nontender.  No rebound or guarding.  ED Results / Procedures / Treatments   EKG  EKG viewed and interpreted by myself shows normal sinus rhythm 83 bpm with a narrow QRS, normal axis, normal intervals, no concerning ST changes.  Overall reassuring EKG.  RADIOLOGY  I have reviewed interpret the chest x-ray images.  Patient appears to have a left upper lobe pneumonia on my evaluation of the images.  This appears to be new since her last x-ray 3 days ago.   MEDICATIONS ORDERED IN ED: Medications  sodium chloride 0.9 % bolus 1,000 mL (has no administration in time range)  sodium chloride 0.9 % bolus 1,000 mL (0 mLs Intravenous Stopped 01/06/24 1139)     IMPRESSION / MDM / ASSESSMENT AND PLAN / ED COURSE  I reviewed the triage vital signs and the nursing notes.  Patient's presentation is most consistent with acute presentation with potential threat to life or  bodily function.  Patient presents to the emergency department for worsening weakness shortness of breath cough and fever at home.  Patient states she had been feeling better over the past 2 days until overnight and into this morning.  Patient has bilateral rhonchi on exam with occasional wet sounding cough.  Patient's labs show moderate leukocytosis of 17,000 however the patient is finishing a prednisone course.  Patient's chemistry shows no significant findings besides borderline dehydration with a anion gap of 13 and a slight creatinine elevation at 1.1.  We will IV hydrate the patient.  Patient's chest x-ray today appears to show a new left-sided pneumonia, a normal chest x-ray was performed 3 days ago.  Highly suspect the patient's new pneumonia to be the cause of her worsening symptoms and fever.  Given the leukocytosis is difficult to say whether this is more of a  viral base pneumonia versus bacterial secondary infection.  Given the patient's hypotension elevated white blood cell count and new chest x-ray findings with worsening shortness of breath and generalized weakness will admit the patient to the hospital service.  We will send blood cultures start the patient on IV antibiotics.  I have consulted pharmacy regarding antibiotics given the patient's multiple antibiotic allergies.  CRITICAL CARE Performed by: Minna Antis   Total critical care time: 30 minutes  Critical care time was exclusive of separately billable procedures and treating other patients.  Critical care was necessary to treat or prevent imminent or life-threatening deterioration.  Critical care was time spent personally by me on the following activities: development of treatment plan with patient and/or surrogate as well as nursing, discussions with consultants, evaluation of patient's response to treatment, examination of patient, obtaining history from patient or surrogate, ordering and performing treatments and interventions, ordering and review of laboratory studies, ordering and review of radiographic studies, pulse oximetry and re-evaluation of patient's condition.   FINAL CLINICAL IMPRESSION(S) / ED DIAGNOSES   Pneumonia Sepsis   Note:  This document was prepared using Dragon voice recognition software and may include unintentional dictation errors.   Minna Antis, MD 01/06/24 1340

## 2024-01-06 NOTE — ED Triage Notes (Signed)
 Pt here with weakness and a fever. Pt was recently dx with the flu and was sent here for a repeat chest xray and fluids. Pt has been weak for a while.

## 2024-01-06 NOTE — Consult Note (Signed)
 Pharmacy Antibiotic Note  Sonya Gaines is a 78 y.o. female admitted on 01/06/2024 with pneumonia.  Pharmacy has been consulted for cefepime and vancomycin dosing.  Plan: Start Cefepime 2 g q12H  Pt ordered vancomycin 1000 mg x 1 loading dose. Will order vancomycin 500 mg q24H . Predicted AUC of 421. Goal AUC of 400-600. Vd 0.72, Scr 1.1, IBW. Plan for vancomycin level after 4th or 5th dose. Ordered MRSA PCR. If negative recommend discontinuing vancomycin.   Height: 5' (152.4 cm) Weight: 55 kg (121 lb 4.1 oz) IBW/kg (Calculated) : 45.5  Temp (24hrs), Avg:98.1 F (36.7 C), Min:97.9 F (36.6 C), Max:98.2 F (36.8 C)  Recent Labs  Lab 01/03/24 0739 01/04/24 0621 01/06/24 1027 01/06/24 1323  WBC 9.3 4.4 17.4*  --   CREATININE 0.98 0.52 1.10*  --   LATICACIDVEN  --   --   --  1.5    Estimated Creatinine Clearance: 33.3 mL/min (A) (by C-G formula based on SCr of 1.1 mg/dL (H)).    Allergies  Allergen Reactions   Apraclonidine Rash   Lifitegrast Rash and Swelling    Eye swelling   Cheese Other (See Comments)    Sinus congestion   Chocolate Flavoring Agent (Non-Screening) Other (See Comments)    Sinus congestion   Milk (Cow) Other (See Comments)    Sinus congestion    Brimonidine Rash   Clarithromycin Rash   Clindamycin/Lincomycin Swelling and Rash   Levofloxacin Rash   Nickel Rash   Penicillins Rash   Polytrim [Polymyxin B-Trimethoprim] Rash   Statins Rash   Sulfamethoxazole Rash   Sulfonamide Derivatives Rash   Tobramycin Rash    Antimicrobials this admission: 2/18 cefepime >>  2/18 vancomycin >>   Dose adjustments this admission: None  Microbiology results: 2/18 BCx: pending   Thank you for allowing pharmacy to be a part of this patient's care.  Ronnald Ramp, PharmD, BCPS 01/06/2024 4:01 PM

## 2024-01-06 NOTE — ED Provider Triage Note (Signed)
 Emergency Medicine Provider Triage Evaluation Note  OLEVIA WESTERVELT , a 78 y.o. female  was evaluated in triage.  Pt complains of cough weakness and having the flu.  Review of Systems  Positive: Has developed a cough for the last couple days called her doctor they felt she should have a chest x-ray to make sure she is not developing "secondary pneumonia Negative:   Also positive for fatigue  Physical Exam  BP (!) 91/57 (BP Location: Right Arm)   Pulse 88   Temp 98.2 F (36.8 C) (Oral)   Resp 18   Ht 5' (1.524 m)   Wt 55 kg   SpO2 95%   BMI 23.68 kg/m  Gen:   Awake, no distress   Resp:  Normal effort  MSK:   Moves extremities without difficulty  Other:  Resting comfortably in chair.  Noted to be hypotensive.  Medical Decision Making  Medically screening exam initiated at 10:25 AM.  Appropriate orders placed.  KASSIAH MCCRORY was informed that the remainder of the evaluation will be completed by another provider, this initial triage assessment does not replace that evaluation, and the importance of remaining in the ED until their evaluation is complete.     Sharyn Creamer, MD 01/06/24 1025

## 2024-01-07 ENCOUNTER — Encounter: Payer: Self-pay | Admitting: Student

## 2024-01-07 DIAGNOSIS — J189 Pneumonia, unspecified organism: Secondary | ICD-10-CM | POA: Diagnosis not present

## 2024-01-07 LAB — CBC
HCT: 32.9 % — ABNORMAL LOW (ref 36.0–46.0)
Hemoglobin: 11.1 g/dL — ABNORMAL LOW (ref 12.0–15.0)
MCH: 27.5 pg (ref 26.0–34.0)
MCHC: 33.7 g/dL (ref 30.0–36.0)
MCV: 81.6 fL (ref 80.0–100.0)
Platelets: 220 10*3/uL (ref 150–400)
RBC: 4.03 MIL/uL (ref 3.87–5.11)
RDW: 13.4 % (ref 11.5–15.5)
WBC: 16.4 10*3/uL — ABNORMAL HIGH (ref 4.0–10.5)
nRBC: 0 % (ref 0.0–0.2)

## 2024-01-07 LAB — BASIC METABOLIC PANEL
Anion gap: 8 (ref 5–15)
BUN: 10 mg/dL (ref 8–23)
CO2: 22 mmol/L (ref 22–32)
Calcium: 8.6 mg/dL — ABNORMAL LOW (ref 8.9–10.3)
Chloride: 109 mmol/L (ref 98–111)
Creatinine, Ser: 0.58 mg/dL (ref 0.44–1.00)
GFR, Estimated: >60 mL/min (ref >60)
Glucose, Bld: 89 mg/dL (ref 70–99)
Potassium: 4.2 mmol/L (ref 3.5–5.1)
Sodium: 139 mmol/L (ref 135–145)

## 2024-01-07 LAB — PHOSPHORUS: Phosphorus: 3.7 mg/dL (ref 2.5–4.6)

## 2024-01-07 LAB — MAGNESIUM: Magnesium: 2.1 mg/dL (ref 1.7–2.4)

## 2024-01-07 LAB — MRSA NEXT GEN BY PCR, NASAL: MRSA by PCR Next Gen: NOT DETECTED

## 2024-01-07 NOTE — Progress Notes (Signed)
 [facility] Progress Note  Patient: Sonya Gaines    DUK:[id number]  DOA: 01/06/2024     Date of Service: the patient was seen and examined on 01/07/2024  Chief Complaint  Patient presents with   Fever   Weakness   Brief hospital course:  Sonya Gaines is a 78 y.o. female with Past medical history of HTN, COPD, generalized anxiety disorder, depression, and flu few days ago, she was admitted with shortness of breath and syncopal episode, patient improved and she was discharged on 2/16 on steroids and Tamiflu.  Patient stayed well after discharge, suddenly yesterday she had worsening of cough and phlegm production.  Also had fever 104 so she was brought back to the ED.   Fever 104 at home   ED Course: Afebrile temp 98.2, pulse 88, RR 18, BP 91/57 BMP Na 134, K3.2 hypokalemia, creatinine 1.1 slightly elevated Lactic acid 1.5 within normal range Procalcitonin 2.56 elevated Leukocytosis, WBC 17.4 elevated CXR: left hilar and lingular opacity    Assessment and Plan: # HCAP (healthcare-associated pneumonia) Started broad-spectrum antibiotics vancomycin and cefepime Pharmacy consulted for dosing Started Mucinex 600 mg p.o. twice daily, Tussionex prn for cough Supplemental O2 inhalation as needed Blood culture NGTD   # Influenza A infection on 2/15 PCR positive Patient was discharged on prednisone and Tamiflu Continue Tamiflu for 1 day more, to complete the 5-day course  No need of a steroid at this time     # Hypotension 2 L NS bolus given in the ED Monitor BP Held antihypertensive medications for now     # HTN Presented with low blood pressure Held home medications for now Monitor BP and titrate medications accordingly     # Hypokalemia, potassium repleted. # Hypophosphatemia due to nutritional deficiency.  Phos repleted. Monitor electrolytes and replete as needed.     Depression and GAD Continue supportive care Patient is not on any medication at this time     Body mass index is 23.68 kg/m.  Interventions:  Diet: Regular diet DVT Prophylaxis: Subcutaneous Lovenox   Advance goals of care discussion: Full code  Family Communication: family was not present at bedside, at the time of interview.  The pt provided permission to discuss medical plan with the family. Opportunity was given to ask question and all questions were answered satisfactorily.   Disposition:  Pt is from home, admitted with HCAP, still has DOE & shortness of breath, which precludes a safe discharge. Discharge to home, when stable, most likely tomorrow a.m..  Subjective: No significant events overnight, patient still has dyspnea exertion and productive cough, does not feel comfortable going home today.  Denies any chest pain or palpitation, no any other active issues.  Physical Exam: General: NAD, lying comfortably Appear in no distress, affect appropriate Eyes: PERRLA ENT: Oral Mucosa Clear, moist  Neck: no JVD,  Cardiovascular: S1 and S2 Present, no Murmur,  Respiratory: Good air entry bilaterally, bilateral crackles, no wheezes. Abdomen: Bowel Sound present, Soft and no tenderness,  Skin: no rashes Extremities: no Pedal edema, no calf tenderness Neurologic: without any new focal findings Gait not checked due to patient safety concerns  Vitals:   01/07/24 1000 01/07/24 1037 01/07/24 1100 01/07/24 1216  BP: 121/62 121/62 (!) 146/66 (!) 144/79  Pulse: 73 73 73 71  Resp:  16  17  Temp:  98.9 F (37.2 C)  98.3 F (36.8 C)  TempSrc:      SpO2: 99% 99% 99% 100%  Weight:  Height:        Intake/Output Summary (Last 24 hours) at 01/07/2024 1401 Last data filed at 01/07/2024 0056 Gross per 24 hour  Intake 609.87 ml  Output --  Net 609.87 ml   Filed Weights   01/06/24 1022  Weight: 55 kg    Data Reviewed: I have personally reviewed and interpreted daily labs, tele strips, imagings as discussed above. I reviewed all nursing notes, pharmacy notes,  vitals, pertinent old records I have discussed plan of care as described above with RN and patient/family.  CBC: Recent Labs  Lab 01/03/24 0739 01/04/24 0621 01/06/24 1027 01/07/24 0245  WBC 9.3 4.4 17.4* 16.4*  HGB 13.0 11.7* 13.2 11.1*  HCT 39.4 35.5* 39.2 32.9*  MCV 83.7 83.7 82.9 81.6  PLT 211 189 243 220   Basic Metabolic Panel: Recent Labs  Lab 01/03/24 0739 01/04/24 0621 01/06/24 1027 01/07/24 0245  NA 135 141 134* 139  K 3.8 3.7 3.2* 4.2  CL 99 110 98 109  CO2 23 25 23 22   GLUCOSE 120* 122* 115* 89  BUN 16 10 16 10   CREATININE 0.98 0.52 1.10* 0.58  CALCIUM 9.2 9.2 9.3 8.6*  MG  --   --  2.0 2.1  PHOS  --   --  1.8* 3.7    Studies: No results found.  Scheduled Meds:  enoxaparin (LOVENOX) injection  40 mg Subcutaneous Q24H   fluticasone furoate-vilanterol  1 puff Inhalation Daily   guaiFENesin  600 mg Oral BID   oseltamivir  30 mg Oral BID   pantoprazole  40 mg Oral Daily   sodium chloride flush  3 mL Intravenous Q12H   sodium chloride flush  3 mL Intravenous Q12H   Continuous Infusions:  sodium chloride     ceFEPime (MAXIPIME) IV Stopped (01/07/24 1114)   PRN Meds: sodium chloride, acetaminophen **OR** acetaminophen, chlorpheniramine-HYDROcodone, ipratropium-albuterol, ondansetron **OR** ondansetron (ZOFRAN) IV, sodium chloride flush  Time spent: 40 minutes  Author: Gillis Santa. MD [facility] 01/07/2024 2:01 PM  To reach On-call, see care teams to locate the attending and reach out to them via [email or web address]. If 7PM-7AM, please contact night-coverage If you still have difficulty reaching the attending provider, please page the [facility] (Director on Call) for [facility] on amion for assistance.

## 2024-01-07 NOTE — ED Notes (Signed)
 Assumed patient care and received report from the previous nurse.

## 2024-01-08 ENCOUNTER — Other Ambulatory Visit: Payer: Self-pay

## 2024-01-08 DIAGNOSIS — J189 Pneumonia, unspecified organism: Secondary | ICD-10-CM | POA: Diagnosis not present

## 2024-01-08 LAB — CBC
HCT: 34.6 % — ABNORMAL LOW (ref 36.0–46.0)
Hemoglobin: 11.9 g/dL — ABNORMAL LOW (ref 12.0–15.0)
MCH: 28.1 pg (ref 26.0–34.0)
MCHC: 34.4 g/dL (ref 30.0–36.0)
MCV: 81.8 fL (ref 80.0–100.0)
Platelets: 242 10*3/uL (ref 150–400)
RBC: 4.23 MIL/uL (ref 3.87–5.11)
RDW: 13.4 % (ref 11.5–15.5)
WBC: 10.8 10*3/uL — ABNORMAL HIGH (ref 4.0–10.5)
nRBC: 0 % (ref 0.0–0.2)

## 2024-01-08 LAB — MAGNESIUM: Magnesium: 2.2 mg/dL (ref 1.7–2.4)

## 2024-01-08 LAB — BASIC METABOLIC PANEL WITH GFR
Anion gap: 12 (ref 5–15)
BUN: 9 mg/dL (ref 8–23)
CO2: 23 mmol/L (ref 22–32)
Calcium: 9 mg/dL (ref 8.9–10.3)
Chloride: 104 mmol/L (ref 98–111)
Creatinine, Ser: 0.67 mg/dL (ref 0.44–1.00)
GFR, Estimated: >60 mL/min
Glucose, Bld: 86 mg/dL (ref 70–99)
Potassium: 3.7 mmol/L (ref 3.5–5.1)
Sodium: 139 mmol/L (ref 135–145)

## 2024-01-08 LAB — PHOSPHORUS: Phosphorus: 3 mg/dL (ref 2.5–4.6)

## 2024-01-08 MED ORDER — CEFUROXIME AXETIL 500 MG PO TABS
500.0000 mg | ORAL_TABLET | Freq: Two times a day (BID) | ORAL | 0 refills | Status: AC
  Filled 2024-01-08: qty 10, 5d supply, fill #0

## 2024-01-08 MED ORDER — CEFUROXIME AXETIL 500 MG PO TABS
500.0000 mg | ORAL_TABLET | Freq: Two times a day (BID) | ORAL | Status: DC
  Filled 2024-01-08: qty 1

## 2024-01-08 MED ORDER — LOSARTAN POTASSIUM 50 MG PO TABS: 50.0000 mg | ORAL_TABLET | Freq: Every day | ORAL | Status: DC

## 2024-01-08 MED ORDER — GUAIFENESIN ER 600 MG PO TB12
600.0000 mg | ORAL_TABLET | Freq: Two times a day (BID) | ORAL | 0 refills | Status: AC
  Filled 2024-01-08: qty 10, 5d supply, fill #0

## 2024-01-08 MED ORDER — DEXTROMETHORPHAN-GUAIFENESIN 20-200 MG/20ML PO LIQD
10.0000 mL | Freq: Four times a day (QID) | ORAL | 0 refills | Status: AC | PRN
  Filled 2024-01-08: qty 118, 5d supply, fill #0

## 2024-01-08 MED ORDER — AMLODIPINE BESYLATE 5 MG PO TABS
5.0000 mg | ORAL_TABLET | Freq: Every day | ORAL | Status: DC
  Administered 2024-01-08: 5 mg via ORAL
  Filled 2024-01-08: qty 1

## 2024-01-08 NOTE — Care Management Important Message (Signed)
 Important Message  Patient Details  Name: Sonya Gaines MRN: [id number] Date of Birth: 28-Feb-1946   Important Message Given:  Yes - Medicare IM     Cristela Blue, CMA 01/08/2024, 10:28 AM

## 2024-01-08 NOTE — Discharge Summary (Signed)
 [facility] Discharge Summary   Patient: Sonya Gaines ZOX:[id number]  PCP: Barbette Reichmann, MD  Date of admission: 01/06/2024   Date of discharge: 01/08/2024 01/08/2024     Discharge Diagnoses:  Principal Problem:   HCAP (healthcare-associated pneumonia)   Admitted From: Home Disposition: Home  Recommendations for Outpatient Follow-up:  Follow-up with PCP in 1 week, repeat chest x-ray in 4 weeks for resolution of pneumonia.  Monitor BP at home and follow with PCP to titrate medication accordingly. Follow up LABS/TEST:  CXR in 4 wks   Follow-up Information     Hande, Vishwanath, MD Follow up in 1 week(s).   Specialty: Internal Medicine Why: Office closed due to weather patient to make own follow up appt Contact information: [address] [place] [address] [phone]                Diet recommendation: Cardiac diet  Activity: The patient is advised to gradually reintroduce usual activities, as tolerated  Discharge Condition: stable  Code Status: Full code   History of present illness: As per the H and P dictated on admission Hospital Course:   Sonya Gaines is a 78 y.o. female with Past medical history of HTN, COPD, generalized anxiety disorder, depression, and flu few days ago, she was admitted with shortness of breath and syncopal episode, patient improved and she was discharged on 2/16 on steroids and Tamiflu.  Patient stayed well after discharge, suddenly yesterday she had worsening of cough and phlegm production.  Also had fever 104 so she was brought back to the ED. Fever 104 at home ED Course: Afebrile temp 98.2, pulse 88, RR 18, BP 91/57 BMP Na 134, K3.2 hypokalemia, creatinine 1.1 slightly elevated Lactic acid 1.5 within normal range Procalcitonin 2.56 elevated Leukocytosis, WBC 17.4 elevated CXR: left hilar and lingular opacity   Assessment and Plan: # HCAP (healthcare-associated pneumonia) S/p vancomycin and cefepime, Pharmacy  consulted for dosing S/p Mucinex 600 mg p.o. twice daily, Tussionex prn for cough Supplemental O2 inhalation as needed. Blood culture NGTD Patient was discharged on Ceftin 500 mg p.o. twice daily for 5 days.  Robitussin DM as needed for cough. # Influenza A infection on 2/15 PCR positive Patient was discharged on prednisone and Tamiflu. Continued Tamiflu for 1 day more, to complete the 5-day course. No need of a steroid at this time # Hypotension, resolved s/p 2 L NS bolus given in the ED Held antihypertensive medications during hospital stay. # HTN: Presented with low blood pressure.  NS bolus given, blood pressure improved.  Home medications amlodipine 5 mg daily and losartan 50 mg p.o. daily resumed on discharge.  Patient was advised to monitor BP at home and follow with PCP to titrate medication accordingly. # Hypokalemia, potassium repleted.  Resolved # Hypophosphatemia due to nutritional deficiency.  Phos repleted.  Resolved # Depression and GAD: Patient is not on any medication at this time # Generalized weakness, calcium slightly low due to hypoalbuminemia.  Vitamin D within normal range.   Body mass index is 23.68 kg/m.  Nutrition Interventions:  Patient was ambulatory without any assistance. On the day of the discharge the patient's vitals were stable, and no other acute medical condition were reported by patient. the patient was felt safe to be discharge at Home.  Consultants: None Procedures: None  Discharge Exam: General: Appear in no distress, no Rash; Oral Mucosa Clear, moist. Cardiovascular: S1 and S2 Present, no Murmur, Respiratory: normal respiratory effort, Bilateral Air entry present and no  Crackles, no wheezes Abdomen: Bowel Sound present, Soft and no tenderness, no hernia Extremities: no Pedal edema, no calf tenderness Neurology: alert and oriented to time, place, and person affect appropriate.  Filed Weights   01/06/24 1022  Weight: 55 kg   Vitals:    01/07/24 1950 01/08/24 0817  BP: 138/71 (!) 161/82  Pulse: 66 70  Resp:  15  Temp: 98.3 F (36.8 C) 98.2 F (36.8 C)  SpO2: 96% 98%    DISCHARGE MEDICATION: Allergies as of 01/08/2024       Reactions   Apraclonidine Rash   Lifitegrast Rash, Swelling   Eye swelling   Cheese Other (See Comments)   Sinus congestion   Chocolate Flavoring Agent (non-screening) Other (See Comments)   Sinus congestion   Milk (cow) Other (See Comments)   Sinus congestion   Brimonidine Rash   Clarithromycin Rash   Clindamycin/lincomycin Swelling, Rash   Levofloxacin Rash   Nickel Rash   Penicillins Rash   Polytrim [polymyxin B-trimethoprim] Rash   Statins Rash   Sulfamethoxazole Rash   Sulfonamide Derivatives Rash   Tobramycin Rash        Medication List     STOP taking these medications    brinzolamide 1 % ophthalmic suspension Commonly known as: AZOPT   citalopram 10 MG tablet Commonly known as: CELEXA   dorzolamide-timolol 2-0.5 % ophthalmic solution Commonly known as: COSOPT   Ferrex 150 150 MG capsule Generic drug: iron polysaccharides   guaiFENesin-dextromethorphan 100-10 MG/5ML syrup Commonly known as: ROBITUSSIN DM Replaced by: Robafen DM 20-200 MG/20ML Liqd   oseltamivir 30 MG capsule Commonly known as: Tamiflu   predniSONE 20 MG tablet Commonly known as: DELTASONE   Rhopressa 0.02 % Soln Generic drug: Netarsudil Dimesylate       TAKE these medications    albuterol 108 (90 Base) MCG/ACT inhaler Commonly known as: VENTOLIN HFA Inhale 2 puffs into the lungs every 6 (six) hours as needed for wheezing or shortness of breath.   amLODipine 5 MG tablet Commonly known as: NORVASC Take 5 mg by mouth daily.   budesonide-formoterol 160-4.5 MCG/ACT inhaler Commonly known as: SYMBICORT Inhale 2 puffs into the lungs 2 (two) times daily.   calcium carbonate 1500 (600 Ca) MG Tabs tablet Commonly known as: OSCAL Take 1,500 mg by mouth daily.   cefUROXime 500 MG  tablet Commonly known as: CEFTIN Take 1 tablet (500 mg total) by mouth 2 (two) times daily for 5 days.   cyanocobalamin 1000 MCG tablet Commonly known as: VITAMIN B12 Take 1,000 mcg by mouth daily.   esomeprazole 40 MG capsule Commonly known as: NEXIUM Take 40 mg by mouth daily.   ezetimibe 10 MG tablet Commonly known as: ZETIA Take 10 mg by mouth daily.   FT Mucus Relief 12HR 600 MG 12 hr tablet Generic drug: guaiFENesin Take 1 tablet (600 mg total) by mouth 2 (two) times daily for 5 days.   latanoprost 0.005 % ophthalmic solution Commonly known as: XALATAN Place 1 drop into the right eye at bedtime.   losartan 50 MG tablet Commonly known as: COZAAR Take 50 mg by mouth daily.   montelukast 10 MG tablet Commonly known as: SINGULAIR Take 10 mg by mouth at bedtime.   PreserVision AREDS Tabs Take 1 tablet by mouth 2 (two) times daily.   Robafen DM 20-200 MG/20ML Liqd Generic drug: Dextromethorphan-guaiFENesin Take 10 mLs by mouth every 6 (six) hours as needed. Replaces: guaiFENesin-dextromethorphan 100-10 MG/5ML syrup   SYSTANE COMPLETE PF OP Apply  1 drop to eye 2 (two) times daily.   timolol 0.5 % ophthalmic solution Commonly known as: TIMOPTIC Place 1 drop into the right eye 2 (two) times daily.       Allergies  Allergen Reactions   Apraclonidine Rash   Lifitegrast Rash and Swelling    Eye swelling   Cheese Other (See Comments)    Sinus congestion   Chocolate Flavoring Agent (Non-Screening) Other (See Comments)    Sinus congestion   Milk (Cow) Other (See Comments)    Sinus congestion    Brimonidine Rash   Clarithromycin Rash   Clindamycin/Lincomycin Swelling and Rash   Levofloxacin Rash   Nickel Rash   Penicillins Rash   Polytrim [Polymyxin B-Trimethoprim] Rash   Statins Rash   Sulfamethoxazole Rash   Sulfonamide Derivatives Rash   Tobramycin Rash   Discharge Instructions     Call MD for:  difficulty breathing, headache or visual disturbances    Complete by: As directed    Call MD for:  extreme fatigue   Complete by: As directed    Call MD for:  persistant dizziness or light-headedness   Complete by: As directed    Call MD for:  persistant nausea and vomiting   Complete by: As directed    Call MD for:  severe uncontrolled pain   Complete by: As directed    Call MD for:  temperature >100.4   Complete by: As directed    Diet - low sodium heart healthy   Complete by: As directed    Discharge instructions   Complete by: As directed    Follow-up with PCP in 1 week, repeat chest x-ray in 4 weeks for resolution of pneumonia.  Monitor BP at home and follow with PCP to titrate medication accordingly.   Increase activity slowly   Complete by: As directed        The results of significant diagnostics from this hospitalization (including imaging, microbiology, ancillary and laboratory) are listed below for reference.    Significant Diagnostic Studies: DG Chest 2 View Result Date: 01/06/2024 CLINICAL DATA:  Cough. EXAM: CHEST - 2 VIEW COMPARISON:  Chest radiograph dated 01/03/2024. FINDINGS: Left perihilar and lingular opacity most consistent with pneumonia. Clinical correlation and follow-up to resolution recommended. The right lung is clear. There is no pleural effusion or pneumothorax. The cardiac silhouette is within normal limits. No acute osseous pathology. IMPRESSION: Left perihilar and lingular pneumonia. Electronically Signed   By: Elgie Collard M.D.   On: 01/06/2024 13:18   DG Chest 2 View Result Date: 01/03/2024 CLINICAL DATA:  Cough.  Weakness and falls. EXAM: CHEST - 2 VIEW COMPARISON:  10/22/2021 FINDINGS: Normal heart size. No pleural fluid, interstitial edema, or airspace consolidation. The visualized osseous structures are unremarkable. IMPRESSION: No acute cardiopulmonary disease. Electronically Signed   By: Signa Kell M.D.   On: 01/03/2024 09:47    Microbiology: Recent Results (from the past 240 hours)  Resp  panel by RT-PCR (RSV, Flu A&B, Covid) Anterior Nasal Swab     Status: Abnormal   Collection Time: 01/03/24  8:50 AM   Specimen: Anterior Nasal Swab  Result Value Ref Range Status   SARS Coronavirus 2 by RT PCR NEGATIVE NEGATIVE Final    Comment: (NOTE) SARS-CoV-2 target nucleic acids are NOT DETECTED.  The SARS-CoV-2 RNA is generally detectable in upper respiratory specimens during the acute phase of infection. The lowest concentration of SARS-CoV-2 viral copies this assay can detect is 138 copies/mL. A negative result does  not preclude SARS-Cov-2 infection and should not be used as the sole basis for treatment or other patient management decisions. A negative result may occur with  improper specimen collection/handling, submission of specimen other than nasopharyngeal swab, presence of viral mutation(s) within the areas targeted by this assay, and inadequate number of viral copies(<138 copies/mL). A negative result must be combined with clinical observations, patient history, and epidemiological information. The expected result is Negative.  Fact Sheet for Patients:  BloggerCourse.com  Fact Sheet for Healthcare Providers:  SeriousBroker.it  This test is no t yet approved or cleared by the [place] FDA and  has been authorized for detection and/or diagnosis of SARS-CoV-2 by FDA under an Emergency Use Authorization (EUA). This EUA will remain  in effect (meaning this test can be used) for the duration of the COVID-19 declaration under Section 564(b)(1) of the Act, 21 U.S.C.section 360bbb-3(b)(1), unless the authorization is terminated  or revoked sooner.       Influenza A by PCR POSITIVE (A) NEGATIVE Final   Influenza B by PCR NEGATIVE NEGATIVE Final    Comment: (NOTE) The Xpert Xpress SARS-CoV-2/FLU/RSV plus assay is intended as an aid in the diagnosis of influenza from Nasopharyngeal swab specimens and should not be used  as a sole basis for treatment. Nasal washings and aspirates are unacceptable for Xpert Xpress SARS-CoV-2/FLU/RSV testing.  Fact Sheet for Patients: BloggerCourse.com  Fact Sheet for Healthcare Providers: SeriousBroker.it  This test is not yet approved or cleared by the [place] FDA and has been authorized for detection and/or diagnosis of SARS-CoV-2 by FDA under an Emergency Use Authorization (EUA). This EUA will remain in effect (meaning this test can be used) for the duration of the COVID-19 declaration under Section 564(b)(1) of the Act, 21 U.S.C. section 360bbb-3(b)(1), unless the authorization is terminated or revoked.     Resp Syncytial Virus by PCR NEGATIVE NEGATIVE Final    Comment: (NOTE) Fact Sheet for Patients: BloggerCourse.com  Fact Sheet for Healthcare Providers: SeriousBroker.it  This test is not yet approved or cleared by the [place] FDA and has been authorized for detection and/or diagnosis of SARS-CoV-2 by FDA under an Emergency Use Authorization (EUA). This EUA will remain in effect (meaning this test can be used) for the duration of the COVID-19 declaration under Section 564(b)(1) of the Act, 21 U.S.C. section 360bbb-3(b)(1), unless the authorization is terminated or revoked.  Performed at [facility], [address] [place], [place] [address]   Blood Culture (routine x 2)     Status: None (Preliminary result)   Collection Time: 01/06/24  1:23 PM   Specimen: BLOOD  Result Value Ref Range Status   Specimen Description BLOOD BLOOD RIGHT ARM  Final   Special Requests   Final    BOTTLES DRAWN AEROBIC AND ANAEROBIC Blood Culture results may not be optimal due to an inadequate volume of blood received in culture bottles   Culture   Final    NO GROWTH 2 DAYS Performed at [facility], [address] [place], [place] [address]     Report Status PENDING  Incomplete  Blood Culture (routine x 2)     Status: None (Preliminary result)   Collection Time: 01/06/24  1:23 PM   Specimen: BLOOD  Result Value Ref Range Status   Specimen Description BLOOD BLOOD RIGHT WRIST  Final   Special Requests   Final    BOTTLES DRAWN AEROBIC AND ANAEROBIC Blood Culture results may not be optimal due to an  inadequate volume of blood received in culture bottles   Culture   Final    NO GROWTH 2 DAYS Performed at [facility], [address] [place], [place] [address]    Report Status PENDING  Incomplete  MRSA Next Gen by PCR, Nasal     Status: None   Collection Time: 01/07/24  3:14 AM   Specimen: Nasal Mucosa; Nasal Swab  Result Value Ref Range Status   MRSA by PCR Next Gen NOT DETECTED NOT DETECTED Final    Comment: (NOTE) The GeneXpert MRSA Assay (FDA approved for NASAL specimens only), is one component of a comprehensive MRSA colonization surveillance program. It is not intended to diagnose MRSA infection nor to guide or monitor treatment for MRSA infections. Test performance is not FDA approved in patients less than 6 years old. Performed at [facility], [address] [place], [place] [address]      Labs: CBC: Recent Labs  Lab 01/03/24 [phone] 01/04/24 0621 01/06/24 1027 01/07/24 0245 01/08/24 0509  WBC 9.3 4.4 17.4* 16.4* 10.8*  HGB 13.0 11.7* 13.2 11.1* 11.9*  HCT 39.4 35.5* 39.2 32.9* 34.6*  MCV 83.7 83.7 82.9 81.6 81.8  PLT 211 189 243 220 242   Basic Metabolic Panel: Recent Labs  Lab 01/03/24 0739 01/04/24 0621 01/06/24 1027 01/07/24 0245 01/08/24 0509  NA 135 141 134* 139 139  K 3.8 3.7 3.2* 4.2 3.7  CL 99 110 98 109 104  CO2 23 25 23 22 23   GLUCOSE 120* 122* 115* 89 86  BUN 16 10 16 10 9   CREATININE 0.98 0.52 1.10* 0.58 0.67  CALCIUM 9.2 9.2 9.3 8.6* 9.0  MG  --   --  2.0 2.1 2.2  PHOS  --   --  1.8* 3.7 3.0   Liver Function Tests: Recent Labs  Lab 01/06/24 1027  AST  43*  ALT 27  ALKPHOS 66  BILITOT 0.5  PROT 6.9  ALBUMIN 3.4*   No results for input(s): "LIPASE", "AMYLASE" in the last 168 hours. No results for input(s): "AMMONIA" in the last 168 hours. Cardiac Enzymes: No results for input(s): "CKTOTAL", "CKMB", "CKMBINDEX", "TROPONINI" in the last 168 hours. BNP (last 3 results) No results for input(s): "BNP" in the last 8760 hours. CBG: No results for input(s): "GLUCAP" in the last 168 hours.  Time spent: 35 minutes  Signed:  Gillis Santa  [facility] 01/08/2024  10:45 AM

## 2024-01-08 NOTE — Plan of Care (Signed)

## 2024-01-10 ENCOUNTER — Emergency Department
Admission: EM | Admit: 2024-01-10 | Discharge: 2024-01-10 | Disposition: A | Discharge status: Home / Self Care | Payer: Medicare PPO | Attending: Emergency Medicine | Admitting: Emergency Medicine

## 2024-01-10 ENCOUNTER — Other Ambulatory Visit: Payer: Self-pay

## 2024-01-10 ENCOUNTER — Emergency Department: Payer: Medicare PPO

## 2024-01-10 DIAGNOSIS — I82611 Acute embolism and thrombosis of superficial veins of right upper extremity: Secondary | ICD-10-CM | POA: Diagnosis not present

## 2024-01-10 DIAGNOSIS — J449 Chronic obstructive pulmonary disease, unspecified: Secondary | ICD-10-CM | POA: Insufficient documentation

## 2024-01-10 DIAGNOSIS — I808 Phlebitis and thrombophlebitis of other sites: Secondary | ICD-10-CM

## 2024-01-10 DIAGNOSIS — M79601 Pain in right arm: Secondary | ICD-10-CM | POA: Diagnosis present

## 2024-01-10 DIAGNOSIS — I10 Essential (primary) hypertension: Secondary | ICD-10-CM | POA: Insufficient documentation

## 2024-01-10 NOTE — ED Provider Notes (Signed)
 [facility] Provider Note    Event Date/Time   First MD Initiated Contact with Patient 01/10/24 1128     (approximate)   History   Hematoma   HPI  Sonya Gaines is a 78 y.o. female with history of COPD, hypertension who comes in with concerns for right arm pain.  On review of records patient was admitted on 01/03/2024 due to the flu.  She was readmitted on 2/18 with hospital care associated pneumonia.  She states that she had multiple IVs and on her right arm she noticed a small bump near where the IV was placed on the back of her right arm.  She also noticed a bump in her [facility] area with some increased bruising.  She states that she did not notice these previously until yesterday and she was worried about a blood clot.  She denies any other new symptoms has no numbness, able to move her hand, does not feel cold.  Physical Exam   Triage Vital Signs: ED Triage Vitals  Encounter Vitals Group     BP 01/10/24 1121 (!) 155/77     Systolic BP Percentile --      Diastolic BP Percentile --      Pulse Rate 01/10/24 1121 86     Resp 01/10/24 1121 16     Temp 01/10/24 1121 97.6 F (36.4 C)     Temp Source 01/10/24 1121 Oral     SpO2 01/10/24 1121 100 %     Weight 01/10/24 1121 119 lb (54 kg)     Height 01/10/24 1121 5' (1.524 m)     Head Circumference --      Peak Flow --      Pain Score 01/10/24 1119 0     Pain Loc --      Pain Education --      Exclude from Growth Chart --     Most recent vital signs: Vitals:   01/10/24 1121  BP: (!) 155/77  Pulse: 86  Resp: 16  Temp: 97.6 F (36.4 C)  SpO2: 100%     General: Awake, no distress.  CV:  Good peripheral perfusion.  Resp:  Normal effort.  Abd:  No distention.  Other:  Good radial, ulnar pulses.  Normal cap refill.  Finger movements are intact sensation intact.  She has a small bruise noted on the back of the right forearm with a little bit of a superficial knot felt slight redness noted.  She also  has some bruising noted in her right [facility] with a superficial knot felt with palpation.   ED Results / Procedures / Treatments   Labs (all labs ordered are listed, but only abnormal results are displayed) Labs Reviewed - No data to display  I did review patient's blood work from 2 days ago where her white count was 10.8 downtrending.  Her hemoglobin was stable at 11.9.  And her BMP had a normal creatinine.   RADIOLOGY I have reviewed the ultrasound personally and interpreted and no dvt  PROCEDURES:  Critical Care performed: No  Procedures   MEDICATIONS ORDERED IN ED: Medications - No data to display   IMPRESSION / MDM / ASSESSMENT AND PLAN / ED COURSE  I reviewed the triage vital signs and the nursing notes.   Patient's presentation is most consistent with acute presentation with potential threat to life or bodily function.   Differential includes superficial thrombophlebitis, DVT.  Patient is afebrile well-appearing there is no redness  to suggest cellulitis, abscess.  Could be just some hematomas/scar tissue but patient states that they just presented this morning which had her worried.  We discussed getting an ultrasound to rule out any deeper DVT and discussed management for superficial thrombophlebitis.  Patient expressed understanding and felt comfortable with that plan  IMPRESSION: 1. No evidence of DVT within the right upper extremity. 2. Superficial venous thrombosis within the cephalic vein at the level of the distal forearm. This is reportedly at site of recent prior IV.  Given the thrombophlebitis is isolated and confined to the distal cephalic vein there is no significant swelling and this is secondary to local trauma from IV infiltration I discussed with patient that the risk for anticoagulation would be greater than the benefit.  We discussed conservative treatment with NSAIDs, compression with warm compresses, elevating the arm and following up with the primary  care doctor in 7 to 10 days for a recheck.  If swelling gets worse then she always return to the ER sooner for repeat ultrasound here.  Patient expressed understanding and felt comfortable with this plan     FINAL CLINICAL IMPRESSION(S) / ED DIAGNOSES   Final diagnoses:  Superficial thrombophlebitis of right upper extremity     Rx / DC Orders   ED Discharge Orders     None        Note:  This document was prepared using Dragon voice recognition software and may include unintentional dictation errors.   Concha Se, MD 01/10/24 [phone]

## 2024-01-10 NOTE — ED Notes (Signed)
 Patient declined discharge vital signs.

## 2024-01-10 NOTE — Discharge Instructions (Addendum)
 You can take ibuprofen 400 every 6-8 hours to help with pain. Use warm compresses.  Keep the area elevated Follow-up with your primary care doctor in 7 to 10 days and consider potential repeat ultrasound to make sure no progression.  Return to the ER if develop worsening redness, fevers, swelling or any other concerns   1. No evidence of DVT within the right upper extremity.  2. Superficial venous thrombosis within the cephalic vein at the  level of the distal forearm. This is reportedly at site of recent  prior IV.

## 2024-01-10 NOTE — ED Triage Notes (Signed)
 Pt to ED with family member for hematoma to anterior R arm around [facility] area and smaller hematoma to lower R arm, both places which had PIV when she was admitted here 1 week ago. Both areas appear to have normal bruising, the upper area with more bruising. Pt states she wanted to make sure she didn't have a blood clot. No redness noted. Hematomas are superficial and localized.

## 2024-01-11 LAB — CULTURE, BLOOD (ROUTINE X 2)
Culture: NO GROWTH
Culture: NO GROWTH

## 2024-04-06 ENCOUNTER — Emergency Department
Admission: EM | Admit: 2024-04-06 | Discharge: 2024-04-06 | Disposition: A | Discharge status: Home / Self Care | Attending: Emergency Medicine | Admitting: Emergency Medicine

## 2024-04-06 ENCOUNTER — Emergency Department

## 2024-04-06 ENCOUNTER — Other Ambulatory Visit: Payer: Self-pay

## 2024-04-06 DIAGNOSIS — Z79899 Other long term (current) drug therapy: Secondary | ICD-10-CM | POA: Diagnosis not present

## 2024-04-06 DIAGNOSIS — J449 Chronic obstructive pulmonary disease, unspecified: Secondary | ICD-10-CM | POA: Insufficient documentation

## 2024-04-06 DIAGNOSIS — I10 Essential (primary) hypertension: Secondary | ICD-10-CM | POA: Diagnosis not present

## 2024-04-06 DIAGNOSIS — F419 Anxiety disorder, unspecified: Secondary | ICD-10-CM | POA: Diagnosis not present

## 2024-04-06 DIAGNOSIS — H538 Other visual disturbances: Secondary | ICD-10-CM | POA: Diagnosis present

## 2024-04-06 LAB — COMPREHENSIVE METABOLIC PANEL WITH GFR
ALT: 21 U/L (ref 0–44)
AST: 21 U/L (ref 15–41)
Albumin: 4.2 g/dL (ref 3.5–5.0)
Alkaline Phosphatase: 79 U/L (ref 38–126)
Anion gap: 9 (ref 5–15)
BUN: 15 mg/dL (ref 8–23)
CO2: 28 mmol/L (ref 22–32)
Calcium: 9.8 mg/dL (ref 8.9–10.3)
Chloride: 103 mmol/L (ref 98–111)
Creatinine, Ser: 0.57 mg/dL (ref 0.44–1.00)
GFR, Estimated: >60 mL/min (ref >60)
Glucose, Bld: 97 mg/dL (ref 70–99)
Potassium: 3.9 mmol/L (ref 3.5–5.1)
Sodium: 140 mmol/L (ref 135–145)
Total Bilirubin: 0.7 mg/dL (ref 0.0–1.2)
Total Protein: 7 g/dL (ref 6.5–8.1)

## 2024-04-06 LAB — CBC WITH DIFFERENTIAL/PLATELET
Abs Immature Granulocytes: 0.01 10*3/uL (ref 0.00–0.07)
Basophils Absolute: 0.1 10*3/uL (ref 0.0–0.1)
Basophils Relative: 1 %
Eosinophils Absolute: 0.1 10*3/uL (ref 0.0–0.5)
Eosinophils Relative: 2 %
HCT: 40.1 % (ref 36.0–46.0)
Hemoglobin: 13 g/dL (ref 12.0–15.0)
Immature Granulocytes: 0 %
Lymphocytes Relative: 31 %
Lymphs Abs: 1.8 10*3/uL (ref 0.7–4.0)
MCH: 27.4 pg (ref 26.0–34.0)
MCHC: 32.4 g/dL (ref 30.0–36.0)
MCV: 84.4 fL (ref 80.0–100.0)
Monocytes Absolute: 0.3 10*3/uL (ref 0.1–1.0)
Monocytes Relative: 5 %
Neutro Abs: 3.4 10*3/uL (ref 1.7–7.7)
Neutrophils Relative %: 61 %
Platelets: 207 10*3/uL (ref 150–400)
RBC: 4.75 MIL/uL (ref 3.87–5.11)
RDW: 13.9 % (ref 11.5–15.5)
WBC: 5.6 10*3/uL (ref 4.0–10.5)
nRBC: 0 % (ref 0.0–0.2)

## 2024-04-06 LAB — TROPONIN I (HIGH SENSITIVITY)
Troponin I (High Sensitivity): 3 ng/L (ref <18)
Troponin I (High Sensitivity): 3 ng/L (ref <18)

## 2024-04-06 MED ORDER — LOSARTAN POTASSIUM 50 MG PO TABS
25.0000 mg | ORAL_TABLET | Freq: Once | ORAL | Status: AC
Start: 2024-04-06 — End: 2024-04-06
  Administered 2024-04-06: 25 mg via ORAL
  Filled 2024-04-06: qty 1

## 2024-04-06 MED ORDER — CITALOPRAM HYDROBROMIDE 10 MG PO TABS: 10.0000 mg | ORAL_TABLET | Freq: Every day | ORAL | 2 refills | Status: AC

## 2024-04-06 NOTE — ED Notes (Signed)
 See triage notes. Patient c/o hypertension. Patient is on medication but she stated that her PCP has been adjusting it. Patient is also under some stress.

## 2024-04-06 NOTE — Discharge Instructions (Signed)
 Follow-up with your regular doctor.  Please call for an appointment.  Return emergency department if worsening

## 2024-04-06 NOTE — ED Provider Notes (Signed)
 [facility] Provider Note    Event Date/Time   First MD Initiated Contact with Patient 04/06/24 [phone]     (approximate)   History   Hypertension   HPI  Sonya Gaines is a 78 y.o. female history of hypertension, osteoporosis, COPD, macular degeneration, heart murmur presents with elevated blood pressure.  Patient states she had some blurred vision this morning, has gotten better since she is here in the ED.  Also has some chest tightness but then it went away.  Has had several episodes of this at home.  Denies actual chest pain at this moment.  No slurred speech numbness or tingling.  Patient states she takes losartan  and amlodipine .  Takes 25 of losartan  at night and 5 of amlodipine  in the mornings.  Did take her medication today.  Denies swelling in legs.  No recent cold medications.       Physical Exam   Triage Vital Signs: ED Triage Vitals [04/06/24 0844]  Encounter Vitals Group     BP (!) 201/79     Systolic BP Percentile      Diastolic BP Percentile      Pulse Rate 71     Resp 18     Temp 98.1 F (36.7 C)     Temp Source Oral     SpO2 98 %     Weight 125 lb (56.7 kg)     Height 5' (1.524 m)     Head Circumference      Peak Flow      Pain Score      Pain Loc      Pain Education      Exclude from Growth Chart     Most recent vital signs: Vitals:   04/06/24 1244 04/06/24 1252  BP: (!) 144/75   Pulse: 65   Resp: 18   Temp:  98.1 F (36.7 C)  SpO2: 97%      General: Awake, no distress.   CV:  Good peripheral perfusion. regular rate and  rhythm Resp:  Normal effort. Lungs CTA Abd:  No distention.   Other:  Cranial nerves II through XII grossly intact, grips equal bilaterally   ED Results / Procedures / Treatments   Labs (all labs ordered are listed, but only abnormal results are displayed) Labs Reviewed  COMPREHENSIVE METABOLIC PANEL WITH GFR  CBC WITH DIFFERENTIAL/PLATELET  TROPONIN I (HIGH SENSITIVITY)  TROPONIN I (HIGH  SENSITIVITY)     EKG  EKG   RADIOLOGY CT head    PROCEDURES:   Procedures  Critical Care:  no Chief Complaint  Patient presents with   Hypertension      MEDICATIONS ORDERED IN ED: Medications  losartan  (COZAAR ) tablet 25 mg (25 mg Oral Given 04/06/24 1004)     IMPRESSION / MDM / ASSESSMENT AND PLAN / ED COURSE  I reviewed the triage vital signs and the nursing notes.                              Differential diagnosis includes, but is not limited to, CVA, SAH, TIA, MI, uncontrolled hypertension  Patient's presentation is most consistent with acute presentation with potential threat to life or bodily function.   Cardiac monitor no Medications given: Losartan  25 mg p.o.  Patient's labs are reassuring  EKG shows normal sinus rhythm, no significant change compared to older EKGs, see physician read  Chest x-ray independently reviewed interpreted by  me as being negative for any acute abnormality  CT of the head for TIA  CT of the head was independently reviewed interpreted by me by looking at images and reviewing radiologist report as being negative for any acute abnormality  Did consider admission, however the patient has no red flags and has improved with medication.  I did explain these findings to patient.  Her blood pressure improved with the extra 25 of losartan .  Did explain to her that she may need to go back to the 50 mg of losartan  instead of 25.  However she is to monitor it closely and if her blood pressure is dropping too low go back to 25 mg.  Follow-up with her regular doctor.  She also was asking if she could start back on her citalopram  as she has been executor of her mother's Will and is having a lot of anxiety and stress related to this.  His medication was sent to her pharmacy.  She is in agreement with treatment plan.  Discharged in stable condition.       FINAL CLINICAL IMPRESSION(S) / ED DIAGNOSES   Final diagnoses:  Uncontrolled  hypertension  Anxiety     Rx / DC Orders   ED Discharge Orders          Ordered    citalopram  (CELEXA ) 10 MG tablet  Daily        04/06/24 1443             Note:  This document was prepared using Dragon voice recognition software and may include unintentional dictation errors.    Delsie Figures, PA-C 04/06/24 1445    Ruth Cove, MD 04/06/24 [phone]

## 2024-04-06 NOTE — ED Triage Notes (Signed)
 Pt sts that her BP this AM was 177 systolic this AM. PT sts that she took her morning medication and it has not helped. Pt sts that she went to [facility] and they sent her over for it. Pt sts that her PCP has been changing her BP meds.

## 2024-04-09 ENCOUNTER — Emergency Department
Admission: EM | Admit: 2024-04-09 | Discharge: 2024-04-09 | Disposition: A | Discharge status: Home / Self Care | Attending: Emergency Medicine | Admitting: Emergency Medicine

## 2024-04-09 ENCOUNTER — Other Ambulatory Visit: Payer: Self-pay

## 2024-04-09 DIAGNOSIS — Z79899 Other long term (current) drug therapy: Secondary | ICD-10-CM | POA: Insufficient documentation

## 2024-04-09 DIAGNOSIS — I10 Essential (primary) hypertension: Secondary | ICD-10-CM | POA: Insufficient documentation

## 2024-04-09 NOTE — ED Triage Notes (Addendum)
 Pt comes with concerns for HTN. Pt was just seen here yesterday for same and dc home. Pt does take meds for this. Pt states pcp has made some changes to her BP meds. Pt states they increased her meds. Pt states yesterday it was ok but today it is high again today. Pt denies any cp, sob, dizziness. Pt states some pressure in her head.

## 2024-04-09 NOTE — ED Provider Notes (Signed)
 [facility] Provider Note    Event Date/Time   First MD Initiated Contact with Patient 04/09/24 [phone]     (approximate)   History   Hypertension   HPI  Sonya Gaines is a 78 y.o. female with history of hypertension presenting with high blood pressure.  Patient states that she had her blood pressure medications increased recently, is on 10 mg amlodipine , 50 mg of losartan , did take her morning medications today.  Noted that her blood pressures were elevated this morning.  She denies any thunderclap headache, no new vision changes, no weakness or numbness or slurred speech, no chest pain or shortness of breath.  No lightheadedness or unsteady gait.  States that she felt some tightness to her face but did not note any swelling.  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  On independent chart review, she was seen by her primary care doctor on 20 May, had noted blood pressure readings to systolic 170s.  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  CT head that was done at on 20th May that was negative for acute intracranial pathology.   Physical Exam   Triage Vital Signs: ED Triage Vitals  Encounter Vitals Group     BP 04/09/24 0925 (!) 211/106     Systolic BP Percentile --      Diastolic BP Percentile --      Pulse Rate 04/09/24 0925 82     Resp 04/09/24 0925 18     Temp 04/09/24 0925 98 F (36.7 C)     Temp src --      SpO2 04/09/24 0925 98 %     Weight 04/09/24 0924 125 lb (56.7 kg)     Height 04/09/24 0924 5' (1.524 m)     Head Circumference --      Peak Flow --      Pain Score 04/09/24 0924 0     Pain Loc --      Pain Education --      Exclude from Growth Chart --     Most recent vital signs: Vitals:   04/09/24 0925 04/09/24 0952  BP: (!) 211/106 (!) 190/90  Pulse: 82 79  Resp: 18 17  Temp: 98 F (36.7 C)   SpO2: 98% 100%     General: Awake, no distress.  CV:  Good peripheral perfusion.  Resp:  Normal effort.   Abd:  No distention.  Other:  No focal weakness or numbness, she is steady ambulation, no cranial nerve deficits, right pupil is reactive to light, extraocular movements are intact, left pupil is cloudy, this is her baseline per patient.  No facial swelling or tongue swelling noted.  She has equal radial pulses bilaterally.  No lower extremity edema.   ED Results / Procedures / Treatments   Labs (all labs ordered are listed, but only abnormal results are displayed) Labs Reviewed - No data to display    PROCEDURES:  Critical Care performed: No  Procedures   MEDICATIONS ORDERED IN ED: Medications - No data to display   IMPRESSION / MDM / ASSESSMENT AND PLAN / ED COURSE  I reviewed the triage vital signs and the nursing notes.                              Differential diagnosis includes, but is not limited to, asymptomatic hypertension, no focal neurodeficits  to suggest CVA, no lightheadedness, dizziness, chest pain, shortness of breath to suggest other endorgan damage.  Patient states that she does not want any medications for her facial tightness/mild headache.  States that she can follow-up with her primary care doctor outpatient.  States that she does have history of a heart murmur but does not follow with a cardiologist, I offered cardiology referral and she is amenable to that.  Otherwise considered but no indication for inpatient admission at this time, she is safe for outpatient management.  Repeat blood pressure systolic 190, patient is still asymptomatic.  Will discharge with strict precautions.   Patient's presentation is most consistent with exacerbation of chronic illness.        FINAL CLINICAL IMPRESSION(S) / ED DIAGNOSES   Final diagnoses:  Essential hypertension  Hypertension, unspecified type     Rx / DC Orders   ED Discharge Orders          Ordered    Ambulatory referral to Cardiology       Comments: If you have not heard from the Cardiology office  within the next 72 hours please call [phone].   04/09/24 1016             Note:  This document was prepared using Dragon voice recognition software and may include unintentional dictation errors.    Shane Darling, MD 04/09/24 1017

## 2024-04-09 NOTE — Discharge Instructions (Signed)
 Please make sure to take your blood pressure medications as prescribed.

## 2024-07-26 ENCOUNTER — Other Ambulatory Visit: Payer: Self-pay | Admitting: Internal Medicine

## 2024-07-26 DIAGNOSIS — Z1231 Encounter for screening mammogram for malignant neoplasm of breast: Secondary | ICD-10-CM

## 2024-08-24 ENCOUNTER — Ambulatory Visit
Admission: RE | Admit: 2024-08-24 | Discharge: 2024-08-24 | Disposition: A | Discharge status: Home / Self Care | Source: Ambulatory Visit | Attending: Internal Medicine | Admitting: Internal Medicine

## 2024-08-24 DIAGNOSIS — Z1231 Encounter for screening mammogram for malignant neoplasm of breast: Secondary | ICD-10-CM | POA: Diagnosis present
